# Patient Record
Sex: Female | Born: 1950
Health system: Southern US, Community
[De-identification: ages and names within clinical notes are randomized; demographics above are authoritative.]

## PROBLEM LIST (undated history)

## (undated) DIAGNOSIS — R011 Cardiac murmur, unspecified: Secondary | ICD-10-CM

## (undated) DIAGNOSIS — M810 Age-related osteoporosis without current pathological fracture: Secondary | ICD-10-CM

## (undated) DIAGNOSIS — Z862 Personal history of diseases of the blood and blood-forming organs and certain disorders involving the immune mechanism: Secondary | ICD-10-CM

## (undated) DIAGNOSIS — Z87898 Personal history of other specified conditions: Secondary | ICD-10-CM

## (undated) DIAGNOSIS — M1990 Unspecified osteoarthritis, unspecified site: Secondary | ICD-10-CM

## (undated) DIAGNOSIS — M329 Systemic lupus erythematosus, unspecified: Secondary | ICD-10-CM

## (undated) DIAGNOSIS — Z8719 Personal history of other diseases of the digestive system: Secondary | ICD-10-CM

## (undated) HISTORY — DX: Age-related osteoporosis without current pathological fracture: M81.0

## (undated) HISTORY — DX: Unspecified osteoarthritis, unspecified site: M19.90

## (undated) HISTORY — PX: BLADDER REPAIR: SHX76

## (undated) HISTORY — DX: Cardiac murmur, unspecified: R01.1

## (undated) HISTORY — DX: Personal history of other diseases of the digestive system: Z87.19

## (undated) HISTORY — DX: Personal history of diseases of the blood and blood-forming organs and certain disorders involving the immune mechanism: Z86.2

## (undated) HISTORY — DX: Systemic lupus erythematosus, unspecified: M32.9

## (undated) HISTORY — PX: ABDOMINAL HYSTERECTOMY: SHX81

## (undated) HISTORY — DX: Personal history of other specified conditions: Z87.898

---

## 2001-08-13 ENCOUNTER — Ambulatory Visit (HOSPITAL_COMMUNITY): Admission: RE | Admit: 2001-08-13 | Discharge: 2001-08-13 | Payer: Self-pay | Admitting: Internal Medicine

## 2001-08-13 ENCOUNTER — Encounter: Payer: Self-pay | Admitting: Internal Medicine

## 2002-08-08 ENCOUNTER — Other Ambulatory Visit: Admission: RE | Admit: 2002-08-08 | Discharge: 2002-08-08 | Payer: Self-pay | Admitting: Obstetrics and Gynecology

## 2002-10-16 ENCOUNTER — Emergency Department (HOSPITAL_COMMUNITY): Admission: EM | Admit: 2002-10-16 | Discharge: 2002-10-16 | Payer: Self-pay | Admitting: *Deleted

## 2003-02-04 ENCOUNTER — Other Ambulatory Visit: Admission: RE | Admit: 2003-02-04 | Discharge: 2003-02-04 | Payer: Self-pay | Admitting: Obstetrics and Gynecology

## 2003-02-18 ENCOUNTER — Other Ambulatory Visit: Admission: RE | Admit: 2003-02-18 | Discharge: 2003-02-18 | Payer: Self-pay | Admitting: Obstetrics and Gynecology

## 2003-06-10 ENCOUNTER — Ambulatory Visit (HOSPITAL_COMMUNITY): Admission: RE | Admit: 2003-06-10 | Discharge: 2003-06-10 | Payer: Self-pay | Admitting: Family Medicine

## 2003-09-07 ENCOUNTER — Encounter: Payer: Self-pay | Admitting: Family Medicine

## 2003-09-07 ENCOUNTER — Ambulatory Visit (HOSPITAL_COMMUNITY): Admission: RE | Admit: 2003-09-07 | Discharge: 2003-09-07 | Payer: Self-pay | Admitting: Internal Medicine

## 2004-06-16 ENCOUNTER — Ambulatory Visit (HOSPITAL_COMMUNITY): Admission: RE | Admit: 2004-06-16 | Discharge: 2004-06-16 | Payer: Self-pay | Admitting: Family Medicine

## 2004-06-20 ENCOUNTER — Ambulatory Visit: Payer: Self-pay | Admitting: Family Medicine

## 2004-09-28 ENCOUNTER — Ambulatory Visit: Payer: Self-pay | Admitting: Family Medicine

## 2004-10-06 ENCOUNTER — Encounter: Payer: Self-pay | Admitting: Obstetrics and Gynecology

## 2004-10-06 ENCOUNTER — Inpatient Hospital Stay (HOSPITAL_COMMUNITY): Admission: RE | Admit: 2004-10-06 | Discharge: 2004-10-07 | Payer: Self-pay | Admitting: Obstetrics and Gynecology

## 2005-04-26 ENCOUNTER — Ambulatory Visit: Payer: Self-pay | Admitting: Family Medicine

## 2005-06-23 ENCOUNTER — Ambulatory Visit (HOSPITAL_COMMUNITY): Admission: RE | Admit: 2005-06-23 | Discharge: 2005-06-23 | Payer: Self-pay | Admitting: Family Medicine

## 2005-12-28 ENCOUNTER — Ambulatory Visit: Payer: Self-pay | Admitting: Family Medicine

## 2006-06-26 ENCOUNTER — Ambulatory Visit (HOSPITAL_COMMUNITY): Admission: RE | Admit: 2006-06-26 | Discharge: 2006-06-26 | Payer: Self-pay | Admitting: Obstetrics and Gynecology

## 2007-07-12 ENCOUNTER — Ambulatory Visit (HOSPITAL_COMMUNITY): Admission: RE | Admit: 2007-07-12 | Discharge: 2007-07-12 | Payer: Self-pay | Admitting: Family Medicine

## 2007-10-01 DIAGNOSIS — E785 Hyperlipidemia, unspecified: Secondary | ICD-10-CM | POA: Insufficient documentation

## 2007-10-01 DIAGNOSIS — Z8719 Personal history of other diseases of the digestive system: Secondary | ICD-10-CM | POA: Insufficient documentation

## 2008-08-19 ENCOUNTER — Ambulatory Visit (HOSPITAL_COMMUNITY): Admission: RE | Admit: 2008-08-19 | Discharge: 2008-08-19 | Payer: Self-pay | Admitting: Family Medicine

## 2009-02-24 ENCOUNTER — Encounter: Payer: Self-pay | Admitting: Family Medicine

## 2009-08-24 ENCOUNTER — Ambulatory Visit (HOSPITAL_COMMUNITY): Admission: RE | Admit: 2009-08-24 | Discharge: 2009-08-24 | Payer: Self-pay | Admitting: Family Medicine

## 2009-08-24 ENCOUNTER — Encounter: Payer: Self-pay | Admitting: Family Medicine

## 2010-01-18 ENCOUNTER — Telehealth: Payer: Self-pay | Admitting: Family Medicine

## 2010-01-19 ENCOUNTER — Encounter: Payer: Self-pay | Admitting: Family Medicine

## 2010-02-10 ENCOUNTER — Ambulatory Visit: Payer: Self-pay | Admitting: Family Medicine

## 2010-02-10 DIAGNOSIS — M545 Low back pain, unspecified: Secondary | ICD-10-CM | POA: Insufficient documentation

## 2010-02-10 DIAGNOSIS — F329 Major depressive disorder, single episode, unspecified: Secondary | ICD-10-CM | POA: Insufficient documentation

## 2010-02-10 DIAGNOSIS — F3289 Other specified depressive episodes: Secondary | ICD-10-CM | POA: Insufficient documentation

## 2010-02-11 ENCOUNTER — Encounter: Payer: Self-pay | Admitting: Family Medicine

## 2010-02-12 ENCOUNTER — Encounter: Payer: Self-pay | Admitting: Family Medicine

## 2010-02-13 DIAGNOSIS — M1990 Unspecified osteoarthritis, unspecified site: Secondary | ICD-10-CM

## 2010-02-13 HISTORY — DX: Unspecified osteoarthritis, unspecified site: M19.90

## 2010-02-16 ENCOUNTER — Ambulatory Visit (HOSPITAL_COMMUNITY): Admission: RE | Admit: 2010-02-16 | Discharge: 2010-02-16 | Payer: Self-pay | Admitting: Family Medicine

## 2010-02-16 LAB — CONVERTED CEMR LAB
BUN: 17 mg/dL (ref 6–23)
Basophils Absolute: 0 10*3/uL (ref 0.0–0.1)
Basophils Relative: 1 % (ref 0–1)
CO2: 26 meq/L (ref 19–32)
Calcium: 9.3 mg/dL (ref 8.4–10.5)
Chloride: 105 meq/L (ref 96–112)
Cholesterol: 181 mg/dL (ref 0–200)
Creatinine, Ser: 1.14 mg/dL (ref 0.40–1.20)
Eosinophils Absolute: 0.1 10*3/uL (ref 0.0–0.7)
Eosinophils Relative: 1 % (ref 0–5)
Glucose, Bld: 91 mg/dL (ref 70–99)
HCT: 38.8 % (ref 36.0–46.0)
HDL: 30 mg/dL — ABNORMAL LOW (ref >39)
Hemoglobin: 12.3 g/dL (ref 12.0–15.0)
LDL Cholesterol: 125 mg/dL — ABNORMAL HIGH (ref 0–99)
Lymphocytes Relative: 34 % (ref 12–46)
Lymphs Abs: 1.5 10*3/uL (ref 0.7–4.0)
MCHC: 31.7 g/dL (ref 30.0–36.0)
MCV: 86 fL (ref 78.0–100.0)
Monocytes Absolute: 0.5 10*3/uL (ref 0.1–1.0)
Monocytes Relative: 10 % (ref 3–12)
Neutro Abs: 2.3 10*3/uL (ref 1.7–7.7)
Neutrophils Relative %: 53 % (ref 43–77)
Platelets: 174 10*3/uL (ref 150–400)
Potassium: 4.6 meq/L (ref 3.5–5.3)
RBC: 4.51 M/uL (ref 3.87–5.11)
RDW: 14.1 % (ref 11.5–15.5)
Sodium: 141 meq/L (ref 135–145)
TSH: 0.548 microintl units/mL (ref 0.350–4.500)
Total CHOL/HDL Ratio: 6
Triglycerides: 131 mg/dL (ref <150)
VLDL: 26 mg/dL (ref 0–40)
Vit D, 25-Hydroxy: 23 ng/mL — ABNORMAL LOW (ref 30–89)
WBC: 4.4 10*3/uL (ref 4.0–10.5)

## 2010-02-23 ENCOUNTER — Encounter: Payer: Self-pay | Admitting: Family Medicine

## 2010-03-08 ENCOUNTER — Encounter: Payer: Self-pay | Admitting: Family Medicine

## 2010-03-09 ENCOUNTER — Telehealth: Payer: Self-pay | Admitting: Family Medicine

## 2010-03-15 ENCOUNTER — Ambulatory Visit: Payer: Self-pay | Admitting: Family Medicine

## 2010-04-26 NOTE — Letter (Signed)
Summary: Historic Patient File  Historic Patient File   Imported By: Lind Guest 02/24/2009 10:05:42  _____________________________________________________________________  External Attachment:    Type:   Image     Comment:   External Document

## 2010-04-26 NOTE — Procedures (Signed)
Summary: Gastroenterology / colonoscopy  Gastroenterology / colonoscopy   Imported By: Lind Guest 02/11/2010 10:42:56  _____________________________________________________________________  External Attachment:    Type:   Image     Comment:   External Document

## 2010-04-26 NOTE — Assessment & Plan Note (Signed)
Summary: new pat   Vital Signs:  Patient profile:   60 year old female Height:      62 inches Weight:      127.50 pounds BMI:     23.40 O2 Sat:      96 % on Room air Pulse rate:   86 / minute Pulse rhythm:   regular Resp:     16 per minute BP sitting:   130 / 78  (right arm)  Vitals Entered By: Mauricia Area CMA (February 10, 2010 2:20 PM)  O2 Flow:  Room air CC: Right leg pain radiates down to foot, dizziness, nausea and vomiting, loss of appetite for past 3 weeks.   CC:  Right leg pain radiates down to foot, dizziness, nausea and vomiting, and loss of appetite for past 3 weeks.Marland Kitchen  History of Present Illness: Pt here re-establishing carfe with a main concern of uncontrolled severe low back pain radiating down leg associated with weakness and numbness, no specific aggravating factor was noted.she was evaluated and treated recentkly in the urgent care , and also had x rays done, she has had no improvement in her symptoms Reports  that prior to this she had been doing fairly well. Denies recent fever or chills. Denies sinus pressure, nasal congestion , ear pain or sore throat. Denies chest congestion, or cough productive of sputum. Denies chest pain, palpitations, PND, orthopnea or leg swelling. Denies abdominal pain, nausea, vomitting, diarrhea or constipation. Denies change in bowel movements or bloody stool. Denies dysuria , frequency, incontinence or hesitancy. Denies headaches, vertigo, seizures.  Denies  rash, lesions, or itch.     Current Medications (verified): 1)  Tramadol Hcl 50 Mg Tabs (Tramadol Hcl) .Marland Kitchen.. 1 Tab Daily  Allergies (verified): 1)  ! 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 Eyes:  Denies discharge and red eye. GI:  Complains of abdominal pain and nausea; pt has had 2 episodes of nausea and vomitting with lightheadedness in the past 3 weeks. she has had  no apetite since her husband's accident in August9, he isrecovering. MS:  Complains of low back pain, mid back pain, and muscle weakness. Neuro:  Complains of numbness and weakness. Psych:  Complains of anxiety and depression; denies mental problems, suicidal thoughts/plans, thoughts of violence, and unusual visions or sounds; social withdrawal, feels overwhelmed, poor apetite and poor sleep. Endo:  Denies cold intolerance, excessive hunger, excessive thirst, and heat intolerance. Heme:  Denies abnormal bruising and bleeding. Allergy:  Complains of seasonal allergies.  Physical Exam  General:  Well-developed,well-nourished,in no acute distress; alert,appropriate and cooperative throughout examination HEENT: No facial asymmetry,  EOMI, No sinus tenderness, TM's Clear, oropharynx  pink and moist.   Chest: Clear to auscultation bilaterally.  CVS: S1, S2, No murmurs, No S3.   Abd: Soft, Nontender.  MS: decreased  ROM spineand  hips, decreased in shoulders and knees.  Ext: No  edema.   CNS: CN 2-12 intact,reduced  power tone and sensation in right lower ext to the toes  Skin: Intact, no visible lesions or rashes.  Psych: Good eye contact, normal affect.  Memory intact, not anxious or depressed appearing.  Msk:  wasting of right thigh an reduced sensation   Impression & Recommendations:  Problem # 1:  DEPRESSION (ICD-311) Assessment Comment Only  Her updated medication list for this problem includes:     Paroxetine Hcl 10 Mg Tabs (Paroxetine hcl) .Marland Kitchen... Take 1 tablet by mouth once a day  Discussed treatment options, including trial of antidpressant medication. Will refer to behavioral health. Follow-up call in in 24-48 hours and recheck in 2 weeks, sooner as needed. Patient agrees to call if any worsening of symptoms or thoughts of doing harm arise. Verified that the patient has no suicidal ideation at this time.   Problem # 2:  BACK PAIN, LUMBAR, WITH RADICULOPATHY (ICD-724.4) Assessment: Deteriorated  Her updated medication list for this problem includes:    Tramadol Hcl 50 Mg Tabs (Tramadol hcl) .Marland Kitchen... 1 tab daily    Ibuprofen 600 Mg Tabs (Ibuprofen) .Marland Kitchen... Take 1 tablet by mouth two times a day  Orders: Depo- Medrol 80mg  (J1040) Ketorolac-Toradol 15mg  (Z6109) Admin of Therapeutic Inj  intramuscular or subcutaneous (60454) Radiology Referral (Radiology) Neurosurgeon Referral (Neurosurgeon)  Complete Medication List: 1)  Tramadol Hcl 50 Mg Tabs (Tramadol hcl) .Marland Kitchen.. 1 tab daily 2)  Ibuprofen 600 Mg Tabs (Ibuprofen) .... Take 1 tablet by mouth two times a day 3)  Paroxetine Hcl 10 Mg Tabs (Paroxetine hcl) .... Take 1 tablet by mouth once a day  Other Orders: T-Basic Metabolic Panel [phone]) T-CBC w/Diff [phone]) T-Lipid Profile [phone]) T-TSH [phone]) T-Vitamin D (25-Hydroxy) [phone])  Patient Instructions: 1)  Please schedule a follow-up appointment in 1 month to 5 weeks. 2)  Fasting labs asap. 3)  You will be referred for MRI of your spine, and will receive  2 injections  in the office. New med for depression Prescriptions: PAROXETINE HCL 10 MG TABS (PAROXETINE HCL) Take 1 tablet by mouth once a day  #30 x 1   Entered and Authorized by:   Syliva Overman MD   Signed by:   Syliva Overman MD on 02/10/2010   Method used:   Electronically to        [facility]* (retail)       [address] [place], [place]   [address]       Ph: [phone]       Fax: [phone]   RxID:   [id number] IBUPROFEN 600 MG TABS (IBUPROFEN) Take 1 tablet by mouth two times a day  #30 x 0   Entered and Authorized by:   Syliva Overman MD   Signed by:   Syliva Overman MD on 02/10/2010   Method used:   Electronically to        [facility]* (retail)       [address]       [place], [place]  [address]       Ph: [phone]       Fax: [phone]   RxID:   [id number]    Medication Administration  Injection # 1:    Medication: Depo- Medrol 80mg     Diagnosis: BACK PAIN, LUMBAR, WITH RADICULOPATHY (ICD-724.4)    Route: IM  Site: RUOQ gluteus    Exp Date: 07/12    Lot #: [id number]    Mfr: Pharmacia    Patient tolerated injection without complications    Given by: Adella Hare LPN (February 10, 2010 3:43 PM)  Injection # 2:    Medication: Ketorolac-Toradol 15mg     Diagnosis: BACK PAIN, LUMBAR, WITH RADICULOPATHY (ICD-724.4)    Route: IM    Site: LUOQ gluteus    Exp Date: 01/26/2011    Lot #: [id number]    Mfr: [email or web address]    Comments: toradol 60mg  given    Patient tolerated injection without complications    Given by: Adella Hare LPN (February 10, 2010 3:43 PM)  Orders Added: 1)  New Patient Level IV [99204] 2)  T-Basic Metabolic Panel [[id number]] 3)  T-CBC w/Diff [[id number]] 4)  T-Lipid Profile [80061-22930] 5)  T-TSH [02542-70623] 6)  T-Vitamin D (25-Hydroxy) [76283-15176] 7)  Depo- Medrol 80mg  [J1040] 8)  Ketorolac-Toradol 15mg  [J1885] 9)  Admin of Therapeutic Inj  intramuscular or subcutaneous [96372] 10)  Radiology Referral [Radiology] 11)  Neurosurgeon Referral [Neurosurgeon]     Medication Administration  Injection # 1:    Medication: Depo- Medrol 80mg     Diagnosis: BACK PAIN, LUMBAR, WITH RADICULOPATHY (ICD-724.4)    Route: IM    Site: RUOQ gluteus    Exp Date: 07/12    Lot #: [id number]    Mfr: Pharmacia    Patient tolerated injection without  complications    Given by: Adella Hare LPN (February 10, 2010 3:43 PM)  Injection # 2:    Medication: Ketorolac-Toradol 15mg     Diagnosis: BACK PAIN, LUMBAR, WITH RADICULOPATHY (ICD-724.4)    Route: IM    Site: LUOQ gluteus    Exp Date: 01/26/2011    Lot #: [id number]    Mfr: [email or web address]    Comments: toradol 60mg  given    Patient tolerated injection without complications    Given by: Adella Hare LPN (February 10, 2010 3:43 PM)  Orders Added: 1)  New Patient Level IV [99204] 2)  T-Basic Metabolic Panel [80048-22910] 3)  T-CBC w/Diff [06269-48546] 4)  T-Lipid Profile [80061-22930] 5)  T-TSH [27035-00938] 6)  T-Vitamin D (25-Hydroxy) [18299-37169] 7)  Depo- Medrol 80mg  [J1040] 8)  Ketorolac-Toradol 15mg  [J1885] 9)  Admin of Therapeutic Inj  intramuscular or subcutaneous [96372] 10)  Radiology Referral [Radiology] 11)  Neurosurgeon Referral [Neurosurgeon]

## 2010-04-26 NOTE — Letter (Signed)
Summary: [facility]  [facility] with [facility]   Imported By: Curtis Sites 02/24/2010 08:18:17  _____________________________________________________________________  External Attachment:    Type:   Image     Comment:   External Document

## 2010-04-26 NOTE — Progress Notes (Signed)
Summary: OLD PATIENT  Phone Note Call from Patient   Summary of Call: HAS NOT BEEN SEEN SINCE 2007 AND THINKS SHE IS STILL A PTIENT HERE I TOLD HER SHE WOULD BE CONSIDERED A NEW PATIENT SHE WANTS TO TALK TO YOU CALL AT [phone] Initial call taken by: Lind Guest,  January 18, 2010 10:51 AM  Follow-up for Phone Call        std protocol is after3 yrs new, offer appt when available as new she does not have many health probs, no more tnhan 2 new pts per week, when ttthere are openings and no more than one on any day pls Follow-up by: Syliva Overman MD,  January 18, 2010 12:41 PM  Additional Follow-up for Phone Call Additional follow up Details #1::        appt. 11.17.11 Additional Follow-up by: Lind Guest,  January 18, 2010 1:08 PM

## 2010-04-26 NOTE — Letter (Signed)
[facility]   Imported By: Lind Guest 02/11/2010 08:51:14  _____________________________________________________________________  External Attachment:    Type:   Image     Comment:   External Document

## 2010-04-26 NOTE — Letter (Signed)
Summary: Historic Patient File  Historic Patient File   Imported By: Lind Guest 02/11/2010 14:53:04  _____________________________________________________________________  External Attachment:    Type:   Image     Comment:   External Document

## 2010-04-28 NOTE — Letter (Signed)
[facility]   Imported By: Lind Guest 03/23/2010 12:59:35  _____________________________________________________________________  External Attachment:    Type:   Image     Comment:   External Document

## 2010-04-28 NOTE — Progress Notes (Signed)
Summary: [name].600 mg  Phone Note Call from Patient   Summary of Call: needs her [name]. 600 mg [facility] of [facility]  Initial call taken by: Lind Guest,  March 09, 2010 10:16 AM    Prescriptions: IBUPROFEN 600 MG TABS (IBUPROFEN) Take 1 tablet by mouth two times a day  #30 x 0   Entered by:   Adella Hare LPN   Authorized by:   Syliva Overman MD   Signed by:   Adella Hare LPN on 04/54/0981   Method used:   Electronically to        [facility] [address] [facility] (retail)       [address]       [place], [place]  [address]       Ph: [phone]       Fax: [phone]   RxID:   [id number]

## 2010-06-10 ENCOUNTER — Encounter: Payer: Self-pay | Admitting: Family Medicine

## 2010-06-10 ENCOUNTER — Ambulatory Visit (INDEPENDENT_AMBULATORY_CARE_PROVIDER_SITE_OTHER): Payer: BC Managed Care – PPO | Admitting: Family Medicine

## 2010-06-10 DIAGNOSIS — E785 Hyperlipidemia, unspecified: Secondary | ICD-10-CM

## 2010-06-10 DIAGNOSIS — F3289 Other specified depressive episodes: Secondary | ICD-10-CM

## 2010-06-10 DIAGNOSIS — IMO0002 Reserved for concepts with insufficient information to code with codable children: Secondary | ICD-10-CM

## 2010-06-10 DIAGNOSIS — F329 Major depressive disorder, single episode, unspecified: Secondary | ICD-10-CM

## 2010-06-23 NOTE — Assessment & Plan Note (Signed)
Summary: f up   Vital Signs:  Patient profile:   60 year old female Height:      62 inches Weight:      131 pounds BMI:     24.05 O2 Sat:      98 % on Room air Pulse rate:   68 / minute Resp:     16 per minute BP sitting:   130 / 70  (left arm)  Vitals Entered By: Adella Hare LPN (June 10, 2010 10:07 AM)  O2 Flow:  Room air CC: follow-up visit Is Patient Diabetic? No Comments patient has not started paroxetine yet and only ibuprofen as needed   CC:  follow-up visit.  History of Present Illness: Reports  that t bshe is much better. She did not need epidurals or surgical intervention for her acute back pain Still has residual dull pain daily.. Denies recent fever or chills. Denies sinus pressure, nasal congestion , ear pain or sore throat. Denies chest congestion, or cough productive of sputum. Denies chest pain, palpitations, PND, orthopnea or leg swelling. Denies abdominal pain, nausea, vomitting, diarrhea or constipation. Denies change in bowel movements or bloody stool. Denies dysuria , frequency, incontinence or hesitancy.  Denies headaches, vertigo, seizures.  Denies  rash, lesions, or itch. reports less tearfullness and depression,never took paxil, c/o excessive anger and hates her home.Willing to seek counselling through her job.     Current Medications (verified): 1)  Ibuprofen 600 Mg Tabs (Ibuprofen) .... Take 1 Tablet By Mouth Two Times A Day 2)  Paroxetine Hcl 10 Mg Tabs (Paroxetine Hcl) .... Take 1 Tablet By Mouth Once A Day  Allergies (verified): 1)  ! Sulfa  Review of Systems      See HPI General:  Complains of fatigue. Eyes:  Denies blurring and discharge. MS:  Complains of low back pain and mid back pain. Psych:  Complains of depression and irritability; denies mental problems, suicidal thoughts/plans, thoughts of violence, and unusual visions or sounds. Endo:  Denies cold intolerance, excessive hunger, excessive thirst, and excessive  urination. Heme:  Denies abnormal bruising and bleeding. Allergy:  Denies hives or rash and itching eyes.  Physical Exam  General:  Well-developed,well-nourished,in no acute distress; alert,appropriate and cooperative throughout examination HEENT: No facial asymmetry,  EOMI, No sinus tenderness, TM's Clear, oropharynx  pink and moist.   Chest: Clear to auscultation bilaterally.  CVS: S1, S2, No murmurs, No S3.   Abd: Soft, Nontender.  MS: Adequate ROM spine, hips, shoulders and knees.  Ext: No edema.   CNS: CN 2-12 intact, power tone and sensation normal throughout.   Skin: Intact, no visible lesions or rashes.  Psych: Good eye contact, normal affect.  Memory intact, not anxious or depressed appearing.    Impression & Recommendations:  Problem # 1:  DEPRESSION (ICD-311) Assessment Improved  The following medications were removed from the medication list:    Paroxetine Hcl 10 Mg Tabs (Paroxetine hcl) .Marland Kitchen... Take 1 tablet by mouth once a day pt strongly recommended to seek theapy  Problem # 2:  DYSLIPIDEMIA (ICD-272.4) Assessment: Comment Only    HDL:30 (02/12/2010)  LDL:125 (02/12/2010)  Chol:181 (02/12/2010)  Trig:131 (02/12/2010) Low fat dietdiscussed and encouraged  Problem # 3:  BACK PAIN, LUMBAR, WITH RADICULOPATHY (ICD-724.4) Assessment: Improved  The following medications were removed from the medication list:    Tramadol Hcl 50 Mg Tabs (Tramadol hcl) .Marland Kitchen... 1 tab daily    Ibuprofen 600 Mg Tabs (Ibuprofen) .Marland Kitchen... Take 1 tablet by mouth two  times a day Her updated medication list for this problem includes:    Ibuprofen 600 Mg Tabs (Ibuprofen) .Marland Kitchen... Take 1 tablet by mouth once a day as needed for severe pain  Complete Medication List: 1)  Ibuprofen 600 Mg Tabs (Ibuprofen) .... Take 1 tablet by mouth once a day as needed for severe pain  Patient Instructions: 1)  CPE and pap in 6 months. 2)  Keep the exercise up. 3)  you need to start Calcium 1200mg /vit D 1000iU  gelcap one daily (OTC) 4)  You  need one multivatimn one daily 5)  Asprin 81mg  one daily for heart protection 6)  [place] due in May pls sched inApril. Prescriptions: IBUPROFEN 600 MG TABS (IBUPROFEN) Take 1 tablet by mouth once a day as needed for severe pain  #60 x 0   Entered and Authorized by:   Syliva Overman MD   Signed by:   Syliva Overman MD on 06/10/2010   Method used:   Electronically to        [facility] (retail)       [address]       [place], [place]  [address]       Ph: [phone]       Fax: [phone]   RxID:   [id number]    Orders Added: 1)  Est. Patient Level IV [41660]

## 2010-08-12 NOTE — Op Note (Signed)
[name], [name]                         ACCOUNT NO.:  [id number]   MEDICAL RECORD NO.:  [id number]                   PATIENT TYPE:  AMB   LOCATION:  DAY                                  FACILITY:  [facility]   PHYSICIAN:  R. Roetta Sessions, M.D.              DATE OF BIRTH:  [birth year]   DATE OF PROCEDURE:  09/07/2003  DATE OF DISCHARGE:                                 OPERATIVE REPORT   PROCEDURE:  Screening colonoscopy.   INDICATIONS FOR PROCEDURE:  The patient is a 60 year old lady devoid of any  GI tract symptoms.  There is no family history of colorectal neoplasia.  She  has never had her colon imaged previously.  Colonoscopy is now being done as  a screening maneuver.  This approach has been discussed with the patient.  The potential risks, benefits, and alternatives have been reviewed and  questions answered.  Please see my handwritten H&P for more information.   PROCEDURE:  O2 saturation, blood pressure, pulses, and respirations were  monitored throughout the entirety of the procedure.  Conscious sedation was  with Versed 3 mg IV, Demerol 75 mg IV in divided doses.  The instrument used  was the Olympus video chip system.   FINDINGS:  Digital rectal examination revealed no abnormalities.   ENDOSCOPIC FINDINGS:  The prep was excellent.   Rectum:  Examination of the rectal mucosa revealed no abnormalities.  The  patient is small-framed and has a small rectal vault.  I was unable to  retroflex using the pediatric scope; however, the rectal mucosa was seen  well en face and appeared normal.   Colon:  The colonic mucosa was surveyed from the rectosigmoid junction  through the left, transverse, right colon to the area of the appendiceal  orifice, ileocecal valve, and cecum.  These structures were well-seen and  photographed for the record.  From this level, the scope was slowly  withdrawn.  All previously mentioned mucosal surfaces were again seen.  The  patient had pancolonic  diverticular all the way to the cecum and a couple of  areas of focally pigmented mucosa throughout the sigmoid colon of doubtful  clinical significance.  The remainder of the colonic mucosa appeared normal.  The patient tolerated the procedure well and was reactive in endoscopy.   IMPRESSION:  1. Normal rectum.  2. Pancolonic diverticula and focal area of pigmented left colon mucosa of     doubtful clinical significance.   RECOMMENDATIONS:  1. Diverticulosis literature provided to [name].  2. Repeat colonoscopy in 10 years.      ___________________________________________                                            [name], M.D.   RMR/MEDQ  D:  09/07/2003  T:  09/07/2003  Job:  [id number]

## 2010-08-12 NOTE — H&P (Signed)
NAMEKRISSA, [name]               ACCOUNT NO.:  [id number]   MEDICAL RECORD NO.:  [id number]          PATIENT TYPE:  AMB   LOCATION:  DAY                           FACILITY:  [facility]   PHYSICIAN:  Tilda Burrow, M.D. DATE OF BIRTH:  25-Dec-1950   DATE OF ADMISSION:  DATE OF DISCHARGE:  LH                                HISTORY & PHYSICAL   ADMITTING DIAGNOSES:  Cystocele.  First-degree uterine descensus.  Residual  cervical dysplasia status post LEEP.   HISTORY OF PRESENT ILLNESS:  This 60 year old post menopausal female is  admitted for vaginal hysterectomy, anterior repair, for complaints of  bladder dropping.  The patient is concerned that the bladder has been  dropping since monitored over the past year in our office.  She has  complaints of post coital urinary tract infections, as well as deep thrust  dyspareunia.  She denies stress and urge incontinence.  She has nocturia x3.  She has no sense of incomplete evacuation, but evaluation and exam shows a  significant cystocele with uterus dropping as well.  She has had a history  of cervical dysplasia.  Had a LEEP conization performed November 2004 which  showed moderate to high grade dysplasia involving endocervical gland and  extending to at least one cervical margin.  She has had repeat colposcopy  which did not identify residual dysplasia, but the concern is nonetheless  raised.  Therefore, our plans are for vaginal hysterectomy combined with  anterior repair.  She denies bowel function difficulties.   REVIEW OF SYSTEMS:  Negative for nausea, vomiting, fever, chills, and  positive for lower abdominal pelvic ache and the complaints of dyspareunia  mentioned earlier.   During preop, we have had a generous discussion over pros and cons of  ovarian preservation.  She desires the ovaries to be left in place unless  specific visible abnormalities are identified.  The 1 and 100 risk of  ovarian tumors after post menopausal age  group has been reached.  It has  been discussed with her.  She is very comfortable with ovarian preservation  hormone management issues have been discussed at length to patient's  satisfaction.   PAST MEDICAL HISTORY:  Essentially benign except for lupus erythematosus,  not currently active, is very mild.   PAST SURGICAL HISTORY:  Tubal ligation, 1984.   ALLERGIES:  SULFA DRUGS.   MEDICATIONS:  None taken at present.   PHYSICAL EXAMINATION:  GENERAL:  Shows a healthy active, alert, oriented x3,  African-American female.  HEENT:  Pupils equal, round, reactive.  NECK:  Supple.  Trachea is midline.  CARDIOVASCULAR:  Exam unremarkable.  ABDOMEN:  Slim, without masses.  GENITALIA:  External genitalia normal.  Multiparous female.  Atrophic  vaginal tissues with stenotic cervix status post LEEP.  First-degree uterine  descensus and cystocele present.  Posterior support is quite good.   PLAN:  Vaginal hysterectomy, anterior repair on October 06, 2004.       JVF/MEDQ  D:  10/04/2004  T:  10/04/2004  Job:  [id number]   cc:   [facility]   Pattricia Boss  [facility]   Milus Mallick. Lodema Hong, M.D.  [address]  [place], [place] [address]  Fax: [phone]

## 2010-08-12 NOTE — Op Note (Signed)
[name], [name]               ACCOUNT NO.:  [id number]   MEDICAL RECORD NO.:  [id number]          PATIENT TYPE:  AMB   LOCATION:  DAY                           FACILITY:  [facility]   PHYSICIAN:  Tilda Burrow, M.D. DATE OF BIRTH:  02-16-1951   DATE OF PROCEDURE:  10/06/2004  DATE OF DISCHARGE:                                 OPERATIVE REPORT   PREOPERATIVE DIAGNOSIS:  Cystocele, first degree uterine dehiscence.   POSTOPERATIVE DIAGNOSIS:  Cystocele, first degree uterine dehiscence.   PROCEDURE:  Vaginal hysterectomy, anterior and posterior repair.   SURGEON:  Tilda Burrow, M.D.   ASSISTANTAmie Critchley, CST, [place], [place].   ANESTHESIA:  General.   COMPLICATIONS:  None.   FINDINGS:  Very thin anterior vaginal mucosa, atrophic, tiny uterus.  Laxity  of very thin perineal body warranting improvement of perineum to support the  thin anterior tissues.   DETAILS OF PROCEDURE:  The patient was taken to the operating room prepped  and draped in the usual fashion for vaginal procedure with Flowtron leg  supports in place and the legs supported in high lithotomy supports.  The  patient had easy access to the cervix with large bulging cystocele to the  introitus.  After the cervix was circumscribed using knife dissection, it  was easy to identify the anterior peritoneal reflection between the bladder  and the uterus.  We were able to enter the peritoneum anteriorly.  The  uterus was quite tiny, flexible, and able to be flipped forward through the  incision.  We then proceeded with hysterectomy in Doderlein technique.  This  consisted of grasping the uterine fundus with Lahey powered tenaculum,  placing a curved zeppelin clamp across the utero-ovarian ligament, round  ligament, and fallopian tube complex transecting it and ligating the pedicle  with 0-chromic and tagging it.  This was performed bilaterally on both sides  and then we marched down the broad ligament clamping, cutting,  and suture-  ligating using zeppelin clamp, Metzenbaum scissors, and 0-suture ligature.  Upon reaching the lower cardinal ligament, we were able to cross clamp the  lower cardinal ligament and uterosacral ligament with zeppelin clamps from  each side, excising the cervix and uterus completely.  We then proceeded  with placing a 2-0 Prolene stitch pulling the uterosacral ligaments closer  together, approximately 1 cm above the cuff edge.  This was placed in  position but not tied down until inspection of the anterior pedicles  confirming the hemostasis releasing them, and then closing the peritoneum  using transverse, running, 2-0 chromic suture.  The uterosacral ligaments  were then pulled together with tying down the 2-0 Prolene stitch and then  pulling the uterosacral ligament pedicles together in the midline.  This  resulted in satisfactory vaginal apex support.  There was some reduction of  the vaginal apex diameter with this procedure but two fingers were able to  reach full vaginal length.  The patient has naturally short vaginal length  noted preoperatively.   We then proceeded with inspection of the anterior vaginal tissues.  Anterior  repair proceeded by  splitting the vaginal mucosa beneath the cystocele  peeling the bladder off the underlying vaginal tissues and identifying  satisfactorily supported tissue on each side of the cystocele that could be  pulled in the midline and tied with interrupted, horizontal mattress sutures  of 0-Dexon.  We then proceeded to trim the redundant vaginal mucosa  anteriorly and reapproximated with a series of interrupted 2-0 chromic  sutures.  The patient tolerated the procedure well.  At this time, we were  able to inspect the perineum and it was apparent that the laxity of the  posterior introitus would give less than optimal support to the newly  repaired bladder repair, so, in the area of the prior weakened defect from  our old episiotomy, we  opened the posterior perineal body from a distance of  approximately 2 cm which allowed a dissection laterally so that three  interrupted 0-Dexon sutures could be placed to pull the bulbocavernosus  muscles closer to the midline and reduce introitus size.  Again, two fingers  were easily permissible to enter through the newly reinforced posterior  perineal body.  Two-0 chromic closure of the skin edges resulted in  continuing.  The patient had vaginal packing and Foley catheter was a 12-  [place] Foley catheter inserted.  A 12-French was used because she could not  allow introduction of a standard [place] due to the small urethral introitus  entrance size.       JVF/MEDQ  D:  10/06/2004  T:  10/06/2004  Job:  [id number]

## 2010-08-12 NOTE — Discharge Summary (Signed)
NAMEMIAYA, LAFONTANT               ACCOUNT NO.:  [id number]   MEDICAL RECORD NO.:  [id number]          PATIENT TYPE:  INP   LOCATION:  A417                          FACILITY:  [facility]   PHYSICIAN:  Tilda Burrow, M.D. DATE OF BIRTH:  1950-06-18   DATE OF ADMISSION:  10/06/2004  DATE OF DISCHARGE:  07/14/2006LH                                 DISCHARGE SUMMARY   ADMITTING DIAGNOSIS:  Cystocele, first degree uterine descensus, residual  cervical dysplasia, status post loop electrocautery excision procedure  conization of the cervix.   DISCHARGE DIAGNOSIS:  Cystocele, first degree uterine descensus, residual  cervical dysplasia, status post loop electrocautery excision procedure  conization of the cervix.   PROCEDURE:  Vaginal hysterectomy, anterior repair.   SURGEON:  Tilda Burrow, M.D.   DISCHARGE MEDICATIONS:  Tylox 1 every 4 hours p.r.n. pain.   HOSPITAL SUMMARY:  This 60 year old female with first degree uterine  descensus, residual cervical dysplasia based on margins of the cervix,  enlarged cystocele was taken to the operating room for a small pear-shaped  hysterectomy and anterior repair.  Pathology removed a tiny uterus, 22 grams  with smooth surface.  The cervix showed moderate residual dysplasia of the  cervix.  The vaginal mucosa of the anterior wall showed, mild thick  inflammation and thickening.  The procedure was uneventful and the patient  was discharged home on postoperative day #1, tolerating a regular diet with  postop hemoglobin of 10.5 and hematocrit 30.3 compared to 12.4 and 36.5,  respectively during preop assessment.  Routine follow up in 2 weeks, then 4  weeks.      Tilda Burrow, M.D.  Electronically Signed     JVF/MEDQ  D:  11/22/2004  T:  11/23/2004  Job:  [id number]

## 2010-09-01 ENCOUNTER — Other Ambulatory Visit: Payer: Self-pay | Admitting: Family Medicine

## 2010-09-01 DIAGNOSIS — Z139 Encounter for screening, unspecified: Secondary | ICD-10-CM

## 2010-09-02 ENCOUNTER — Ambulatory Visit (HOSPITAL_COMMUNITY)
Admission: RE | Admit: 2010-09-02 | Discharge: 2010-09-02 | Disposition: A | Discharge status: Home / Self Care | Payer: BC Managed Care – PPO | Source: Ambulatory Visit | Attending: Family Medicine | Admitting: Family Medicine

## 2010-09-02 DIAGNOSIS — Z139 Encounter for screening, unspecified: Secondary | ICD-10-CM

## 2010-09-02 DIAGNOSIS — Z1231 Encounter for screening mammogram for malignant neoplasm of breast: Secondary | ICD-10-CM | POA: Insufficient documentation

## 2010-12-09 ENCOUNTER — Encounter: Payer: Self-pay | Admitting: Family Medicine

## 2010-12-12 ENCOUNTER — Other Ambulatory Visit (HOSPITAL_COMMUNITY)
Admission: RE | Admit: 2010-12-12 | Discharge: 2010-12-12 | Disposition: A | Discharge status: Home / Self Care | Payer: BC Managed Care – PPO | Source: Ambulatory Visit | Attending: Family Medicine | Admitting: Family Medicine

## 2010-12-12 ENCOUNTER — Encounter: Payer: Self-pay | Admitting: Family Medicine

## 2010-12-12 ENCOUNTER — Ambulatory Visit (INDEPENDENT_AMBULATORY_CARE_PROVIDER_SITE_OTHER): Payer: BC Managed Care – PPO | Admitting: Family Medicine

## 2010-12-12 VITALS — BP 140/90 | HR 82 | Resp 16 | Ht 62.0 in | Wt 132.0 lb

## 2010-12-12 DIAGNOSIS — Z1211 Encounter for screening for malignant neoplasm of colon: Secondary | ICD-10-CM

## 2010-12-12 DIAGNOSIS — IMO0002 Reserved for concepts with insufficient information to code with codable children: Secondary | ICD-10-CM

## 2010-12-12 DIAGNOSIS — Z124 Encounter for screening for malignant neoplasm of cervix: Secondary | ICD-10-CM

## 2010-12-12 DIAGNOSIS — Z0000 Encounter for general adult medical examination without abnormal findings: Secondary | ICD-10-CM

## 2010-12-12 DIAGNOSIS — Z01419 Encounter for gynecological examination (general) (routine) without abnormal findings: Secondary | ICD-10-CM | POA: Insufficient documentation

## 2010-12-12 DIAGNOSIS — E785 Hyperlipidemia, unspecified: Secondary | ICD-10-CM

## 2010-12-12 LAB — POC HEMOCCULT BLD/STL (OFFICE/1-CARD/DIAGNOSTIC): Fecal Occult Blood, POC: NEGATIVE

## 2010-12-12 NOTE — Patient Instructions (Signed)
CPE in 1 year.  You are in great health!  Continue regular exercise, and healthy eating habits.  Please try and improve on your sleep, we will provide a sheet on good sleep hygiene (feel free to share the info!)  Flu vaccine today.  Pls take One  Multivitamin daily  One 81mg  aspirin daily.  One calcium with D 1200mg /1000IU gel cap oncedaily  Fasting labs through your job, and please drop off the results.  Pls call if you need to see me!

## 2010-12-12 NOTE — Progress Notes (Signed)
  Subjective:    Patient ID: [name], female    DOB: 05-Oct-1950, 60 y.o.   MRN: [id number]  HPI The PT is here for annual exam.  Preventive health is updated, specifically  Cancer screening and Immunization.   There are no new concerns.  There are no specific complaints, except for occasional back pain which responds to ibuprofen.       Review of Systems See HPI Denies recent fever or chills. Denies sinus pressure, nasal congestion, ear pain or sore throat. Denies chest congestion, productive cough or wheezing. Denies chest pains, palpitations and leg swelling Denies abdominal pain, nausea, vomiting,diarrhea or constipation.   Denies dysuria, frequency, hesitancy or incontinence. Denies joint pain, swelling and limitation in mobility. Denies headaches, seizures, numbness, or tingling. Denies depression, anxiety or insomnia. Denies skin break down or rash.        Objective:   Physical Exam   Pleasant well nourished female, alert and oriented x 3, in no cardio-pulmonary distress. Afebrile. HEENT No facial trauma or asymetry. Sinuses non tender.  EOMI, PERTL, fundoscopic exam is normal, no hemorhage or exudate.  External ears normal, tympanic membranes clear. Oropharynx moist, no exudate, good dentition. Neck: supple, no adenopathy,JVD or thyromegaly.No bruits.  Chest: Clear to ascultation bilaterally.No crackles or wheezes. Non tender to palpation  Breast: No asymetry,no masses. No nipple discharge or inversion. No axillary or supraclavicular adenopathy  Cardiovascular system; Heart sounds normal,  S1 and  S2 ,no S3.  No murmur, or thrill. Apical beat not displaced Peripheral pulses normal.  Abdomen: Soft, non tender, no organomegaly or masses. No bruits. Bowel sounds normal. No guarding, tenderness or rebound.  Rectal:  No mass. Guaiac negative stool.  GU: External genitalia normal. No lesions. Vaginal canal normal.No discharge. Uterusabsent,  no adnexal masses, no  adnexal tenderness.  Musculoskeletal exam: Full ROM of spine, hips , shoulders and knees. No deformity ,swelling or crepitus noted. No muscle wasting or atrophy.   Neurologic: Cranial nerves 2 to 12 intact. Power, tone ,sensation and reflexes normal throughout. No disturbance in gait. No tremor.  Skin: Intact, no ulceration, erythema , scaling or rash noted. Pigmentation normal throughout  Psych; Normal mood and affect. Judgement and concentration normal     Assessment & Plan:

## 2010-12-12 NOTE — Assessment & Plan Note (Signed)
Low fat diet discussed and encouraged, no meds prescribed, f/u on labs

## 2010-12-12 NOTE — Assessment & Plan Note (Signed)
Improved, no recent flares 

## 2011-09-11 ENCOUNTER — Other Ambulatory Visit: Payer: Self-pay | Admitting: Family Medicine

## 2011-09-11 DIAGNOSIS — Z139 Encounter for screening, unspecified: Secondary | ICD-10-CM

## 2011-09-18 ENCOUNTER — Ambulatory Visit (HOSPITAL_COMMUNITY)
Admission: RE | Admit: 2011-09-18 | Discharge: 2011-09-18 | Disposition: A | Discharge status: Home / Self Care | Payer: BC Managed Care – PPO | Source: Ambulatory Visit | Attending: Family Medicine | Admitting: Family Medicine

## 2011-09-18 DIAGNOSIS — Z139 Encounter for screening, unspecified: Secondary | ICD-10-CM

## 2011-09-18 DIAGNOSIS — Z1231 Encounter for screening mammogram for malignant neoplasm of breast: Secondary | ICD-10-CM | POA: Insufficient documentation

## 2011-09-21 ENCOUNTER — Other Ambulatory Visit: Payer: Self-pay

## 2011-09-21 DIAGNOSIS — E785 Hyperlipidemia, unspecified: Secondary | ICD-10-CM

## 2011-09-21 DIAGNOSIS — R5383 Other fatigue: Secondary | ICD-10-CM

## 2011-09-21 DIAGNOSIS — R5381 Other malaise: Secondary | ICD-10-CM

## 2011-10-09 LAB — LIPID PANEL
Cholesterol: 158 mg/dL (ref 0–200)
HDL: 29 mg/dL — ABNORMAL LOW (ref >39)
LDL Cholesterol: 81 mg/dL (ref 0–99)
Total CHOL/HDL Ratio: 5.4 Ratio
Triglycerides: 239 mg/dL — ABNORMAL HIGH (ref <150)
VLDL: 48 mg/dL — ABNORMAL HIGH (ref 0–40)

## 2011-10-09 LAB — BASIC METABOLIC PANEL
BUN: 19 mg/dL (ref 6–23)
CO2: 25 mEq/L (ref 19–32)
Calcium: 9.6 mg/dL (ref 8.4–10.5)
Chloride: 107 mEq/L (ref 96–112)
Creat: 1.1 mg/dL (ref 0.50–1.10)
Glucose, Bld: 90 mg/dL (ref 70–99)
Potassium: 4.6 mEq/L (ref 3.5–5.3)
Sodium: 141 mEq/L (ref 135–145)

## 2011-10-09 LAB — CBC WITH DIFFERENTIAL/PLATELET
Basophils Absolute: 0 10*3/uL (ref 0.0–0.1)
Basophils Relative: 1 % (ref 0–1)
Eosinophils Absolute: 0.1 10*3/uL (ref 0.0–0.7)
Eosinophils Relative: 2 % (ref 0–5)
HCT: 37.9 % (ref 36.0–46.0)
Hemoglobin: 12.1 g/dL (ref 12.0–15.0)
Lymphocytes Relative: 45 % (ref 12–46)
Lymphs Abs: 1.8 10*3/uL (ref 0.7–4.0)
MCH: 26.8 pg (ref 26.0–34.0)
MCHC: 31.9 g/dL (ref 30.0–36.0)
MCV: 84 fL (ref 78.0–100.0)
Monocytes Absolute: 0.4 10*3/uL (ref 0.1–1.0)
Monocytes Relative: 11 % (ref 3–12)
Neutro Abs: 1.7 10*3/uL (ref 1.7–7.7)
Neutrophils Relative %: 41 % — ABNORMAL LOW (ref 43–77)
Platelets: 176 10*3/uL (ref 150–400)
RBC: 4.51 MIL/uL (ref 3.87–5.11)
RDW: 15 % (ref 11.5–15.5)
WBC: 3.9 10*3/uL — ABNORMAL LOW (ref 4.0–10.5)

## 2011-10-09 LAB — TSH: TSH: 1.535 u[IU]/mL (ref 0.350–4.500)

## 2011-10-10 ENCOUNTER — Ambulatory Visit (INDEPENDENT_AMBULATORY_CARE_PROVIDER_SITE_OTHER): Payer: BC Managed Care – PPO | Admitting: Family Medicine

## 2011-10-10 ENCOUNTER — Encounter: Payer: Self-pay | Admitting: Family Medicine

## 2011-10-10 VITALS — BP 120/82 | HR 73 | Resp 16 | Ht 62.0 in | Wt 128.4 lb

## 2011-10-10 DIAGNOSIS — E785 Hyperlipidemia, unspecified: Secondary | ICD-10-CM

## 2011-10-10 LAB — VITAMIN D 25 HYDROXY (VIT D DEFICIENCY, FRACTURES): Vit D, 25-Hydroxy: 35 ng/mL (ref 30–89)

## 2011-10-10 NOTE — Progress Notes (Signed)
  Subjective:    Patient ID: Windy Canny, female    DOB: [birth year], 61 y.o.   MRN: [id number]  HPI The PT is here for follow up and  and review of any available recent lab and radiology data.  Preventive health is updated, specifically  Cancer screening and Immunization.   Needs eye exam She is essentially very healthy with regular exercise routine and is an ideal body weight. She had scheduled for pelvic and breast exam but this is not currently due There are no new concerns.  There are no specific complaints       Review of Systems See HPI Denies recent fever or chills. Denies sinus pressure, nasal congestion, ear pain or sore throat. Denies chest congestion, productive cough or wheezing. Denies chest pains, palpitations and leg swelling Denies abdominal pain, nausea, vomiting,diarrhea or constipation.   Denies dysuria, frequency, hesitancy or incontinence. Denies joint pain, swelling and limitation in mobility. Denies headaches, seizures, numbness, or tingling. Denies depression, anxiety or significant  Insomnia, this is still not the best Denies skin break down or rash.        Objective:   Physical Exam  Patient alert and oriented and in no cardiopulmonary distress.  HEENT: No facial asymmetry, EOMI, no sinus tenderness,  oropharynx pink and moist.  Neck supple no adenopathy.  Chest: Clear to auscultation bilaterally.  CVS: S1, S2 no murmurs, no S3.  ABD: Soft non tender. Bowel sounds normal.  Ext: No edema  MS: Adequate ROM spine, shoulders, hips and knees.  Skin: Intact, no ulcerations or rash noted.  Psych: Good eye contact, normal affect. Memory intact not anxious or depressed appearing.  CNS: CN 2-12 intact, power, tone and sensation normal throughout.       Assessment & Plan:

## 2011-10-10 NOTE — Assessment & Plan Note (Signed)
Triglycerides elevated, labs reviewed and low fat diet discussed and encouraged

## 2011-10-10 NOTE — Patient Instructions (Addendum)
Annual exam in October, please call i you need me before  You need to start daily calcium with D supplement for bone health.  Please continue multivitamin and baby aspirin once daily  You need to cut back on red meat, cheese, egg yolk and fatty foods, triglycerides are high  Continue daily exercise this is excellent for health

## 2012-01-16 ENCOUNTER — Encounter: Payer: BC Managed Care – PPO | Admitting: Family Medicine

## 2012-02-14 ENCOUNTER — Ambulatory Visit (INDEPENDENT_AMBULATORY_CARE_PROVIDER_SITE_OTHER): Payer: BC Managed Care – PPO | Admitting: Family Medicine

## 2012-02-14 ENCOUNTER — Other Ambulatory Visit (HOSPITAL_COMMUNITY)
Admission: RE | Admit: 2012-02-14 | Discharge: 2012-02-14 | Disposition: A | Discharge status: Home / Self Care | Payer: BC Managed Care – PPO | Source: Ambulatory Visit | Attending: Family Medicine | Admitting: Family Medicine

## 2012-02-14 ENCOUNTER — Encounter: Payer: Self-pay | Admitting: Family Medicine

## 2012-02-14 VITALS — BP 122/80 | HR 82 | Resp 15 | Ht 62.0 in | Wt 132.4 lb

## 2012-02-14 DIAGNOSIS — Z124 Encounter for screening for malignant neoplasm of cervix: Secondary | ICD-10-CM

## 2012-02-14 DIAGNOSIS — M329 Systemic lupus erythematosus, unspecified: Secondary | ICD-10-CM

## 2012-02-14 DIAGNOSIS — Z01419 Encounter for gynecological examination (general) (routine) without abnormal findings: Secondary | ICD-10-CM | POA: Insufficient documentation

## 2012-02-14 DIAGNOSIS — Z1151 Encounter for screening for human papillomavirus (HPV): Secondary | ICD-10-CM | POA: Insufficient documentation

## 2012-02-14 DIAGNOSIS — Z0000 Encounter for general adult medical examination without abnormal findings: Secondary | ICD-10-CM

## 2012-02-14 DIAGNOSIS — Z1211 Encounter for screening for malignant neoplasm of colon: Secondary | ICD-10-CM

## 2012-02-14 LAB — POC HEMOCCULT BLD/STL (OFFICE/1-CARD/DIAGNOSTIC): Fecal Occult Blood, POC: NEGATIVE

## 2012-02-14 NOTE — Progress Notes (Signed)
  Subjective:    Patient ID: [name], female    DOB: [birth year], 61 y.o.   MRN: [id number]  HPI The PT is here for annual exam  and review of any available recent lab and radiology data.  Preventive health is updated, specifically  Cancer screening and Immunization.   Approximately 1 month ago, she n oted a rash on her face around the mouth and eyes, she had been using an oil on her face. Her concern is that she has been dx and treated for SLE in the past , and with the facial rash , she is concerned about a possible flare. Tw o of her 5 sisters  are diagnosed with lupus also   Exercises regularly, she also follows a healthy diet   Review of Systems See HPI Denies recent fever or chills. Denies sinus pressure, nasal congestion, ear pain or sore throat. Denies chest congestion, productive cough or wheezing. Denies chest pains, palpitations and leg swelling Denies abdominal pain, nausea, vomiting,diarrhea or constipation.   Denies dysuria, frequency, hesitancy or incontinence. Denies joint pain, swelling and limitation in mobility. Denies headaches, seizures, numbness, or tingling. Denies depression, anxiety or insomnia.       Objective:   Physical Exam Pleasant well nourished female, alert and oriented x 3, in no cardio-pulmonary distress. Afebrile. HEENT No facial trauma or asymetry. Sinuses non tender.  EOMI, PERTL, fundoscopic exam is , no hemorhage or exudate.  External ears normal, tympanic membranes clear. Oropharynx moist, no exudate, good dentition. Neck: supple, no adenopathy,JVD or thyromegaly.No bruits.  Chest: Clear to ascultation bilaterally.No crackles or wheezes. Non tender to palpation  Breast: No asymetry,no masses. No nipple discharge or inversion. No axillary or supraclavicular adenopathy  Cardiovascular system; Heart sounds normal,  S1 and  S2 ,no S3.  No murmur, or thrill. Apical beat not displaced Peripheral pulses  normal.  Abdomen: Soft, non tender, no organomegaly or masses. No bruits. Bowel sounds normal. No guarding, tenderness or rebound.  Rectal:  No mass. Guaiac negative stool.  GU: External genitalia normal. No lesions.No ulcers. Vaginal canal normal.No discharge. Uterus absent , no adnexal masses, no  adnexal tenderness.  Musculoskeletal exam: Full ROM of spine, hips , shoulders and knees. No deformity ,swelling or crepitus noted. No muscle wasting or atrophy.   Neurologic: Cranial nerves 2 to 12 intact. Power, tone ,sensation and reflexes normal throughout. No disturbance in gait. No tremor.  Skin: Intact, no ulceration, erythema , scaling or rash noted. Pigmentation normal throughout  Psych; Normal mood and affect. Judgement and concentration normal        Assessment & Plan:

## 2012-02-14 NOTE — Patient Instructions (Addendum)
F/u in 6 month, please call if you need me before.  Please get your flu vaccine at the pharmacy today.  Labs today: ANA, anti double stranded DNA (dsDNA) anti smith antibodies and you will be referred to rheumatologist for re evaluation of lupus  It is important that you exercise regularly at least 30 minutes 5 times a week. If you develop chest pain, have severe difficulty breathing, or feel very tired, stop exercising immediately and seek medical attention   All the best in your retirement!

## 2012-02-15 ENCOUNTER — Other Ambulatory Visit: Payer: Self-pay | Admitting: Family Medicine

## 2012-02-15 DIAGNOSIS — M329 Systemic lupus erythematosus, unspecified: Secondary | ICD-10-CM

## 2012-02-15 DIAGNOSIS — Z0000 Encounter for general adult medical examination without abnormal findings: Secondary | ICD-10-CM | POA: Insufficient documentation

## 2012-02-15 LAB — ANTI-DNA ANTIBODY, DOUBLE-STRANDED: ds DNA Ab: 28 IU/mL (ref <30)

## 2012-02-15 LAB — ANTI-SMITH ANTIBODY: ENA SM Ab Ser-aCnc: 1 AU/mL (ref <30)

## 2012-02-15 LAB — ANTI-NUCLEAR AB-TITER (ANA TITER): ANA Titer 1: NEGATIVE

## 2012-02-15 LAB — ANA: Anti Nuclear Antibody(ANA): POSITIVE — AB

## 2012-02-15 NOTE — Assessment & Plan Note (Signed)
Physical exam is normal Lifestyle commitment to daily exercise and healthy food choice with weight control is stressed. The importance of adequate sleep , and involvement in community espescialy with retirement is encouraged. Pt is well positioned to embrace all of the above very well, as she has done in the past. Will get flu vaccine at her pharmacy. Health maintenance otherwise up to date

## 2012-02-15 NOTE — Assessment & Plan Note (Addendum)
History of SLE treated approx 25 years ago, now with recent facial rash by history, no joint pains , fatigue or pother symptoms. Ill re test and refer to rheumatologist for re evaluation once results are available

## 2012-04-22 ENCOUNTER — Telehealth: Payer: Self-pay | Admitting: Family Medicine

## 2012-04-25 NOTE — Telephone Encounter (Signed)
Needs appt

## 2012-05-01 NOTE — Telephone Encounter (Signed)
Left message

## 2012-08-08 ENCOUNTER — Ambulatory Visit (INDEPENDENT_AMBULATORY_CARE_PROVIDER_SITE_OTHER): Payer: BC Managed Care – PPO | Admitting: Family Medicine

## 2012-08-08 ENCOUNTER — Encounter: Payer: Self-pay | Admitting: Family Medicine

## 2012-08-08 VITALS — BP 118/80 | HR 75 | Resp 16 | Wt 130.1 lb

## 2012-08-08 DIAGNOSIS — L089 Local infection of the skin and subcutaneous tissue, unspecified: Secondary | ICD-10-CM

## 2012-08-08 MED ORDER — CEPHALEXIN 500 MG PO CAPS: 500.0000 mg | ORAL_CAPSULE | Freq: Two times a day (BID) | ORAL | Status: AC

## 2012-08-08 NOTE — Progress Notes (Signed)
  Subjective:    Patient ID: [name], female    DOB: 05/12/1950, 62 y.o.   MRN: [id number]  HPI Patient presents with a bite to her right upper cheek. She was gardening 2 days ago and then noticed some redness on her face she then developed a small pustule which she had some drainage of yesterday. She uses hydrocortisone because it was itching today she put Vaseline on it. She denies any change in her vision or fever. She denies any mouth sores She was concerned she was bitten by a spider   Review of Systems  GEN- denies fatigue, fever, weight loss,weakness, recent illness HEENT- denies eye drainage, change in vision, nasal discharge, CVS- denies chest pain, palpitations RESP- denies SOB, cough, wheeze Neuro- denies headache, dizziness, syncope, seizure activity      Objective:   Physical Exam   GEN-NAD,alert and oriented x3 HEENT- PERRL, EOMI, TM clear bilat, Oropharynx clear  Lymph- no anterior or posterior neck/auricular nodes Skin- Right upper cheek dime size of erythema with small white pustules in center, mild swelling, no induration, NT, no streaking      Assessment & Plan:

## 2012-08-08 NOTE — Patient Instructions (Signed)
Start antibiotics for your face Warm compresses Call or go to ER if you face swells any further or you develop fever, chills  F/U as previous Dr. Lodema Hong

## 2012-08-08 NOTE — Assessment & Plan Note (Signed)
Small pustules has already drained some, warm compresses, start keflex  Given red flags No systemic symptoms, no compromise of vision

## 2012-11-12 ENCOUNTER — Other Ambulatory Visit: Payer: Self-pay | Admitting: Family Medicine

## 2012-11-12 DIAGNOSIS — Z139 Encounter for screening, unspecified: Secondary | ICD-10-CM

## 2012-11-14 ENCOUNTER — Ambulatory Visit (HOSPITAL_COMMUNITY)
Admission: RE | Admit: 2012-11-14 | Discharge: 2012-11-14 | Disposition: A | Discharge status: Home / Self Care | Payer: BC Managed Care – PPO | Source: Ambulatory Visit | Attending: Family Medicine | Admitting: Family Medicine

## 2012-11-14 DIAGNOSIS — Z1231 Encounter for screening mammogram for malignant neoplasm of breast: Secondary | ICD-10-CM | POA: Insufficient documentation

## 2012-11-14 DIAGNOSIS — Z139 Encounter for screening, unspecified: Secondary | ICD-10-CM

## 2012-12-05 ENCOUNTER — Telehealth: Payer: Self-pay

## 2012-12-05 DIAGNOSIS — R5381 Other malaise: Secondary | ICD-10-CM

## 2012-12-05 DIAGNOSIS — Z139 Encounter for screening, unspecified: Secondary | ICD-10-CM

## 2012-12-05 DIAGNOSIS — R5383 Other fatigue: Secondary | ICD-10-CM

## 2012-12-05 DIAGNOSIS — E785 Hyperlipidemia, unspecified: Secondary | ICD-10-CM

## 2012-12-05 NOTE — Telephone Encounter (Signed)
Need to order labs before patient comes in for appointment

## 2012-12-12 ENCOUNTER — Other Ambulatory Visit: Payer: Self-pay | Admitting: Family Medicine

## 2012-12-13 LAB — CBC WITH DIFFERENTIAL/PLATELET
Basophils Absolute: 0 10*3/uL (ref 0.0–0.1)
Basophils Relative: 1 % (ref 0–1)
Eosinophils Absolute: 0.1 10*3/uL (ref 0.0–0.7)
Eosinophils Relative: 2 % (ref 0–5)
HCT: 36.4 % (ref 36.0–46.0)
Hemoglobin: 11.8 g/dL — ABNORMAL LOW (ref 12.0–15.0)
Lymphocytes Relative: 43 % (ref 12–46)
Lymphs Abs: 2 10*3/uL (ref 0.7–4.0)
MCH: 27.1 pg (ref 26.0–34.0)
MCHC: 32.4 g/dL (ref 30.0–36.0)
MCV: 83.7 fL (ref 78.0–100.0)
Monocytes Absolute: 0.4 10*3/uL (ref 0.1–1.0)
Monocytes Relative: 10 % (ref 3–12)
Neutro Abs: 2 10*3/uL (ref 1.7–7.7)
Neutrophils Relative %: 44 % (ref 43–77)
Platelets: 157 10*3/uL (ref 150–400)
RBC: 4.35 MIL/uL (ref 3.87–5.11)
RDW: 15.7 % — ABNORMAL HIGH (ref 11.5–15.5)
WBC: 4.6 10*3/uL (ref 4.0–10.5)

## 2012-12-13 LAB — LIPID PANEL
Cholesterol: 156 mg/dL (ref 0–200)
HDL: 31 mg/dL — ABNORMAL LOW (ref >39)
LDL Cholesterol: 65 mg/dL (ref 0–99)
Total CHOL/HDL Ratio: 5 Ratio
Triglycerides: 298 mg/dL — ABNORMAL HIGH (ref <150)
VLDL: 60 mg/dL — ABNORMAL HIGH (ref 0–40)

## 2012-12-13 LAB — VITAMIN D 25 HYDROXY (VIT D DEFICIENCY, FRACTURES): Vit D, 25-Hydroxy: 49 ng/mL (ref 30–89)

## 2012-12-13 LAB — BASIC METABOLIC PANEL
BUN: 15 mg/dL (ref 6–23)
CO2: 28 mEq/L (ref 19–32)
Calcium: 9.4 mg/dL (ref 8.4–10.5)
Chloride: 106 mEq/L (ref 96–112)
Creat: 1.12 mg/dL — ABNORMAL HIGH (ref 0.50–1.10)
Glucose, Bld: 95 mg/dL (ref 70–99)
Potassium: 4.8 mEq/L (ref 3.5–5.3)
Sodium: 140 mEq/L (ref 135–145)

## 2012-12-13 LAB — TSH: TSH: 1.164 u[IU]/mL (ref 0.350–4.500)

## 2012-12-16 ENCOUNTER — Ambulatory Visit (INDEPENDENT_AMBULATORY_CARE_PROVIDER_SITE_OTHER): Payer: BC Managed Care – PPO | Admitting: Family Medicine

## 2012-12-16 ENCOUNTER — Encounter: Payer: Self-pay | Admitting: Family Medicine

## 2012-12-16 VITALS — BP 118/78 | HR 95 | Resp 16 | Ht 62.0 in | Wt 130.4 lb

## 2012-12-16 DIAGNOSIS — Z23 Encounter for immunization: Secondary | ICD-10-CM

## 2012-12-16 DIAGNOSIS — D649 Anemia, unspecified: Secondary | ICD-10-CM

## 2012-12-16 DIAGNOSIS — Z2911 Encounter for prophylactic immunotherapy for respiratory syncytial virus (RSV): Secondary | ICD-10-CM

## 2012-12-16 DIAGNOSIS — E785 Hyperlipidemia, unspecified: Secondary | ICD-10-CM

## 2012-12-16 DIAGNOSIS — Z1211 Encounter for screening for malignant neoplasm of colon: Secondary | ICD-10-CM

## 2012-12-16 NOTE — Progress Notes (Signed)
  Subjective:    Patient ID: [name], female    DOB: 1951/01/18, 62 y.o.   MRN: [id number]  HPI The PT is here for follow up and re-evaluation of chronic medical conditions, medication management and review of any available recent lab and radiology data.  Preventive health is updated, specifically  Cancer screening and Immunization.   Questions or concerns regarding consultations or procedures which the PT has had in the interim are  Addressed.Mammogram earlier this year is normal C/o recent change in stool character, like "little balls" intermittently for past 6 month, colonoscopy due next year , she is to call back for earlier eval due to high deductible    Review of Systems See HPI Denies recent fever or chills. Denies sinus pressure, nasal congestion, ear pain or sore throat. Denies chest congestion, productive cough or wheezing. Denies chest pains, palpitations and leg swelling Denies abdominal pain, nausea, vomiting,diarrhea or constipation.   Denies dysuria, frequency, hesitancy or incontinence. Denies joint pain, swelling and limitation in mobility. Denies headaches, seizures, numbness, or tingling. Denies depression, anxiety or insomnia. Denies skin break down or rash.        Objective:   Physical Exam Patient alert and oriented and in no cardiopulmonary distress.  HEENT: No facial asymmetry, EOMI, no sinus tenderness,  oropharynx pink and moist.  Neck supple no adenopathy.  Chest: Clear to auscultation bilaterally.  CVS: S1, S2 no murmurs, no S3.  ABD: Soft non tender. Bowel sounds normal.No organomegaly or mass Rectal: no mass, heme negative stool  Ext: No edema  MS: Adequate ROM spine, shoulders, hips and knees.  Skin: Intact, no ulcerations or rash noted.  Psych: Good eye contact, normal affect. Memory intact not anxious or depressed appearing.  CNS: CN 2-12 intact, power, tone and sensation normal throughout.        Assessment & Plan:

## 2012-12-16 NOTE — Patient Instructions (Addendum)
F/u in 6 month, call if you need me before  Flu  And zostavx today  You need to start calcium with D supplement 1 daily for bone health  1200mg /1000IU gel capsule    You need a  colonoscopy this year based on h/o change in stool with "balls" for the past month, also because you are slightly anemic, pls let us know this week what you decide   Bone density scan is recommended  Reduce fatty food intake and pls commit to physical activity for 30 to 45 minutes total every day, lipids are high  Fasting lipid in 6 month  Rectal today

## 2012-12-17 LAB — POC HEMOCCULT BLD/STL (OFFICE/1-CARD/DIAGNOSTIC): Fecal Occult Blood, POC: NEGATIVE

## 2012-12-17 LAB — IRON AND TIBC
%SAT: 32 % (ref 20–55)
Iron: 84 ug/dL (ref 42–145)
TIBC: 262 ug/dL (ref 250–470)
UIBC: 178 ug/dL (ref 125–400)

## 2012-12-20 ENCOUNTER — Encounter: Payer: Self-pay | Admitting: Family Medicine

## 2012-12-22 DIAGNOSIS — D649 Anemia, unspecified: Secondary | ICD-10-CM | POA: Insufficient documentation

## 2012-12-22 NOTE — Assessment & Plan Note (Signed)
Deteriorated, TG very elevated Pt educated re the need to lower fatty food intake to improve this

## 2012-12-22 NOTE — Assessment & Plan Note (Signed)
Mild anemia with change in BM, GI eval recommended, pt to call back after determining insurance coverage states she has a high deductible

## 2013-06-16 ENCOUNTER — Ambulatory Visit: Payer: BC Managed Care – PPO | Admitting: Family Medicine

## 2013-07-16 ENCOUNTER — Ambulatory Visit (INDEPENDENT_AMBULATORY_CARE_PROVIDER_SITE_OTHER): Payer: BC Managed Care – PPO | Admitting: Family Medicine

## 2013-07-16 ENCOUNTER — Encounter: Payer: Self-pay | Admitting: Family Medicine

## 2013-07-16 VITALS — BP 112/80 | HR 79 | Resp 16 | Ht 62.0 in | Wt 131.4 lb

## 2013-07-16 DIAGNOSIS — D649 Anemia, unspecified: Secondary | ICD-10-CM

## 2013-07-16 DIAGNOSIS — R5381 Other malaise: Secondary | ICD-10-CM

## 2013-07-16 DIAGNOSIS — M79672 Pain in left foot: Principal | ICD-10-CM

## 2013-07-16 DIAGNOSIS — IMO0002 Reserved for concepts with insufficient information to code with codable children: Secondary | ICD-10-CM

## 2013-07-16 DIAGNOSIS — M79671 Pain in right foot: Secondary | ICD-10-CM

## 2013-07-16 DIAGNOSIS — M899 Disorder of bone, unspecified: Secondary | ICD-10-CM

## 2013-07-16 DIAGNOSIS — R5383 Other fatigue: Secondary | ICD-10-CM

## 2013-07-16 DIAGNOSIS — Z13820 Encounter for screening for osteoporosis: Secondary | ICD-10-CM

## 2013-07-16 DIAGNOSIS — E785 Hyperlipidemia, unspecified: Secondary | ICD-10-CM

## 2013-07-16 DIAGNOSIS — M949 Disorder of cartilage, unspecified: Secondary | ICD-10-CM

## 2013-07-16 DIAGNOSIS — M79609 Pain in unspecified limb: Secondary | ICD-10-CM

## 2013-07-16 MED ORDER — PREDNISONE (PAK) 5 MG PO TABS: ORAL_TABLET | ORAL | Status: DC

## 2013-07-16 MED ORDER — IBUPROFEN 600 MG PO TABS: ORAL_TABLET | ORAL | Status: DC

## 2013-07-16 NOTE — Patient Instructions (Addendum)
cPE 09/18 or after, call if you need me before  PLEASE CUT bACK on egg yolk, butter and cheese as discussed  KEEP DANCING!  You are referred for bone density test  Fasting CBC, lipid, chem 7, TSH and Vit D 09/10/or after , but brefore f/u please  Prednisone and ibuprofen are faxed to your pharmacy, if abnormal sensatio in feet that you have had for the past month persist call, you will need further investigation

## 2013-07-21 ENCOUNTER — Ambulatory Visit (HOSPITAL_COMMUNITY)
Admission: RE | Admit: 2013-07-21 | Discharge: 2013-07-21 | Disposition: A | Discharge status: Home / Self Care | Payer: BC Managed Care – PPO | Source: Ambulatory Visit | Attending: Family Medicine | Admitting: Family Medicine

## 2013-07-21 ENCOUNTER — Encounter: Payer: Self-pay | Admitting: Family Medicine

## 2013-07-21 DIAGNOSIS — M8580 Other specified disorders of bone density and structure, unspecified site: Secondary | ICD-10-CM | POA: Insufficient documentation

## 2013-07-21 DIAGNOSIS — Z13820 Encounter for screening for osteoporosis: Secondary | ICD-10-CM

## 2013-08-18 DIAGNOSIS — M79672 Pain in left foot: Principal | ICD-10-CM

## 2013-08-18 DIAGNOSIS — M79671 Pain in right foot: Secondary | ICD-10-CM | POA: Insufficient documentation

## 2013-08-18 NOTE — Assessment & Plan Note (Signed)
Established disease in spine , new bilateral foot pain on soles, neuropathic, i believe symptoms are due to back disease will treat as such and she is to call back for persistent sympotoms

## 2013-08-18 NOTE — Assessment & Plan Note (Signed)
Most likely cause is from spondylosis and disc disease, short course of anti inflammatories prescribed, pt to call back if symptoms persist

## 2013-08-18 NOTE — Assessment & Plan Note (Signed)
Hyperlipidemia:Low fat diet discussed and encouraged.  Updated lab needed at/ before next visit.  

## 2013-08-18 NOTE — Progress Notes (Signed)
   Subjective:    Patient ID: [name], female    DOB: [birth year], 63 y.o.   MRN: [id number]  HPI The PT is here for follow up and re-evaluation of chronic medical conditions, and review of any available recent lab and radiology data.  Preventive health is updated, specifically  Cancer screening and Immunization.   3 to 4 week h/o increased bilateral foot pain affecting her soles. Experienced as a tingling and numbness, she has recently started line dancing and attributes her symptoms to this Pain limits mobility at times, and seldom keeps her  u pat night       Review of Systems    See HPI Denies recent fever or chills. Denies sinus pressure, nasal congestion, ear pain or sore throat. Denies chest congestion, productive cough or wheezing. Denies chest pains, palpitations and leg swelling Denies abdominal pain, nausea, vomiting,diarrhea or constipation.   Denies dysuria, frequency, hesitancy or incontinence. Has known back disease, but denies current back pain swelling and limitation in mobil of spine. Foot pain limits dancing at times. Denies headaches, seizures, Denies depression, anxiety or insomnia. Denies skin break down or rash.      Objective:   Physical Exam  BP 112/80  Pulse 79  Resp 16  Ht 5\' 2"  (1.575 m)  Wt 131 lb 6.4 oz (59.603 kg)  BMI 24.03 kg/m2  SpO2 99% Patient alert and oriented and in no cardiopulmonary distress.  HEENT: No facial asymmetry, EOMI, no sinus tenderness,  oropharynx pink and moist.  Neck supple no adenopathy.  Chest: Clear to auscultation bilaterally.  CVS: S1, S2 no murmurs, no S3.  ABD: Soft non tender. Bowel sounds normal.  Ext: No edema  MS: Adequate ROM spine, shoulders, hips and knees.  Skin: Intact, no ulcerations or rash noted.  Psych: Good eye contact, normal affect. Memory intact not anxious or depressed appearing.  CNS: CN 2-12 intact, power, normal throughout. Slight numbness noted in certain areas of the  sole but overall sensation judged to be within normal      Assessment & Plan:  Bilateral foot pain Most likely cause is from spondylosis and disc disease, short course of anti inflammatories prescribed, pt to call back if symptoms persist  DYSLIPIDEMIA Hyperlipidemia:Low fat diet discussed and encouraged.  Updated lab needed at/ before next visit.   BACK PAIN, LUMBAR, WITH RADICULOPATHY Established disease in spine , new bilateral foot pain on soles, neuropathic, i believe symptoms are due to back disease will treat as such and she is to call back for persistent sympotoms   bap

## 2013-11-12 ENCOUNTER — Telehealth: Payer: Self-pay | Admitting: Family Medicine

## 2013-11-12 ENCOUNTER — Ambulatory Visit (INDEPENDENT_AMBULATORY_CARE_PROVIDER_SITE_OTHER): Payer: BC Managed Care – PPO

## 2013-11-12 VITALS — BP 118/80 | Wt 131.1 lb

## 2013-11-12 DIAGNOSIS — N3000 Acute cystitis without hematuria: Secondary | ICD-10-CM

## 2013-11-12 LAB — POCT URINALYSIS DIPSTICK
Bilirubin, UA: NEGATIVE
Blood, UA: NEGATIVE
Glucose, UA: NEGATIVE
Ketones, UA: NEGATIVE
Nitrite, UA: NEGATIVE
Protein, UA: NEGATIVE
Spec Grav, UA: 1.02
Urobilinogen, UA: 0.2
pH, UA: 6.5

## 2013-11-12 MED ORDER — CIPROFLOXACIN HCL 500 MG PO TABS: 500.0000 mg | ORAL_TABLET | Freq: Two times a day (BID) | ORAL | Status: DC

## 2013-11-12 NOTE — Progress Notes (Signed)
3 days of cipro called in per Dr. Abbott Pao aware and ua sent for culture

## 2013-11-12 NOTE — Progress Notes (Signed)
   Subjective:    Patient ID: [name], female    DOB: 01-06-1951, 63 y.o.   MRN: [id number]  HPI    Review of Systems     Objective:   Physical Exam        Assessment & Plan:

## 2013-11-14 LAB — URINE CULTURE
Colony Count: NO GROWTH
Organism ID, Bacteria: NO GROWTH

## 2013-11-17 ENCOUNTER — Other Ambulatory Visit: Payer: Self-pay | Admitting: Family Medicine

## 2013-11-17 DIAGNOSIS — Z1231 Encounter for screening mammogram for malignant neoplasm of breast: Secondary | ICD-10-CM

## 2013-11-17 NOTE — Telephone Encounter (Signed)
noted 

## 2013-11-20 ENCOUNTER — Other Ambulatory Visit: Payer: Self-pay | Admitting: Family Medicine

## 2013-11-20 DIAGNOSIS — Z1211 Encounter for screening for malignant neoplasm of colon: Secondary | ICD-10-CM

## 2013-11-24 ENCOUNTER — Ambulatory Visit (HOSPITAL_COMMUNITY)
Admission: RE | Admit: 2013-11-24 | Discharge: 2013-11-24 | Disposition: A | Discharge status: Home / Self Care | Payer: BC Managed Care – PPO | Source: Ambulatory Visit | Attending: Family Medicine | Admitting: Family Medicine

## 2013-11-24 DIAGNOSIS — Z1231 Encounter for screening mammogram for malignant neoplasm of breast: Secondary | ICD-10-CM | POA: Diagnosis present

## 2013-12-02 ENCOUNTER — Telehealth: Payer: Self-pay

## 2013-12-02 NOTE — Telephone Encounter (Signed)
Pt called to be triaged. Please call her back at [phone]. She said that she called her insurance company and had gotten some information

## 2013-12-02 NOTE — Telephone Encounter (Signed)
LMOM for pt to return call this afternoon after 3:30 pm to be triaged for colonoscopy.

## 2013-12-09 NOTE — Telephone Encounter (Signed)
LMOM for pt to return call after 3:00 pm today.

## 2013-12-11 ENCOUNTER — Telehealth: Payer: Self-pay

## 2013-12-11 NOTE — Telephone Encounter (Signed)
RETURNING CALL TO SCHEDULE COLONOSCOPY [phone]

## 2013-12-12 ENCOUNTER — Other Ambulatory Visit: Payer: Self-pay

## 2013-12-12 ENCOUNTER — Telehealth: Payer: Self-pay

## 2013-12-12 DIAGNOSIS — Z1211 Encounter for screening for malignant neoplasm of colon: Secondary | ICD-10-CM

## 2013-12-12 NOTE — Telephone Encounter (Signed)
Appropriate.

## 2013-12-12 NOTE — Telephone Encounter (Signed)
Gastroenterology Pre-Procedure Review  Request Date: 12/12/2013 Requesting Physician: Moshe Cipro  PATIENT REVIEW QUESTIONS: The patient responded to the following health history questions as indicated:    1. Diabetes Melitis: no 2. Joint replacements in the past 12 months: no 3. Major health problems in the past 3 months: no 4. Has an artificial valve or MVP: no 5. Has a defibrillator: no 6. Has been advised in past to take antibiotics in advance of a procedure like teeth cleaning: no    MEDICATIONS & ALLERGIES:    Patient reports the following regarding taking any blood thinners:   Plavix? no Aspirin? YES Coumadin? no  Patient confirms/reports the following medications:  Current Outpatient Prescriptions  Medication Sig Dispense Refill  . aspirin 81 MG tablet Take 81 mg by mouth daily.      . calcium carbonate (TUMS - DOSED IN MG ELEMENTAL CALCIUM) 500 MG chewable tablet Two gummy calcium supplements daily      . ibuprofen (ADVIL,MOTRIN) 600 MG tablet One twice daily for 5 days, then as needed  30 tablet  0  . Multiple Vitamin (MULTIVITAMIN) capsule Take 1 capsule by mouth daily.      . ciprofloxacin (CIPRO) 500 MG tablet Take 500 mg by mouth 2 (two) times daily.      . ciprofloxacin (CIPRO) 500 MG tablet Take 1 tablet (500 mg total) by mouth 2 (two) times daily.  6 tablet  0  . predniSONE (STERAPRED UNI-PAK) 5 MG TABS tablet Use as directed  21 tablet  0   No current facility-administered medications for this visit.    Patient confirms/reports the following allergies:  Allergies  Allergen Reactions  . Sulfonamide Derivatives     No orders of the defined types were placed in this encounter.    AUTHORIZATION INFORMATION Primary Insurance:   ID #:   Group #:  Pre-Cert / Auth required:  Pre-Cert / Auth #:   Secondary Insurance:   ID #:   Group #:  Pre-Cert / Auth required:  Pre-Cert / Auth #:   SCHEDULE INFORMATION: Procedure has been scheduled as follows:  Date:  01/05/2014                   Time:12:15 PM  Location: [facility] Short Stay  This Gastroenterology Pre-Precedure Review Form is being routed to the following provider(s): R. Garfield Cornea, MD

## 2013-12-12 NOTE — Telephone Encounter (Signed)
See triage dated 12/12/2013.

## 2013-12-15 MED ORDER — PEG-KCL-NACL-NASULF-NA ASC-C 100 G PO SOLR: 1.0000 | ORAL | Status: DC

## 2013-12-15 NOTE — Telephone Encounter (Signed)
Rx sent to the pharmacy and instructions mailed to pt.  

## 2013-12-15 NOTE — Telephone Encounter (Signed)
LM for pt to call

## 2013-12-19 ENCOUNTER — Telehealth: Payer: Self-pay | Admitting: Internal Medicine

## 2013-12-19 NOTE — Telephone Encounter (Signed)
Pt called to let us know that she takes a low dose aspirin every morning. She is scheduled for tcs with RMR on 10/12 and the letter said if you are on blood thinners to let office know. Please call her back at [phone]

## 2013-12-19 NOTE — Telephone Encounter (Signed)
Agree 

## 2013-12-19 NOTE — Telephone Encounter (Signed)
I called and LMOM for pt that we do not take off ASA for screening colonoscopy. Please call with questions.

## 2013-12-23 ENCOUNTER — Encounter (HOSPITAL_COMMUNITY): Payer: Self-pay | Admitting: Pharmacy Technician

## 2014-01-05 ENCOUNTER — Encounter (HOSPITAL_COMMUNITY)
Admission: RE | Disposition: A | Discharge status: Home / Self Care | Payer: Self-pay | Source: Ambulatory Visit | Attending: Internal Medicine

## 2014-01-05 ENCOUNTER — Ambulatory Visit (HOSPITAL_COMMUNITY)
Admission: RE | Admit: 2014-01-05 | Discharge: 2014-01-05 | Disposition: A | Discharge status: Home / Self Care | Payer: BC Managed Care – PPO | Source: Ambulatory Visit | Attending: Internal Medicine | Admitting: Internal Medicine

## 2014-01-05 ENCOUNTER — Encounter (HOSPITAL_COMMUNITY): Payer: Self-pay | Admitting: *Deleted

## 2014-01-05 DIAGNOSIS — Z7982 Long term (current) use of aspirin: Secondary | ICD-10-CM | POA: Diagnosis not present

## 2014-01-05 DIAGNOSIS — Z1211 Encounter for screening for malignant neoplasm of colon: Secondary | ICD-10-CM | POA: Insufficient documentation

## 2014-01-05 DIAGNOSIS — D122 Benign neoplasm of ascending colon: Secondary | ICD-10-CM | POA: Diagnosis not present

## 2014-01-05 DIAGNOSIS — M329 Systemic lupus erythematosus, unspecified: Secondary | ICD-10-CM | POA: Insufficient documentation

## 2014-01-05 DIAGNOSIS — Z882 Allergy status to sulfonamides status: Secondary | ICD-10-CM | POA: Diagnosis not present

## 2014-01-05 DIAGNOSIS — K5730 Diverticulosis of large intestine without perforation or abscess without bleeding: Secondary | ICD-10-CM | POA: Diagnosis not present

## 2014-01-05 DIAGNOSIS — Z86010 Personal history of colonic polyps: Secondary | ICD-10-CM

## 2014-01-05 DIAGNOSIS — M1990 Unspecified osteoarthritis, unspecified site: Secondary | ICD-10-CM | POA: Diagnosis not present

## 2014-01-05 HISTORY — PX: COLONOSCOPY: SHX5424

## 2014-01-05 SURGERY — COLONOSCOPY
Anesthesia: Moderate Sedation

## 2014-01-05 MED ORDER — STERILE WATER FOR IRRIGATION IR SOLN
Status: DC | PRN
  Administered 2014-01-05: 12:00:00

## 2014-01-05 MED ORDER — MIDAZOLAM HCL 5 MG/5ML IJ SOLN
INTRAMUSCULAR | Status: DC | PRN
  Administered 2014-01-05: 2 mg via INTRAVENOUS
  Administered 2014-01-05: 1 mg via INTRAVENOUS

## 2014-01-05 MED ORDER — ONDANSETRON HCL 4 MG/2ML IJ SOLN
INTRAMUSCULAR | Status: DC | PRN
  Administered 2014-01-05: 4 mg via INTRAVENOUS

## 2014-01-05 MED ORDER — MEPERIDINE HCL 100 MG/ML IJ SOLN
INTRAMUSCULAR | Status: DC | PRN
  Administered 2014-01-05: 50 mg via INTRAVENOUS

## 2014-01-05 MED ORDER — SODIUM CHLORIDE 0.9 % IV SOLN
INTRAVENOUS | Status: DC
  Administered 2014-01-05: 12:00:00 via INTRAVENOUS

## 2014-01-05 MED ORDER — ONDANSETRON HCL 4 MG/2ML IJ SOLN
INTRAMUSCULAR | Status: AC
  Filled 2014-01-05: qty 2

## 2014-01-05 MED ORDER — MIDAZOLAM HCL 5 MG/5ML IJ SOLN
INTRAMUSCULAR | Status: AC
  Filled 2014-01-05: qty 10

## 2014-01-05 MED ORDER — MEPERIDINE HCL 100 MG/ML IJ SOLN
INTRAMUSCULAR | Status: AC
  Filled 2014-01-05: qty 2

## 2014-01-05 NOTE — Op Note (Signed)
[facility] [address] [place], [address]   COLONOSCOPY PROCEDURE REPORT  PATIENT: [name], [name]  MR#: [id number] BIRTHDATE: April 28, 1950 , 63  yrs. old GENDER: female ENDOSCOPIST: R.  Garfield Cornea, MD FACP [facility] REFERRED VZ:DGLOVFIE Moshe Cipro, M.D. PROCEDURE DATE:  01-29-2014 PROCEDURE:   Colonoscopy with biopsy INDICATIONS:average risk for colorectal cancer. MEDICATIONS: Versed 3 mg IV and Demerol 50 mg IV in divided doses. Zofran 4 mg IV. ASA CLASS:       Class II  CONSENT: The risks, benefits, alternatives and imponderables including but not limited to bleeding, perforation as well as the possibility of a missed lesion have been reviewed.  The potential for biopsy, lesion removal, etc. have also been discussed. Questions have been answered.  All parties agreeable.  Please see the history and physical in the medical record for more information.  DESCRIPTION OF PROCEDURE:   After the risks benefits and alternatives of the procedure were thoroughly explained, informed consent was obtained.  The digital rectal exam revealed no abnormalities of the rectum.   The EC-[id number] ([id number])  endoscope was introduced through the anus and advanced to the cecum, which was identified by both the appendix and ileocecal valve. No adverse events experienced.   The quality of the prep was adequate.  The instrument was then slowly withdrawn as the colon was fully examined.      COLON FINDINGS: Rectal mucosa appeared normal.  Small rectal vault. Unable to retroflex.  Rectal mucosa seen well.  Patient has scattered pancolonic diverticula; (1) 3 mm polyp in the ascending segment; otherwise, the remainder of colonic mucosa appeared normal.  Retroflexion was not performed due to a narrow rectal vault. .  Withdrawal time=8 minutes 0 seconds.  The scope was withdrawn and the procedure completed. COMPLICATIONS: There were no immediate complications.  ENDOSCOPIC  IMPRESSION: 1.   Pancolonic diverticulosis.  Colonic polyps-removed as described above.  RECOMMENDATIONS: Followup on pathology.  eSigned:  R. Garfield Cornea, MD Rosalita Chessman [facility] 2014-01-29 12:57 PM   cc:  CPT CODES: ICD CODES:  The ICD and CPT codes recommended by this software are interpretations from the data that the clinical staff has captured with the software.  The verification of the translation of this report to the ICD and CPT codes and modifiers is the sole responsibility of the health care institution and practicing physician where this report was generated.  [employer] [place]. will not be held responsible for the validity of the ICD and CPT codes included on this report.  AMA assumes no liability for data contained or not contained herein. CPT is a Designer, television/film set of the [employer].  PATIENT NAME:  [name], [name] MR#: [id number]

## 2014-01-05 NOTE — Discharge Instructions (Addendum)
°Colonoscopy °Discharge Instructions ° °Read the instructions outlined below and refer to this sheet in the next few weeks. These discharge instructions provide you with general information on caring for yourself after you leave the hospital. Your doctor may also give you specific instructions. While your treatment has been planned according to the most current medical practices available, unavoidable complications occasionally occur. If you have any problems or questions after discharge, call Dr. Rourk at [phone]. °ACTIVITY °· You may resume your regular activity, but move at a slower pace for the next 24 hours.  °· Take frequent rest periods for the next 24 hours.  °· Walking will help get rid of the air and reduce the bloated feeling in your belly (abdomen).  °· No driving for 24 hours (because of the medicine (anesthesia) used during the test).   °· Do not sign any important legal documents or operate any machinery for 24 hours (because of the anesthesia used during the test).  °NUTRITION °· Drink plenty of fluids.  °· You may resume your normal diet as instructed by your doctor.  °· Begin with a light meal and progress to your normal diet. Heavy or fried foods are harder to digest and may make you feel sick to your stomach (nauseated).  °· Avoid alcoholic beverages for 24 hours or as instructed.  °MEDICATIONS °· You may resume your normal medications unless your doctor tells you otherwise.  °WHAT YOU CAN EXPECT TODAY °· Some feelings of bloating in the abdomen.  °· Passage of more gas than usual.  °· Spotting of blood in your stool or on the toilet paper.  °IF YOU HAD POLYPS REMOVED DURING THE COLONOSCOPY: °· No aspirin products for 7 days or as instructed.  °· No alcohol for 7 days or as instructed.  °· Eat a soft diet for the next 24 hours.  °FINDING OUT THE RESULTS OF YOUR TEST °Not all test results are available during your visit. If your test results are not back during the visit, make an appointment  with your caregiver to find out the results. Do not assume everything is normal if you have not heard from your caregiver or the medical facility. It is important for you to follow up on all of your test results.  °SEEK IMMEDIATE MEDICAL ATTENTION IF: °· You have more than a spotting of blood in your stool.  °· Your belly is swollen (abdominal distention).  °· You are nauseated or vomiting.  °· You have a temperature over 101.  °· You have abdominal pain or discomfort that is severe or gets worse throughout the day.  ° °Diverticulosis and polyp information provided ° °Further recommendations to follow pending review of pathology report ° °Colon Polyps °Polyps are lumps of extra tissue growing inside the body. Polyps can grow in the large intestine (colon). Most colon polyps are noncancerous (benign). However, some colon polyps can become cancerous over time. Polyps that are larger than a pea may be harmful. To be safe, caregivers remove and test all polyps. °CAUSES  °Polyps form when mutations in the genes cause your cells to grow and divide even though no more tissue is needed. °RISK FACTORS °There are a number of risk factors that can increase your chances of getting colon polyps. They include: °· Being older than 50 years. °· Family history of colon polyps or colon cancer. °· Long-term colon diseases, such as colitis or Crohn disease. °· Being overweight. °· Smoking. °· Being inactive. °· Drinking too much alcohol. °  SYMPTOMS  °Most small polyps do not cause symptoms. If symptoms are present, they may include: °· Blood in the stool. The stool may look dark red or black. °· Constipation or diarrhea that lasts longer than 1 week. °DIAGNOSIS °People often do not know they have polyps until their caregiver finds them during a regular checkup. Your caregiver can use 4 tests to check for polyps: °· Digital rectal exam. The caregiver wears gloves and feels inside the rectum. This test would find polyps only in the  rectum. °· Barium enema. The caregiver puts a liquid called barium into your rectum before taking X-rays of your colon. Barium makes your colon look white. Polyps are dark, so they are easy to see in the X-ray pictures. °· Sigmoidoscopy. A thin, flexible tube (sigmoidoscope) is placed into your rectum. The sigmoidoscope has a light and tiny camera in it. The caregiver uses the sigmoidoscope to look at the last third of your colon. °· Colonoscopy. This test is like sigmoidoscopy, but the caregiver looks at the entire colon. This is the most common method for finding and removing polyps. °TREATMENT  °Any polyps will be removed during a sigmoidoscopy or colonoscopy. The polyps are then tested for cancer. °PREVENTION  °To help lower your risk of getting more colon polyps: °· Eat plenty of fruits and vegetables. Avoid eating fatty foods. °· Do not smoke. °· Avoid drinking alcohol. °· Exercise every day. °· Lose weight if recommended by your caregiver. °· Eat plenty of calcium and folate. Foods that are rich in calcium include milk, cheese, and broccoli. Foods that are rich in folate include chickpeas, kidney beans, and spinach. °HOME CARE INSTRUCTIONS °Keep all follow-up appointments as directed by your caregiver. You may need periodic exams to check for polyps. °SEEK MEDICAL CARE IF: °You notice bleeding during a bowel movement. °Document Released: 12/08/2003 Document Revised: 06/05/2011 Document Reviewed: 05/23/2011 °ExitCare® Patient Information ©2015 [employer]. This information is not intended to replace advice given to you by your health care provider. Make sure you discuss any questions you have with your health care provider. °Diverticulosis °Diverticulosis is the condition that develops when small pouches (diverticula) form in the wall of your colon. Your colon, or large intestine, is where water is absorbed and stool is formed. The pouches form when the inside layer of your colon pushes through weak spots in  the outer layers of your colon. °CAUSES  °No one knows exactly what causes diverticulosis. °RISK FACTORS °· Being older than 50. Your risk for this condition increases with age. Diverticulosis is rare in people younger than 40 years. By age 80, almost everyone has it. °· Eating a low-fiber diet. °· Being frequently constipated. °· Being overweight. °· Not getting enough exercise. °· Smoking. °· Taking over-the-counter pain medicines, like aspirin and ibuprofen. °SYMPTOMS  °Most people with diverticulosis do not have symptoms. °DIAGNOSIS  °Because diverticulosis often has no symptoms, health care providers often discover the condition during an exam for other colon problems. In many cases, a health care provider will diagnose diverticulosis while using a flexible scope to examine the colon (colonoscopy). °TREATMENT  °If you have never developed an infection related to diverticulosis, you may not need treatment. If you have had an infection before, treatment may include: °· Eating more fruits, vegetables, and grains. °· Taking a fiber supplement. °· Taking a live bacteria supplement (probiotic). °· Taking medicine to relax your colon. °HOME CARE INSTRUCTIONS  °· Drink at least 6-8 glasses of water each day to   prevent constipation. °· Try not to strain when you have a bowel movement. °· Keep all follow-up appointments. °If you have had an infection before:  °· Increase the fiber in your diet as directed by your health care provider or dietitian. °· Take a dietary fiber supplement if your health care provider approves. °· Only take medicines as directed by your health care provider. °SEEK MEDICAL CARE IF:  °· You have abdominal pain. °· You have bloating. °· You have cramps. °· You have not gone to the bathroom in 3 days. °SEEK IMMEDIATE MEDICAL CARE IF:  °· Your pain gets worse. °· Your bloating becomes very bad. °· You have a fever or chills, and your symptoms suddenly get worse. °· You begin vomiting. °· You have  bowel movements that are bloody or black. °MAKE SURE YOU: °· Understand these instructions. °· Will watch your condition. °· Will get help right away if you are not doing well or get worse. °Document Released: 12/09/2003 Document Revised: 03/18/2013 Document Reviewed: 02/05/2013 °ExitCare® Patient Information ©2015 [employer]. This information is not intended to replace advice given to you by your health care provider. Make sure you discuss any questions you have with your health care provider. ° °

## 2014-01-05 NOTE — H&P (Signed)
@LOGO @   Primary Care Physician:  Tula Nakayama, MD Primary Gastroenterologist:  Dr. Gala Romney  Pre-Procedure History & Physical: HPI:  [name] is a 63 y.o. female is here for a screening colonoscopy. No bowel symptoms. No family history colon cancer. Reported negative colonoscopy 10 years ago (records not readily available at this time).  Past Medical History  Diagnosis Date  . SLE (systemic lupus erythematosus) dx apprx 1990    reports immunotherapy, cytoxan and steroids at [facility], no f/u for over 18yrs  . Arthritis 02/13/10    abnormal MRI    Past Surgical History  Procedure Laterality Date  . Bladder repair    . Abdominal hysterectomy  unsure, over 10 yrs    partial, has had abnormal pap    Prior to Admission medications   Medication Sig Start Date End Date Taking? Authorizing Provider  aspirin 81 MG tablet Take 81 mg by mouth daily.   Yes Historical Provider, MD  calcium carbonate (TUMS - DOSED IN MG ELEMENTAL CALCIUM) 500 MG chewable tablet Two gummy calcium supplements daily 07/16/13  Yes [name], MD  Multiple Vitamin (MULTIVITAMIN) capsule Take 1 capsule by mouth daily.   Yes Historical Provider, MD  ibuprofen (ADVIL,MOTRIN) 600 MG tablet One twice daily for 5 days, then as needed 07/16/13   [name], MD    Allergies as of 12/12/2013 - Review Complete 12/12/2013  Allergen Reaction Noted  . Sulfonamide derivatives  03/13/2007    Family History  Problem Relation Age of Onset  . Heart disease Father   . Colon cancer Neg Hx     History   Social History  . Marital Status: Married    Spouse Name: N/A    Number of Children: N/A  . Years of Education: N/A   Occupational History  . Not on file.   Social History Main Topics  . Smoking status: Never Smoker   . Smokeless tobacco: Not on file  . Alcohol Use: No  . Drug Use: No  . Sexual Activity: Not on file   Other Topics Concern  . Not on file   Social History Narrative  . No  narrative on file    Review of Systems: See HPI, otherwise negative ROS  Physical Exam: BP 116/84  Pulse 83  Temp(Src) 97.9 F (36.6 C) (Oral)  Resp 18  Ht 5\' 2"  (1.575 m)  Wt 130 lb (58.968 kg)  BMI 23.77 kg/m2  SpO2 98% General:   Alert,  Well-developed, well-nourished, pleasant and cooperative in NAD Head:  Normocephalic and atraumatic. Eyes:  Sclera clear, no icterus.   Conjunctiva pink. Ears:  Normal auditory acuity. Nose:  No deformity, discharge,  or lesions. Mouth:  No deformity or lesions, dentition normal. Neck:  Supple; no masses or thyromegaly. Lungs:  Clear throughout to auscultation.   No wheezes, crackles, or rhonchi. No acute distress. Heart:  Regular rate and rhythm; no murmurs, clicks, rubs,  or gallops. Abdomen:  Soft, nontender and nondistended. No masses, hepatosplenomegaly or hernias noted. Normal bowel sounds, without guarding, and without rebound.   Msk:  Symmetrical without gross deformities. Normal posture. Pulses:  Normal pulses noted. Extremities:  Without clubbing or edema. Neurologic:  Alert and  oriented x4;  grossly normal neurologically. Skin:  Intact without significant lesions or rashes. Cervical Nodes:  No significant cervical adenopathy. Psych:  Alert and cooperative. Normal mood and affect.  Impression/Plan: [name] is now here to undergo a screening colonoscopy. Average risk screening examination.  Risks, benefits, limitations, imponderables and alternatives regarding colonoscopy have been reviewed with the patient. Questions have been answered. All parties agreeable.     Notice:  This dictation was prepared with Dragon dictation along with smaller phrase technology. Any transcriptional errors that result from this process are unintentional and may not be corrected upon review.

## 2014-01-06 ENCOUNTER — Other Ambulatory Visit (HOSPITAL_COMMUNITY)
Admission: RE | Admit: 2014-01-06 | Discharge: 2014-01-06 | Disposition: A | Discharge status: Home / Self Care | Payer: BC Managed Care – PPO | Source: Ambulatory Visit | Attending: Family Medicine | Admitting: Family Medicine

## 2014-01-06 ENCOUNTER — Encounter: Payer: Self-pay | Admitting: Internal Medicine

## 2014-01-06 ENCOUNTER — Ambulatory Visit (INDEPENDENT_AMBULATORY_CARE_PROVIDER_SITE_OTHER): Payer: BC Managed Care – PPO | Admitting: Family Medicine

## 2014-01-06 ENCOUNTER — Encounter: Payer: Self-pay | Admitting: Family Medicine

## 2014-01-06 VITALS — BP 106/70 | HR 70 | Resp 18 | Ht 62.0 in | Wt 131.1 lb

## 2014-01-06 DIAGNOSIS — E785 Hyperlipidemia, unspecified: Secondary | ICD-10-CM

## 2014-01-06 DIAGNOSIS — Z0000 Encounter for general adult medical examination without abnormal findings: Secondary | ICD-10-CM | POA: Insufficient documentation

## 2014-01-06 DIAGNOSIS — Z23 Encounter for immunization: Secondary | ICD-10-CM

## 2014-01-06 DIAGNOSIS — M8580 Other specified disorders of bone density and structure, unspecified site: Secondary | ICD-10-CM

## 2014-01-06 DIAGNOSIS — Z01419 Encounter for gynecological examination (general) (routine) without abnormal findings: Secondary | ICD-10-CM | POA: Insufficient documentation

## 2014-01-06 DIAGNOSIS — N871 Moderate cervical dysplasia: Secondary | ICD-10-CM

## 2014-01-06 NOTE — Patient Instructions (Addendum)
F/u in 6 month, call if you need me me before  Fasting lipid, chem, 7, CBC, tSH and vit D  This week please  Please commit to daily exercise with light weight bearing for bone health also 1000 mg calcium per day with vit D  Pap sent     TdAp and flu vaccine today     Folow advice from Dr Gala Romney re diverticulosis  Continue commitment to good health , it does pay off!

## 2014-01-07 LAB — CYTOLOGY - PAP

## 2014-01-10 DIAGNOSIS — Z23 Encounter for immunization: Secondary | ICD-10-CM | POA: Insufficient documentation

## 2014-01-10 NOTE — Assessment & Plan Note (Signed)
Vaccine administered at visit.  

## 2014-01-10 NOTE — Assessment & Plan Note (Signed)
Pap sent ?

## 2014-01-10 NOTE — Progress Notes (Signed)
   Subjective:    Patient ID: [name], female    DOB: 04-Aug-1950, 63 y.o.   MRN: [id number]  HPI  Patient is in for annual physical exam. Immunization needs are addressed Recent colonoscopy reviewed with pt and diverticulosis discussed She has no concerns or complaints Review of Systems See HPI     Objective:   Physical Exam  BP 106/70  Pulse 70  Resp 18  Ht 5\' 2"  (1.575 m)  Wt 131 lb 1.9 oz (59.476 kg)  BMI 23.98 kg/m2  SpO2 100% Pleasant well nourished female, alert and oriented x 3, in no cardio-pulmonary distress. Afebrile. HEENT No facial trauma or asymetry. Sinuses non tender.  Extra occullar muscles intact, pupils equally reactive to light. External ears normal, tympanic membranes clear. Oropharynx moist, no exudate, good dentition. Neck: supple, no adenopathy,JVD or thyromegaly.No bruits.  Chest: Clear to ascultation bilaterally.No crackles or wheezes. Non tender to palpation  Breast: No asymetry,no masses or lumps. No tenderness. No nipple discharge or inversion. No axillary or supraclavicular adenopathy  Cardiovascular system; Heart sounds normal,  S1 and  S2 ,no S3.  No murmur, or thrill. Apical beat not displaced Peripheral pulses normal.  Abdomen: Soft, non tender, no organomegaly or masses. No bruits. Bowel sounds normal. No guarding, tenderness or rebound.  Rectal:    Deferred , had colonoscopy 1 day ago, not indicated at this visit.  GU: External genitalia normal female genitalia , female distribution of hair. No lesions. Urethral meatus normal in size, no  Prolapse, no lesions visibly  Present. Bladder non tender. Vagina pink and moist , with no visible lesions , discharge present . Adequate pelvic support no  cystocele or rectocele noted  Uterus absent,, no adnexal masses, no adnexal tenderness   Musculoskeletal exam: Full ROM of spine, hips , shoulders and knees. No deformity ,swelling or crepitus noted. No muscle wasting  or atrophy.   Neurologic: Cranial nerves 2 to 12 intact. Power, tone ,sensation and reflexes normal throughout. No disturbance in gait. No tremor.  Skin: Intact, no ulceration, erythema , scaling or rash noted. Pigmentation normal throughout  Psych; Normal mood and affect. Judgement and concentration normal       Assessment & Plan:  Annual physical exam Annual exam as documented. Counseling done  re healthy lifestyle involving commitment to 150 minutes exercise per week, heart healthy diet, and attaining healthy weight.The importance of adequate sleep also discussed. Regular seat belt use and home safety, is also discussed. Changes in health habits are decided on by the patient with goals and time frames  set for achieving them. Immunization and cancer screening needs are specifically addressed at this visit.   Need for prophylactic vaccination and inoculation against influenza Vaccine administered at visit.   Need for Tdap vaccination Vaccine administered at visit.   Moderate dysplasia of cervix (CIN II) Pap sent

## 2014-01-10 NOTE — Assessment & Plan Note (Signed)

## 2014-01-19 ENCOUNTER — Other Ambulatory Visit: Payer: Self-pay | Admitting: Family Medicine

## 2014-01-19 DIAGNOSIS — E785 Hyperlipidemia, unspecified: Secondary | ICD-10-CM

## 2014-01-19 DIAGNOSIS — R7301 Impaired fasting glucose: Secondary | ICD-10-CM

## 2014-01-19 LAB — LIPID PANEL
Cholesterol: 144 mg/dL (ref 0–200)
HDL: 29 mg/dL — ABNORMAL LOW (ref >39)
LDL Cholesterol: 63 mg/dL (ref 0–99)
Total CHOL/HDL Ratio: 5 Ratio
Triglycerides: 261 mg/dL — ABNORMAL HIGH (ref <150)
VLDL: 52 mg/dL — ABNORMAL HIGH (ref 0–40)

## 2014-01-19 LAB — CBC
HCT: 35.6 % — ABNORMAL LOW (ref 36.0–46.0)
Hemoglobin: 11.7 g/dL — ABNORMAL LOW (ref 12.0–15.0)
MCH: 27.3 pg (ref 26.0–34.0)
MCHC: 32.9 g/dL (ref 30.0–36.0)
MCV: 83.2 fL (ref 78.0–100.0)
Platelets: 183 10*3/uL (ref 150–400)
RBC: 4.28 MIL/uL (ref 3.87–5.11)
RDW: 14.2 % (ref 11.5–15.5)
WBC: 4.2 10*3/uL (ref 4.0–10.5)

## 2014-01-19 LAB — BASIC METABOLIC PANEL
BUN: 18 mg/dL (ref 6–23)
CO2: 29 mEq/L (ref 19–32)
Calcium: 9.3 mg/dL (ref 8.4–10.5)
Chloride: 105 mEq/L (ref 96–112)
Creat: 1.23 mg/dL — ABNORMAL HIGH (ref 0.50–1.10)
Glucose, Bld: 108 mg/dL — ABNORMAL HIGH (ref 70–99)
Potassium: 4.5 mEq/L (ref 3.5–5.3)
Sodium: 139 mEq/L (ref 135–145)

## 2014-01-19 LAB — TSH: TSH: 1.972 u[IU]/mL (ref 0.350–4.500)

## 2014-01-20 LAB — VITAMIN D 25 HYDROXY (VIT D DEFICIENCY, FRACTURES): Vit D, 25-Hydroxy: 50 ng/mL (ref 30–89)

## 2014-01-21 LAB — IRON: Iron: 85 ug/dL (ref 42–145)

## 2014-01-21 LAB — FERRITIN: Ferritin: 273 ng/mL (ref 10–291)

## 2014-01-21 LAB — HEMOGLOBIN A1C
Hgb A1c MFr Bld: 6.4 % — ABNORMAL HIGH (ref <5.7)
Mean Plasma Glucose: 137 mg/dL — ABNORMAL HIGH (ref <117)

## 2014-01-22 NOTE — Addendum Note (Signed)
Addended by: Eual Fines on: 01/22/2014 09:16 AM   Modules accepted: Orders

## 2014-04-25 ENCOUNTER — Telehealth: Payer: Self-pay | Admitting: Family Medicine

## 2014-04-25 NOTE — Telephone Encounter (Signed)
Pt c/o d/ry cough, ear pain, red watery eyes, excess sneezing , Feeling warm" started symptoms 1 week ago She will use robitussin HC which she has at hom and is to be seen on 02/01 at 10 am as a work in

## 2014-04-27 ENCOUNTER — Encounter: Payer: Self-pay | Admitting: Family Medicine

## 2014-04-27 ENCOUNTER — Ambulatory Visit (INDEPENDENT_AMBULATORY_CARE_PROVIDER_SITE_OTHER): Payer: BC Managed Care – PPO | Admitting: Family Medicine

## 2014-04-27 VITALS — BP 110/70 | HR 74 | Resp 16 | Ht 62.0 in | Wt 131.0 lb

## 2014-04-27 DIAGNOSIS — J309 Allergic rhinitis, unspecified: Secondary | ICD-10-CM | POA: Insufficient documentation

## 2014-04-27 DIAGNOSIS — M542 Cervicalgia: Secondary | ICD-10-CM

## 2014-04-27 DIAGNOSIS — R05 Cough: Secondary | ICD-10-CM

## 2014-04-27 DIAGNOSIS — H6121 Impacted cerumen, right ear: Secondary | ICD-10-CM

## 2014-04-27 DIAGNOSIS — H6120 Impacted cerumen, unspecified ear: Secondary | ICD-10-CM | POA: Insufficient documentation

## 2014-04-27 DIAGNOSIS — R058 Other specified cough: Secondary | ICD-10-CM | POA: Insufficient documentation

## 2014-04-27 DIAGNOSIS — H1010 Acute atopic conjunctivitis, unspecified eye: Secondary | ICD-10-CM | POA: Insufficient documentation

## 2014-04-27 DIAGNOSIS — H1013 Acute atopic conjunctivitis, bilateral: Secondary | ICD-10-CM

## 2014-04-27 MED ORDER — PREDNISONE 5 MG PO TABS: 5.0000 mg | ORAL_TABLET | Freq: Two times a day (BID) | ORAL | Status: AC

## 2014-04-27 MED ORDER — PROMETHAZINE-DM 6.25-15 MG/5ML PO SYRP: ORAL_SOLUTION | ORAL | Status: DC

## 2014-04-27 MED ORDER — MONTELUKAST SODIUM 10 MG PO TABS: 10.0000 mg | ORAL_TABLET | Freq: Every day | ORAL | Status: DC

## 2014-04-27 MED ORDER — OLOPATADINE HCL 0.1 % OP SOLN: 1.0000 [drp] | Freq: Two times a day (BID) | OPHTHALMIC | Status: DC

## 2014-04-27 NOTE — Progress Notes (Signed)
   Subjective:    Patient ID: [name], female    DOB: 1950/09/06, 64 y.o.   MRN: [id number]  HPI 2 week h/o dry cough , no fevr or cills, no sputum , does have post nasal drainage which is clear, also c/o pink eyes, no discomfort or gritty feeling , no drainage , no vision change C/o increased and uncontrolled hand pain , right much  Greater than ;left associated with weakness , unable to grip.Pain radiaites from neck down right upper extremity to hand , like shocking electric pains, has been present in the past but worse, uncertain if due to uncomfortable sleeping position now that she is caring for her ailing sibling C/o increased rigth ear pressure and discomfort, no drainage from ear  , does use q tips at times, no hearing loss    Review of Systems See HPI Denies chest pains, palpitations and leg swelling Denies abdominal pain, nausea, vomiting,diarrhea or constipation.   Denies dysuria, frequency, hesitancy or incontinence.  Denies headaches, seizures,  Does have pain and  Tingling in right hand Denies depression, dies have increased  anxiety and mild  Insomnia due to her sister's poor health as well as to uncontrolled hand pain Denies skin break down or rash.        Objective:   Physical Exam BP 110/70 mmHg  Pulse 74  Resp 16  Ht 5\' 2"  (1.575 m)  Wt 131 lb (59.421 kg)  BMI 23.95 kg/m2  SpO2 99% Patient alert and oriented and in no cardiopulmonary distress.  HEENT: No facial asymmetry, EOMI,   oropharynx pink and moist.  Neck  Decreased ROM,no JVD, no mass. Nasal mucosa mildly erythematous and edematous, no sinus tenderness, right eardrum occluded by wax, left eardrum is clear. Mild bilateral conjunctival erythema , no drainage Chest: Clear to auscultation bilaterally.  CVS: S1, S2 no murmurs, no S3.Regular rate.  ABD: Soft non tender.   Ext: No edema  MS: decreased  ROM cervical spine  , adequate in  shoulders, hips and knees.No thenar wasting, negative  tinnels sign  Skin: Intact, no ulcerations or rash noted.  Psych: Good eye contact, normal affect. Memory intact not anxious or depressed appearing.  CNS: CN 2-12 intact,grade 4 power in right grip, otherwise normal throughout. Sensation intact in all 4 extremities       Assessment & Plan:  Allergic conjunctivitis and rhinitis Short course of oral steroid as well as allergy eye drops, pt to call back if no improvement/ resolution   Allergic cough Increased and uncontrolled, start daily singulair and cough suppressant for nocturnal use also prescribed Call if fever , chills or sputum production develop   Neck pain on right side Worsening in past week, has established severe lumbar disease, has right upper extremity weakness on exam and pain is debilitating. MRI of c spine recommended, pt to call back if she decides to have this done   Cerumen impaction Right TM occluded, attempts at irrigation unsuccessful, pt advised to use wax softener

## 2014-04-27 NOTE — Patient Instructions (Signed)
F/u as before, pls get labs approximately 5 days before visit   You are in my thoughts  as you care for your ailing sister  Medication is prescribed for allergic conjunctivitis , and cough due to uncontrolled allergies  Pls call back if cough persists or any of your symptoms worsen  The weakness and pain in the hands I believe is from arthritis/ disc disease in your neck, pinching  A nerve.  Since you have weakness, it is important to fully evaluate this, and pls do call back if these Symptoms persist, so that recommended tests can be done  Right ear is irrigated as there is wax present which is likely the cause of the discomfort, also NEVER use Q tips in the ears

## 2014-05-05 ENCOUNTER — Telehealth: Payer: Self-pay | Admitting: *Deleted

## 2014-05-05 NOTE — Telephone Encounter (Signed)
Had wax in the ear which was too hard to flush advise her to use roftener in the ear and arrange for a flush. Also I will prescribe auralgan for ear pain I had discussed MRI of c spine seee if willing to get this done , fopr pain se4nd in tramadol 50mg  one twice daily as needed #30  For auralga, prescribe 1 drop to affected ear 3 times daily for 5 days  Pls send me backmsg if needed

## 2014-05-05 NOTE — Telephone Encounter (Signed)
See previous message

## 2014-05-05 NOTE — Telephone Encounter (Signed)
Pt called requesting something for the pain in her hands, arms, and neck, pt said she was seen last Thursday and pt said her ear is also still hurting. Please advise

## 2014-05-05 NOTE — Telephone Encounter (Signed)
Pt called [facility] stating she is having pain in her neck and arms and handsd, pt also said her left ear is hurting. Pt is requesting something for the pain. Please advise

## 2014-05-05 NOTE — Addendum Note (Signed)
Addended by: Denman George B on: 05/05/2014 04:01 PM   Modules accepted: Orders

## 2014-05-05 NOTE — Telephone Encounter (Signed)
Please advise 

## 2014-05-05 NOTE — Telephone Encounter (Signed)
Patient aware and will mail her instructions for ear flush and softener use.  Meds sent to pharmacy for pain.  She would like to hold on MRI at this time.  She also states that she is still having eye redness.  Please advise.

## 2014-05-06 NOTE — Telephone Encounter (Signed)
Advise d pt to call her eye specialist about  Persistent pink eye, she denies drainage or pain

## 2014-05-11 ENCOUNTER — Other Ambulatory Visit: Payer: Self-pay | Admitting: Family Medicine

## 2014-05-11 ENCOUNTER — Encounter: Payer: Self-pay | Admitting: Family Medicine

## 2014-05-11 DIAGNOSIS — M542 Cervicalgia: Secondary | ICD-10-CM

## 2014-05-11 DIAGNOSIS — R29898 Other symptoms and signs involving the musculoskeletal system: Secondary | ICD-10-CM

## 2014-05-11 NOTE — Assessment & Plan Note (Signed)
Right TM occluded, attempts at irrigation unsuccessful, pt advised to use wax softener

## 2014-05-11 NOTE — Assessment & Plan Note (Signed)
Worsening in past week, has established severe lumbar disease, has right upper extremity weakness on exam and pain is debilitating. MRI of c spine recommended, pt to call back if she decides to have this done

## 2014-05-11 NOTE — Assessment & Plan Note (Signed)
Short course of oral steroid as well as allergy eye drops, pt to call back if no improvement/ resolution

## 2014-05-11 NOTE — Assessment & Plan Note (Signed)
Increased and uncontrolled, start daily singulair and cough suppressant for nocturnal use also prescribed Call if fever , chills or sputum production develop

## 2014-05-14 ENCOUNTER — Other Ambulatory Visit: Payer: Self-pay

## 2014-05-14 MED ORDER — DIAZEPAM 5 MG PO TABS: 5.0000 mg | ORAL_TABLET | ORAL | Status: DC

## 2014-05-18 ENCOUNTER — Ambulatory Visit (HOSPITAL_COMMUNITY)
Admission: RE | Admit: 2014-05-18 | Discharge: 2014-05-18 | Disposition: A | Discharge status: Home / Self Care | Payer: BC Managed Care – PPO | Source: Ambulatory Visit | Attending: Family Medicine | Admitting: Family Medicine

## 2014-05-18 DIAGNOSIS — M542 Cervicalgia: Secondary | ICD-10-CM | POA: Diagnosis not present

## 2014-05-18 DIAGNOSIS — R29898 Other symptoms and signs involving the musculoskeletal system: Secondary | ICD-10-CM | POA: Diagnosis not present

## 2014-05-18 DIAGNOSIS — M79601 Pain in right arm: Secondary | ICD-10-CM | POA: Diagnosis not present

## 2014-05-22 ENCOUNTER — Other Ambulatory Visit: Payer: Self-pay

## 2014-05-22 ENCOUNTER — Other Ambulatory Visit: Payer: Self-pay | Admitting: Family Medicine

## 2014-05-22 DIAGNOSIS — R52 Pain, unspecified: Secondary | ICD-10-CM

## 2014-05-22 DIAGNOSIS — M542 Cervicalgia: Secondary | ICD-10-CM

## 2014-05-22 DIAGNOSIS — M329 Systemic lupus erythematosus, unspecified: Secondary | ICD-10-CM

## 2014-05-22 DIAGNOSIS — R937 Abnormal findings on diagnostic imaging of other parts of musculoskeletal system: Secondary | ICD-10-CM

## 2014-05-29 ENCOUNTER — Telehealth: Payer: Self-pay

## 2014-05-29 DIAGNOSIS — M5440 Lumbago with sciatica, unspecified side: Secondary | ICD-10-CM

## 2014-06-02 ENCOUNTER — Other Ambulatory Visit: Payer: Self-pay | Admitting: Family Medicine

## 2014-06-02 DIAGNOSIS — M5441 Lumbago with sciatica, right side: Secondary | ICD-10-CM

## 2014-06-02 DIAGNOSIS — M5442 Lumbago with sciatica, left side: Principal | ICD-10-CM

## 2014-06-02 NOTE — Telephone Encounter (Signed)
She mat be referred to pain specialist of her choice, I suggest Dr Rosalyn Charters / [place], whoever is in network and soonest unless she has a preference. I will sign. Pls explain to her that my concern re need of neurosurgeon to eval her also is because she is experiencing weakness and altered sensation in her upper extremity which implies significant nerve pressure which may require surgery to prevent further nerve damage which is irreversible, The epidural hopefully will provide temporary pain relief but not change the other symptoms. Let me know if furhter ??/ Also if wants diff neurosurgeon and has specific one Ok to refer  As long a in network,

## 2014-06-02 NOTE — Telephone Encounter (Signed)
Patient will be seen at [place] on 3/9

## 2014-06-02 NOTE — Telephone Encounter (Signed)
Referral entered for [facility].  Patient states that she does not want a surgical consult and would prefer to have only epidural at this time.

## 2014-06-03 ENCOUNTER — Other Ambulatory Visit: Payer: Self-pay | Admitting: Family Medicine

## 2014-06-03 ENCOUNTER — Ambulatory Visit
Admission: RE | Admit: 2014-06-03 | Discharge: 2014-06-03 | Disposition: A | Discharge status: Home / Self Care | Payer: BC Managed Care – PPO | Source: Ambulatory Visit | Attending: Family Medicine | Admitting: Family Medicine

## 2014-06-03 DIAGNOSIS — M5440 Lumbago with sciatica, unspecified side: Secondary | ICD-10-CM

## 2014-06-03 DIAGNOSIS — M5442 Lumbago with sciatica, left side: Secondary | ICD-10-CM

## 2014-06-03 DIAGNOSIS — M5441 Lumbago with sciatica, right side: Secondary | ICD-10-CM

## 2014-06-03 MED ORDER — IOHEXOL 180 MG/ML  SOLN: 1.0000 mL | Freq: Once | INTRAMUSCULAR | Status: AC | PRN

## 2014-06-03 MED ORDER — METHYLPREDNISOLONE ACETATE 40 MG/ML INJ SUSP (RADIOLOG: 120.0000 mg | Freq: Once | INTRAMUSCULAR | Status: DC

## 2014-06-03 NOTE — Discharge Instructions (Signed)

## 2014-06-04 ENCOUNTER — Other Ambulatory Visit: Payer: Self-pay | Admitting: Family Medicine

## 2014-06-04 ENCOUNTER — Encounter: Payer: Self-pay | Admitting: Family Medicine

## 2014-06-04 DIAGNOSIS — R52 Pain, unspecified: Secondary | ICD-10-CM

## 2014-06-04 DIAGNOSIS — M329 Systemic lupus erythematosus, unspecified: Secondary | ICD-10-CM

## 2014-07-01 ENCOUNTER — Ambulatory Visit: Payer: BC Managed Care – PPO | Admitting: Family Medicine

## 2014-07-06 ENCOUNTER — Ambulatory Visit: Payer: BC Managed Care – PPO | Admitting: Family Medicine

## 2014-08-12 ENCOUNTER — Telehealth: Payer: Self-pay | Admitting: Family Medicine

## 2014-08-12 DIAGNOSIS — R7303 Prediabetes: Secondary | ICD-10-CM

## 2014-08-12 DIAGNOSIS — E785 Hyperlipidemia, unspecified: Secondary | ICD-10-CM

## 2014-08-12 NOTE — Addendum Note (Signed)
Addended by: Denman George B on: 08/12/2014 09:07 AM   Modules accepted: Orders

## 2014-08-12 NOTE — Telephone Encounter (Signed)
Patient aware and will have labs done by 5/24.  Lab order faxed to [facility].

## 2014-08-12 NOTE — Telephone Encounter (Signed)
Pt needs fasting lipid, cnp and HBa1C wby next week Tuesday if possible, though her appt is not till August, her HBa1C was 6.4 in 12/2013, pls order and let her know

## 2014-08-17 LAB — COMPREHENSIVE METABOLIC PANEL
ALT: 15 U/L (ref 0–35)
AST: 16 U/L (ref 0–37)
Albumin: 3.5 g/dL (ref 3.5–5.2)
Alkaline Phosphatase: 37 U/L — ABNORMAL LOW (ref 39–117)
BUN: 21 mg/dL (ref 6–23)
CO2: 29 mEq/L (ref 19–32)
Calcium: 9.2 mg/dL (ref 8.4–10.5)
Chloride: 106 mEq/L (ref 96–112)
Creat: 1.2 mg/dL — ABNORMAL HIGH (ref 0.50–1.10)
Glucose, Bld: 87 mg/dL (ref 70–99)
Potassium: 4.3 mEq/L (ref 3.5–5.3)
Sodium: 143 mEq/L (ref 135–145)
Total Bilirubin: 0.5 mg/dL (ref 0.2–1.2)
Total Protein: 6.7 g/dL (ref 6.0–8.3)

## 2014-08-17 LAB — LIPID PANEL
Cholesterol: 220 mg/dL — ABNORMAL HIGH (ref 0–200)
HDL: 38 mg/dL — ABNORMAL LOW (ref >=46)
LDL Cholesterol: 134 mg/dL — ABNORMAL HIGH (ref 0–99)
Total CHOL/HDL Ratio: 5.8 Ratio
Triglycerides: 242 mg/dL — ABNORMAL HIGH (ref <150)
VLDL: 48 mg/dL — ABNORMAL HIGH (ref 0–40)

## 2014-08-17 LAB — HEMOGLOBIN A1C
Hgb A1c MFr Bld: 6.7 % — ABNORMAL HIGH (ref <5.7)
Mean Plasma Glucose: 146 mg/dL — ABNORMAL HIGH (ref <117)

## 2014-11-12 ENCOUNTER — Encounter: Payer: Self-pay | Admitting: Family Medicine

## 2014-11-12 ENCOUNTER — Ambulatory Visit (INDEPENDENT_AMBULATORY_CARE_PROVIDER_SITE_OTHER): Payer: BC Managed Care – PPO | Admitting: Family Medicine

## 2014-11-12 VITALS — BP 118/80 | HR 88 | Resp 16 | Ht 62.0 in | Wt 138.0 lb

## 2014-11-12 DIAGNOSIS — E785 Hyperlipidemia, unspecified: Secondary | ICD-10-CM | POA: Diagnosis not present

## 2014-11-12 DIAGNOSIS — R7309 Other abnormal glucose: Secondary | ICD-10-CM

## 2014-11-12 DIAGNOSIS — D509 Iron deficiency anemia, unspecified: Secondary | ICD-10-CM | POA: Diagnosis not present

## 2014-11-12 DIAGNOSIS — Z1159 Encounter for screening for other viral diseases: Secondary | ICD-10-CM

## 2014-11-12 DIAGNOSIS — R7303 Prediabetes: Secondary | ICD-10-CM

## 2014-11-12 DIAGNOSIS — M329 Systemic lupus erythematosus, unspecified: Secondary | ICD-10-CM

## 2014-11-12 DIAGNOSIS — R7301 Impaired fasting glucose: Secondary | ICD-10-CM | POA: Diagnosis not present

## 2014-11-12 DIAGNOSIS — M542 Cervicalgia: Secondary | ICD-10-CM

## 2014-11-12 NOTE — Progress Notes (Signed)
Subjective:    Patient ID: [name], female    DOB: 1951-02-02, 64 y.o.   MRN: [id number]  HPI The PT is here for follow up and re-evaluation of chronic medical conditions, medication management and review of any available recent lab and radiology data.  Preventive health is updated, specifically  Cancer screening and Immunization.   Good response to generalized joint pains which started in early March, and by March 22, she started on prednisone 40 mg daily through rheuamatology, has established dx of SLE since approx  1984. No neck and upper extremity pain  Completed prednisone taper on June 21, Joint pains are resolvedThe PT denies any adverse reactions to current medications she hqas no prescription meds on a regular basis.  There are no new concerns.  There are no specific complaints       Review of Systems    See HPI Denies recent fever or chills. Denies sinus pressure, nasal congestion, ear pain or sore throat. Denies chest congestion, productive cough or wheezing. Denies chest pains, palpitations and leg swelling Denies abdominal pain, nausea, vomiting,diarrhea or constipation.   Denies dysuria, frequency, hesitancy or incontinence. Denies joint pain, swelling and limitation in mobility. Denies headaches, seizures, numbness, or tingling. Denies depression, anxiety or insomnia. Denies skin break down or rash.     Objective:   Physical Exam  BP 118/80 mmHg  Pulse 88  Resp 16  Ht 5\' 2"  (1.575 m)  Wt 138 lb (62.596 kg)  BMI 25.23 kg/m2  SpO2 97% Patient alert and oriented and in no cardiopulmonary distress.  HEENT: No facial asymmetry, EOMI,   oropharynx pink and moist.  Neck supple no JVD, no mass.  Chest: Clear to auscultation bilaterally.  CVS: S1, S2 no murmurs, no S3.Regular rate.  ABD: Soft non tender.   Ext: No edema  MS: Adequate ROM spine, shoulders, hips and knees.  Skin: Intact, no ulcerations or rash noted.  Psych: Good eye contact,  normal affect. Memory intact not anxious or depressed appearing.  CNS: CN 2-12 intact, power,  normal throughout.no focal deficits noted.       Assessment & Plan:  Dyslipidemia Hyperlipidemia:Low fat diet discussed and encouraged.   Lipid Panel  Lab Results  Component Value Date   CHOL 220* 08/17/2014   HDL 38* 08/17/2014   LDLCALC 134* 08/17/2014   TRIG 242* 08/17/2014   CHOLHDL 5.8 08/17/2014      Updated lab needed at/ before next visit.   Prediabetes Patient educated about the importance of limiting  Carbohydrate intake , the need to commit to daily physical activity for a minimum of 30 minutes , and to commit weight loss. The fact that changes in all these areas will reduce or eliminate all together the development of diabetes is stressed.   Diabetic Labs Latest Ref Rng 08/17/2014 01/19/2014 12/12/2012 10/09/2011 02/12/2010  HbA1c <5.7 % 6.7(H) 6.4(H) - - -  Chol 0 - 200 mg/dL 220(H) 144 156 158 181  HDL >=46 mg/dL 38(L) 29(L) 31(L) 29(L) 30(L)  Calc LDL 0 - 99 mg/dL 134(H) 63 65 81 125(H)  Triglycerides <150 mg/dL 242(H) 261(H) 298(H) 239(H) 131  Creatinine 0.50 - 1.10 mg/dL 1.20(H) 1.23(H) 1.12(H) 1.10 1.14   BP/Weight 11/12/2014 04/27/2014 01/06/2014 01/05/2014 11/12/2013 07/16/2013 2/83/6629  Systolic BP 476 546 503 94 546 568 127  Diastolic BP 80 70 70 56 80 80 78  Wt. (Lbs) 138 131 131.12 130 131.12 131.4 130.4  BMI 25.23 23.95 23.98 23.77 23.98 24.03 23.84  No flowsheet data found. Deteriorated, however, recently on very high doses of prednisone    SLE (systemic lupus erythematosus) Recently had flare of generalized arthralgia, excellent response to high doses of prednisone starting at 40 mg now d/c x 1 month Recommend annual f/u with rheumatology  Anemia Updated lab needed at/ before next visit.   Neck pain on right side Currently resolved, however she does have significant disease which may require surgical intervention, no interest in following  through on this recommendation which was made sveral,nonths ago, power and sensation are normal in RUE  DIVERTICULOSIS, COLON, HX OF No recent abdominal pain or rectal bleeding, stable

## 2014-11-12 NOTE — Patient Instructions (Signed)
Physical in January, call if you need me before  Thankful you feel much better   Please work on good  health habits so that your health will improve. 1. Commitment to daily physical activity for 30 to 60  minutes, if you are able to do this.  2. Commitment to wise food choices. Aim for half of your  food intake to be vegetable and fruit, one quarter starchy foods, and one quarter protein. Try to eat on a regular schedule  3 meals per day, snacking between meals should be limited to vegetables or fruits or small portions of nuts. 64 ounces of water per day is generally recommended, unless you have specific health conditions, like heart failure or kidney failure where you will need to limit fluid intake.  3. Commitment to sufficient and a  good quality of physical and mental rest daily, generally between 6 to 8 hours per day.  WITH PERSISTANCE AND PERSEVERANCE, THE IMPOSSIBLE , BECOMES THE NORM!   Thanks for choosing [facility], we consider it a privelige to serve you.   Please reduce fat, sugar and carbs, eat more veg and fruit , fresh/frozen  Fasting labs in January

## 2014-11-15 DIAGNOSIS — R7303 Prediabetes: Secondary | ICD-10-CM | POA: Insufficient documentation

## 2014-11-15 NOTE — Assessment & Plan Note (Signed)
Currently resolved, however she does have significant disease which may require surgical intervention, no interest in following through on this recommendation which was made sveral,nonths ago, power and sensation are normal in RUE

## 2014-11-15 NOTE — Assessment & Plan Note (Signed)
No recent abdominal pain or rectal bleeding, stable

## 2014-11-15 NOTE — Assessment & Plan Note (Signed)
Patient educated about the importance of limiting  Carbohydrate intake , the need to commit to daily physical activity for a minimum of 30 minutes , and to commit weight loss. The fact that changes in all these areas will reduce or eliminate all together the development of diabetes is stressed.   Diabetic Labs Latest Ref Rng 08/17/2014 01/19/2014 12/12/2012 10/09/2011 02/12/2010  HbA1c <5.7 % 6.7(H) 6.4(H) - - -  Chol 0 - 200 mg/dL 220(H) 144 156 158 181  HDL >=46 mg/dL 38(L) 29(L) 31(L) 29(L) 30(L)  Calc LDL 0 - 99 mg/dL 134(H) 63 65 81 125(H)  Triglycerides <150 mg/dL 242(H) 261(H) 298(H) 239(H) 131  Creatinine 0.50 - 1.10 mg/dL 1.20(H) 1.23(H) 1.12(H) 1.10 1.14   BP/Weight 11/12/2014 04/27/2014 01/06/2014 01/05/2014 11/12/2013 07/16/2013 09/20/348  Systolic BP 093 818 299 94 371 696 789  Diastolic BP 80 70 70 56 80 80 78  Wt. (Lbs) 138 131 131.12 130 131.12 131.4 130.4  BMI 25.23 23.95 23.98 23.77 23.98 24.03 23.84   No flowsheet data found. Deteriorated, however, recently on very high doses of prednisone

## 2014-11-15 NOTE — Assessment & Plan Note (Signed)
Updated lab needed at/ before next visit.   

## 2014-11-15 NOTE — Assessment & Plan Note (Signed)
Hyperlipidemia:Low fat diet discussed and encouraged.   Lipid Panel  Lab Results  Component Value Date   CHOL 220* 08/17/2014   HDL 38* 08/17/2014   LDLCALC 134* 08/17/2014   TRIG 242* 08/17/2014   CHOLHDL 5.8 08/17/2014      Updated lab needed at/ before next visit.

## 2014-11-15 NOTE — Assessment & Plan Note (Signed)
Recently had flare of generalized arthralgia, excellent response to high doses of prednisone starting at 40 mg now d/c x 1 month Recommend annual f/u with rheumatology

## 2014-11-27 ENCOUNTER — Other Ambulatory Visit: Payer: Self-pay | Admitting: Family Medicine

## 2014-11-27 DIAGNOSIS — Z1231 Encounter for screening mammogram for malignant neoplasm of breast: Secondary | ICD-10-CM

## 2014-12-03 ENCOUNTER — Ambulatory Visit (HOSPITAL_COMMUNITY)
Admission: RE | Admit: 2014-12-03 | Discharge: 2014-12-03 | Disposition: A | Discharge status: Home / Self Care | Payer: BC Managed Care – PPO | Source: Ambulatory Visit | Attending: Family Medicine | Admitting: Family Medicine

## 2014-12-03 DIAGNOSIS — Z1231 Encounter for screening mammogram for malignant neoplasm of breast: Secondary | ICD-10-CM | POA: Insufficient documentation

## 2015-03-10 ENCOUNTER — Telehealth: Payer: Self-pay | Admitting: Family Medicine

## 2015-03-10 NOTE — Telephone Encounter (Signed)
Patient is calling stating that she has had a dry hacking cough for some time now, cough drops nor Nyquil has helped, please advise?

## 2015-03-10 NOTE — Telephone Encounter (Signed)
Cough x 3 weeks. Mostly dry but early in the am she does cough up some thick yellowish brown phlegm. No fever, chills or bodyaches. Has been using nyquil and cough drops but it hasn't helped much. Please advise

## 2015-03-10 NOTE — Telephone Encounter (Signed)
Called and left message for patient to return call.  

## 2015-03-10 NOTE — Telephone Encounter (Signed)
sonds as tho cough is from sinus drainage overnight , since eraly am brown, recommend daily allergy med eg claritin and netty pot flushes twice daily , since no fever, no antibiotic, call if fever etc for appt, recommend flu vac if none

## 2015-03-11 NOTE — Telephone Encounter (Signed)
Patient aware.

## 2015-04-07 ENCOUNTER — Encounter: Payer: BC Managed Care – PPO | Admitting: Family Medicine

## 2015-06-29 ENCOUNTER — Telehealth: Payer: Self-pay | Admitting: Family Medicine

## 2015-06-29 DIAGNOSIS — D509 Iron deficiency anemia, unspecified: Secondary | ICD-10-CM

## 2015-06-29 DIAGNOSIS — E785 Hyperlipidemia, unspecified: Secondary | ICD-10-CM

## 2015-06-29 DIAGNOSIS — R7303 Prediabetes: Secondary | ICD-10-CM

## 2015-06-29 NOTE — Telephone Encounter (Signed)
Lab orders placed and mailed to patient  

## 2015-06-29 NOTE — Telephone Encounter (Signed)
Patient is asking for new lab orders to be mailed to her, she is denying the HIV and Hep C, please advise?

## 2015-09-02 LAB — CBC WITH DIFFERENTIAL/PLATELET
Basophils Absolute: 36 cells/uL (ref 0–200)
Basophils Relative: 1 %
Eosinophils Absolute: 108 cells/uL (ref 15–500)
Eosinophils Relative: 3 %
HCT: 35.6 % (ref 35.0–45.0)
Hemoglobin: 11.6 g/dL — ABNORMAL LOW (ref 11.7–15.5)
Lymphocytes Relative: 36 %
Lymphs Abs: 1296 cells/uL (ref 850–3900)
MCH: 27.5 pg (ref 27.0–33.0)
MCHC: 32.6 g/dL (ref 32.0–36.0)
MCV: 84.4 fL (ref 80.0–100.0)
MPV: 11.6 fL (ref 7.5–12.5)
Monocytes Absolute: 432 cells/uL (ref 200–950)
Monocytes Relative: 12 %
Neutro Abs: 1728 cells/uL (ref 1500–7800)
Neutrophils Relative %: 48 %
Platelets: 161 10*3/uL (ref 140–400)
RBC: 4.22 MIL/uL (ref 3.80–5.10)
RDW: 14.6 % (ref 11.0–15.0)
WBC: 3.6 10*3/uL — ABNORMAL LOW (ref 3.8–10.8)

## 2015-09-02 LAB — HEMOGLOBIN A1C
Hgb A1c MFr Bld: 6.1 % — ABNORMAL HIGH (ref <5.7)
Mean Plasma Glucose: 128 mg/dL

## 2015-09-03 LAB — COMPLETE METABOLIC PANEL WITH GFR
ALT: 16 U/L (ref 6–29)
AST: 25 U/L (ref 10–35)
Albumin: 3.5 g/dL — ABNORMAL LOW (ref 3.6–5.1)
Alkaline Phosphatase: 33 U/L (ref 33–130)
BUN: 21 mg/dL (ref 7–25)
CO2: 23 mmol/L (ref 20–31)
Calcium: 9.3 mg/dL (ref 8.6–10.4)
Chloride: 106 mmol/L (ref 98–110)
Creat: 1.35 mg/dL — ABNORMAL HIGH (ref 0.50–0.99)
GFR, Est African American: 48 mL/min — ABNORMAL LOW (ref >=60)
GFR, Est Non African American: 42 mL/min — ABNORMAL LOW (ref >=60)
Glucose, Bld: 82 mg/dL (ref 65–99)
Potassium: 4.1 mmol/L (ref 3.5–5.3)
Sodium: 139 mmol/L (ref 135–146)
Total Bilirubin: 0.4 mg/dL (ref 0.2–1.2)
Total Protein: 7.1 g/dL (ref 6.1–8.1)

## 2015-09-03 LAB — LIPID PANEL
Cholesterol: 165 mg/dL (ref 125–200)
HDL: 36 mg/dL — ABNORMAL LOW (ref >=46)
LDL Cholesterol: 101 mg/dL (ref <130)
Total CHOL/HDL Ratio: 4.6 Ratio (ref <=5.0)
Triglycerides: 138 mg/dL (ref <150)
VLDL: 28 mg/dL (ref <30)

## 2015-09-03 LAB — TSH: TSH: 2.49 mIU/L

## 2015-09-07 ENCOUNTER — Ambulatory Visit (INDEPENDENT_AMBULATORY_CARE_PROVIDER_SITE_OTHER): Payer: BC Managed Care – PPO | Admitting: Family Medicine

## 2015-09-07 ENCOUNTER — Encounter: Payer: Self-pay | Admitting: Family Medicine

## 2015-09-07 VITALS — BP 102/64 | HR 72 | Resp 18 | Ht 62.0 in | Wt 131.0 lb

## 2015-09-07 DIAGNOSIS — Z0000 Encounter for general adult medical examination without abnormal findings: Secondary | ICD-10-CM

## 2015-09-07 DIAGNOSIS — Z124 Encounter for screening for malignant neoplasm of cervix: Secondary | ICD-10-CM

## 2015-09-07 DIAGNOSIS — Z1211 Encounter for screening for malignant neoplasm of colon: Secondary | ICD-10-CM | POA: Diagnosis not present

## 2015-09-07 DIAGNOSIS — R7303 Prediabetes: Secondary | ICD-10-CM

## 2015-09-07 DIAGNOSIS — D6489 Other specified anemias: Secondary | ICD-10-CM

## 2015-09-07 LAB — POC HEMOCCULT BLD/STL (OFFICE/1-CARD/DIAGNOSTIC): Fecal Occult Blood, POC: NEGATIVE

## 2015-09-07 NOTE — Patient Instructions (Addendum)
F/u in 4.5 month, call if you need me sooner  Thankful you are better  Labs have improved  Non fast labs in 4.5 months.  Recent labs are sent to Dr [name], as kidney function not 100% I want to be sure that he is aware  It is important that you exercise regularly at least 30 minutes 5 times a week. If you develop chest pain, have severe difficulty breathing, or feel very tired, stop exercising immediately and seek medical attention   Thank you  for choosing [facility]. We consider it a privelige to serve you.  Delivering excellent health care in a caring and  compassionate way is our goal.  Partnering with you,  so that together we can achieve this goal is our strategy.

## 2015-09-08 NOTE — Assessment & Plan Note (Signed)

## 2015-09-08 NOTE — Progress Notes (Signed)
   Subjective:    Patient ID: [name], female    DOB: 30-Jan-1951, 65 y.o.   MRN: [id number]  HPI  Patient is in for annual physical exam. No other health concerns are expressed or addressed at the visit. Recent labs, if available are reviewed. Immunization is reviewed , and  updated if needed.   Review of Systems    See HPI  Objective:   Physical Exam BP 102/64 mmHg  Pulse 72  Resp 18  Ht 5\' 2"  (1.575 m)  Wt 131 lb (59.421 kg)  BMI 23.95 kg/m2  SpO2 100%  Pleasant well nourished female, alert and oriented x 3, in no cardio-pulmonary distress. Afebrile. HEENT No facial trauma or asymetry. Sinuses non tender.  Extra occullar muscles intact, pupils equally reactive to light. External ears normal, tympanic membranes clear. Oropharynx moist, no exudate, fairly good dentition. Neck: supple, no adenopathy,JVD or thyromegaly.No bruits.  Chest: Clear to ascultation bilaterally.No crackles or wheezes. Non tender to palpation  Breast: No asymetry,no masses or lumps. No tenderness. No nipple discharge or inversion. No axillary or supraclavicular adenopathy  Cardiovascular system; Heart sounds normal,  S1 and  S2 ,no S3.  No murmur, or thrill. Apical beat not displaced Peripheral pulses normal.  Abdomen: Soft, non tender, no organomegaly or masses. No bruits. Bowel sounds normal. No guarding, tenderness or rebound.  Rectal:  Normal sphincter tone. No mass.No rectal masses.  Guaiac negative stool.  GU: External genitalia normal female genitalia , female distribution of hair. No lesions. Urethral meatus normal in size, no  Prolapse, no lesions visibly  Present. Bladder non tender. Vagina pink and moist , with no visible lesions , discharge present . Adequate pelvic support no  cystocele or rectocele noted Cervix absent Uterus absent, no adnexal masses, no adnexal tenderness.   Musculoskeletal exam: Full ROM of spine, hips , shoulders and knees. No  deformity ,swelling or crepitus noted. No muscle wasting or atrophy.   Neurologic: Cranial nerves 2 to 12 intact. Power, tone ,sensation and reflexes normal throughout. No disturbance in gait. No tremor.  Skin: Intact, no ulceration, erythema , scaling or rash noted. Pigmentation normal throughout  Psych; Normal mood and affect. Judgement and concentration normal       Assessment & Plan:

## 2015-09-10 LAB — CYTOLOGY - PAP

## 2015-12-17 ENCOUNTER — Other Ambulatory Visit: Payer: Self-pay | Admitting: Family Medicine

## 2015-12-17 DIAGNOSIS — Z1231 Encounter for screening mammogram for malignant neoplasm of breast: Secondary | ICD-10-CM

## 2015-12-22 ENCOUNTER — Ambulatory Visit (HOSPITAL_COMMUNITY)
Admission: RE | Admit: 2015-12-22 | Discharge: 2015-12-22 | Disposition: A | Discharge status: Home / Self Care | Payer: BC Managed Care – PPO | Source: Ambulatory Visit | Attending: Family Medicine | Admitting: Family Medicine

## 2015-12-22 DIAGNOSIS — Z1231 Encounter for screening mammogram for malignant neoplasm of breast: Secondary | ICD-10-CM | POA: Diagnosis not present

## 2016-02-07 ENCOUNTER — Ambulatory Visit (INDEPENDENT_AMBULATORY_CARE_PROVIDER_SITE_OTHER): Payer: BC Managed Care – PPO | Admitting: Family Medicine

## 2016-02-07 ENCOUNTER — Encounter: Payer: Self-pay | Admitting: Family Medicine

## 2016-02-07 VITALS — BP 106/68 | HR 82 | Resp 18 | Ht 62.0 in | Wt 137.0 lb

## 2016-02-07 DIAGNOSIS — E785 Hyperlipidemia, unspecified: Secondary | ICD-10-CM | POA: Diagnosis not present

## 2016-02-07 DIAGNOSIS — M328 Other forms of systemic lupus erythematosus: Secondary | ICD-10-CM

## 2016-02-07 DIAGNOSIS — R7303 Prediabetes: Secondary | ICD-10-CM

## 2016-02-07 NOTE — Assessment & Plan Note (Signed)
Patient educated about the importance of limiting  Carbohydrate intake , the need to commit to daily physical activity for a minimum of 30 minutes , and to commit weight loss. The fact that changes in all these areas will reduce or eliminate all together the development of diabetes is stressed.   Diabetic Labs Latest Ref Rng & Units 09/02/2015 08/17/2014 01/19/2014 12/12/2012 10/09/2011  HbA1c <5.7 % 6.1(H) 6.7(H) 6.4(H) - -  Chol 125 - 200 mg/dL 165 220(H) 144 156 158  HDL >=46 mg/dL 36(L) 38(L) 29(L) 31(L) 29(L)  Calc LDL <130 mg/dL 101 134(H) 63 65 81  Triglycerides <150 mg/dL 138 242(H) 261(H) 298(H) 239(H)  Creatinine 0.50 - 0.99 mg/dL 1.35(H) 1.20(H) 1.23(H) 1.12(H) 1.10   BP/Weight 02/07/2016 09/07/2015 11/12/2014 04/27/2014 01/06/2014 01/05/2014 [id number]  Systolic BP A999333 A999333 123456 A999333 [id number]  Diastolic BP 68 64 80 70 70 56 80  Wt. (Lbs) 137 131 138 131 131.12 130 131.12  BMI 25.06 23.95 25.23 23.95 23.98 23.77 23.98   No flowsheet data found.

## 2016-02-07 NOTE — Assessment & Plan Note (Signed)
Controlled, rheumatology to continue management as before

## 2016-02-07 NOTE — Assessment & Plan Note (Signed)
Hyperlipidemia:Low fat diet discussed and encouraged.   Lipid Panel  Lab Results  Component Value Date   CHOL 165 09/02/2015   HDL 36 (L) 09/02/2015   LDLCALC 101 09/02/2015   TRIG 138 09/02/2015   CHOLHDL 4.6 09/02/2015

## 2016-02-07 NOTE — Progress Notes (Signed)
   [name]     MRN: [id number]      DOB: Mar 05, 1951   HPI [name] is here for follow up and re-evaluation of chronic medical conditions, medication management and review of any available recent lab and radiology data.  Preventive health is updated, specifically  Cancer screening and Immunization.   Happy with care from rheumatology for her SLE , denies any current joint pain and swelling, no;limitation in mobility  There are no new concerns.  There are no specific complaints   ROS Denies recent fever or chills. Denies sinus pressure, nasal congestion, ear pain or sore throat. Denies chest congestion, productive cough or wheezing. Denies chest pains, palpitations and leg swelling Denies abdominal pain, nausea, vomiting,diarrhea or constipation.   Denies dysuria, frequency, hesitancy or incontinence.  Denies headaches, seizures, numbness, or tingling. Denies depression, anxiety or insomnia. Denies skin break down or rash.   PE  BP 106/68   Pulse 82   Resp 18   Ht 5\' 2"  (1.575 m)   Wt 137 lb (62.1 kg)   SpO2 98%   BMI 25.06 kg/m   Patient alert and oriented and in no cardiopulmonary distress.  HEENT: No facial asymmetry, EOMI,   oropharynx pink and moist.  Neck supple no JVD, no mass.  Chest: Clear to auscultation bilaterally.  CVS: S1, S2 no murmurs, no S3.Regular rate.  ABD: Soft non tender.   Ext: No edema  MS: Adequate ROM spine, shoulders, hips and knees.  Skin: Intact, no ulcerations or rash noted.  Psych: Good eye contact, normal affect. Memory intact not anxious or depressed appearing.  CNS: CN 2-12 intact, power,  normal throughout.no focal deficits noted.   Assessment & Plan  Prediabetes Patient educated about the importance of limiting  Carbohydrate intake , the need to commit to daily physical activity for a minimum of 30 minutes , and to commit weight loss. The fact that changes in all these areas will reduce or eliminate all together the  development of diabetes is stressed.   Diabetic Labs Latest Ref Rng & Units 09/02/2015 08/17/2014 01/19/2014 12/12/2012 10/09/2011  HbA1c <5.7 % 6.1(H) 6.7(H) 6.4(H) - -  Chol 125 - 200 mg/dL 165 220(H) 144 156 158  HDL >=46 mg/dL 36(L) 38(L) 29(L) 31(L) 29(L)  Calc LDL <130 mg/dL 101 134(H) 63 65 81  Triglycerides <150 mg/dL 138 242(H) 261(H) 298(H) 239(H)  Creatinine 0.50 - 0.99 mg/dL 1.35(H) 1.20(H) 1.23(H) 1.12(H) 1.10   BP/Weight 02/07/2016 09/07/2015 11/12/2014 04/27/2014 01/06/2014 01/05/2014 [id number]  Systolic BP A999333 [id number] A999333 [id number]  Diastolic BP 68 64 80 70 70 56 80  Wt. (Lbs) 137 131 138 131 131.12 130 131.12  BMI 25.06 23.95 25.23 23.95 23.98 23.77 23.98   No flowsheet data found.    Dyslipidemia Hyperlipidemia:Low fat diet discussed and encouraged.   Lipid Panel  Lab Results  Component Value Date   CHOL 165 09/02/2015   HDL 36 (L) 09/02/2015   LDLCALC 101 09/02/2015   TRIG 138 09/02/2015   CHOLHDL 4.6 09/02/2015       SLE (systemic lupus erythematosus) Controlled, rheumatology to continue management as before

## 2016-02-07 NOTE — Patient Instructions (Signed)
Annual physical June 14 or after, call if you need me sooner please  hBa1C , chem 7 and EGFGR today  Weight loss goal of 6 pounds till I see see you next year  Please work on good  health habits so that your health will improve. 1. Commitment to daily physical activity for 30 to 60  minutes, if you are able to do this.  2. Commitment to wise food choices. Aim for half of your  food intake to be vegetable and fruit, one quarter starchy foods, and one quarter protein. Try to eat on a regular schedule  3 meals per day, snacking between meals should be limited to vegetables or fruits or small portions of nuts. 64 ounces of water per day is generally recommended, unless you have specific health conditions, like heart failure or kidney failure where you will need to limit fluid intake.  3. Commitment to sufficient and a  good quality of physical and mental rest daily, generally between 6 to 8 hours per day.  WITH PERSISTANCE AND PERSEVERANCE, THE IMPOSSIBLE , BECOMES THE NORM!

## 2016-02-24 LAB — BASIC METABOLIC PANEL WITH GFR
BUN: 17 mg/dL (ref 7–25)
CO2: 27 mmol/L (ref 20–31)
Calcium: 9.4 mg/dL (ref 8.6–10.4)
Chloride: 104 mmol/L (ref 98–110)
Creat: 1.14 mg/dL — ABNORMAL HIGH (ref 0.50–0.99)
GFR, Est African American: 59 mL/min — ABNORMAL LOW (ref >=60)
GFR, Est Non African American: 51 mL/min — ABNORMAL LOW (ref >=60)
Glucose, Bld: 83 mg/dL (ref 65–99)
Potassium: 4.3 mmol/L (ref 3.5–5.3)
Sodium: 139 mmol/L (ref 135–146)

## 2016-02-25 LAB — HEMOGLOBIN A1C
Hgb A1c MFr Bld: 5.9 % — ABNORMAL HIGH (ref <5.7)
Mean Plasma Glucose: 123 mg/dL

## 2016-03-31 ENCOUNTER — Telehealth: Payer: Self-pay | Admitting: Family Medicine

## 2016-03-31 NOTE — Telephone Encounter (Signed)
[name] is c/o her stools possibly being blocked, she states in the last 2 bowel movements shes had it looks like black plastic in it, please advise?

## 2016-04-04 NOTE — Telephone Encounter (Signed)
Called and left message for patient to return call.  

## 2016-04-05 NOTE — Telephone Encounter (Signed)
Spoke with patient and appointment scheduled.  She states that bowels are hard.  She is up to date with colonoscopy and water intake is adequate.  Patient will call back if any further changes before appointment.

## 2016-04-11 ENCOUNTER — Telehealth: Payer: Self-pay

## 2016-04-11 DIAGNOSIS — R195 Other fecal abnormalities: Secondary | ICD-10-CM

## 2016-04-11 NOTE — Telephone Encounter (Signed)
I recommend GI appt in Dr Rourke's office to address these concerns, pls enter I will sign, and also let Dr Sydell Axon know of the situation

## 2016-04-11 NOTE — Addendum Note (Signed)
Addended by: Denman George B on: 04/11/2016 03:18 PM   Modules accepted: Orders

## 2016-04-11 NOTE — Telephone Encounter (Signed)
Patient aware and referral entered   

## 2016-04-17 ENCOUNTER — Encounter: Payer: Self-pay | Admitting: Internal Medicine

## 2016-04-25 ENCOUNTER — Ambulatory Visit (HOSPITAL_COMMUNITY)
Admission: RE | Admit: 2016-04-25 | Discharge: 2016-04-25 | Disposition: A | Discharge status: Home / Self Care | Payer: BC Managed Care – PPO | Source: Ambulatory Visit | Attending: Family Medicine | Admitting: Family Medicine

## 2016-04-25 ENCOUNTER — Encounter: Payer: Self-pay | Admitting: Family Medicine

## 2016-04-25 ENCOUNTER — Ambulatory Visit (INDEPENDENT_AMBULATORY_CARE_PROVIDER_SITE_OTHER): Payer: BC Managed Care – PPO | Admitting: Family Medicine

## 2016-04-25 VITALS — BP 128/80 | HR 74 | Resp 16 | Ht 62.0 in | Wt 141.0 lb

## 2016-04-25 DIAGNOSIS — Z1211 Encounter for screening for malignant neoplasm of colon: Secondary | ICD-10-CM | POA: Diagnosis not present

## 2016-04-25 DIAGNOSIS — R194 Change in bowel habit: Secondary | ICD-10-CM

## 2016-04-25 DIAGNOSIS — E785 Hyperlipidemia, unspecified: Secondary | ICD-10-CM | POA: Diagnosis not present

## 2016-04-25 DIAGNOSIS — D126 Benign neoplasm of colon, unspecified: Secondary | ICD-10-CM

## 2016-04-25 DIAGNOSIS — R198 Other specified symptoms and signs involving the digestive system and abdomen: Secondary | ICD-10-CM

## 2016-04-25 DIAGNOSIS — K5909 Other constipation: Secondary | ICD-10-CM

## 2016-04-25 DIAGNOSIS — M8580 Other specified disorders of bone density and structure, unspecified site: Secondary | ICD-10-CM

## 2016-04-25 DIAGNOSIS — R7303 Prediabetes: Secondary | ICD-10-CM | POA: Diagnosis not present

## 2016-04-25 DIAGNOSIS — R918 Other nonspecific abnormal finding of lung field: Secondary | ICD-10-CM | POA: Diagnosis not present

## 2016-04-25 DIAGNOSIS — M328 Other forms of systemic lupus erythematosus: Secondary | ICD-10-CM

## 2016-04-25 DIAGNOSIS — K5900 Constipation, unspecified: Secondary | ICD-10-CM | POA: Insufficient documentation

## 2016-04-25 LAB — POC HEMOCCULT BLD/STL (OFFICE/1-CARD/DIAGNOSTIC): Fecal Occult Blood, POC: NEGATIVE

## 2016-04-25 NOTE — Patient Instructions (Addendum)
Ppt Physical exam June 15 or after, call if you need me sooner  No intestinal obstruction  Abdominal x ray today  Likely ate foreign object unintentionally, 3 to 4 weeks ago, and fortunately it has passed through with no problem  Fasting cbc, lipid,cmp, TSH, HBA1C, vit D in June  Change to clean green eating as we discussed  Keep appt with GI re concerns about stool caliber and the fact that you had a precancerous polyp removed  Thank you  for choosing [facility]. We consider it a privelige to serve you.  Delivering excellent health care in a caring and  compassionate way is our goal.  Partnering with you,  so that together we can achieve this goal is our strategy.

## 2016-04-30 DIAGNOSIS — R198 Other specified symptoms and signs involving the digestive system and abdomen: Secondary | ICD-10-CM | POA: Insufficient documentation

## 2016-04-30 DIAGNOSIS — D126 Benign neoplasm of colon, unspecified: Secondary | ICD-10-CM | POA: Insufficient documentation

## 2016-04-30 NOTE — Assessment & Plan Note (Signed)
New c/o change in BM with h/o polyp, GI to evaluate

## 2016-04-30 NOTE — Assessment & Plan Note (Signed)
Report of FB in stool and change in stool caliber in past 3 to 4 weeks. Abdominal X ray and rectal exam at visit, heme neg stool. Keep GI appt due for further eval as deemed necessary

## 2016-04-30 NOTE — Progress Notes (Signed)
   [name]     MRN: [id number]      DOB: 06/01/1950   HPI [name] is here with a c/o seeing plastic rubbery object in her stool approx 3 weeks ago, unsure where it came from or what is was, and concerned as to whether there is associated bowel obstruction. Denies abdominal pan, bloating or distension, no nausea, vomit or chills Also c/o recent change in  Stool pattern and caliber, in the past she had a colon polyp removed and wants to be re evaluated by GI  ROS Denies recent fever or chills. Denies sinus pressure, nasal congestion, ear pain or sore throat. Denies chest congestion, productive cough or wheezing. Denies chest pains, palpitations and leg swelling .   Denies dysuria, frequency, hesitancy or incontinence. Denies joint pain, swelling and limitation in mobility. Denies headaches, seizures, numbness, or tingling. Denies depression, anxiety or insomnia. Denies skin break down or rash.   PE  BP 128/80   Pulse 74   Resp 16   Ht 5\' 2"  (1.575 m)   Wt 141 lb (64 kg)   SpO2 97%   BMI 25.79 kg/m   Patient alert and oriented and in no cardiopulmonary distress.  HEENT: No facial asymmetry, EOMI,   oropharynx pink and moist.  Neck supple no JVD, no mass.  Chest: Clear to auscultation bilaterally.  CVS: S1, S2 no murmurs, no S3.Regular rate.  ABD: Soft non tender. Non distended. Normal BS. No organomegaly or mass. Rectal; no mass, heme negative normal stool, npo foreign object on rectal exam  Ext: No edema  MS: Adequate ROM spine, shoulders, hips and knees.  Skin: Intact, no ulcerations or rash noted.  Psych: Good eye contact, normal affect. Memory intact not anxious or depressed appearing.  CNS: CN 2-12 intact, power,  normal throughout.no focal deficits noted.   Assessment & Plan  Change in bowel movement Report of FB in stool and change in stool caliber in past 3 to 4 weeks. Abdominal X ray and rectal exam at visit, heme neg stool. Keep GI appt due  for further eval as deemed necessary  Tubular adenoma of colon New c/o change in BM with h/o polyp, GI to evaluate

## 2016-05-10 ENCOUNTER — Ambulatory Visit (INDEPENDENT_AMBULATORY_CARE_PROVIDER_SITE_OTHER): Payer: BC Managed Care – PPO | Admitting: Nurse Practitioner

## 2016-05-10 ENCOUNTER — Encounter: Payer: Self-pay | Admitting: Nurse Practitioner

## 2016-05-10 VITALS — BP 137/81 | HR 76 | Temp 98.0°F | Ht 62.0 in | Wt 141.0 lb

## 2016-05-10 DIAGNOSIS — K5904 Chronic idiopathic constipation: Secondary | ICD-10-CM

## 2016-05-10 NOTE — Progress Notes (Signed)
Referring Provider: [name], MD Primary Care Physician:  Tula Nakayama, MD Primary GI:  Dr. Gala Romney  Chief Complaint  Patient presents with  . Constipation    HPI:   [name] is a 66 y.o. female who presents for new onset constipation. The patient is never been seen in our office but underwent an average risk screening colonoscopy on 01/05/2014 which found pancolonic diverticulosis, a single 3 mm polyp in the ascending colon. Surgical pathology as per below. Recommended 7 year repeat colonoscopy. The patient is on recall for 2022.  Today she states she's doing well overall. Notes chronic constipation but has been worse for about the past year. She has tried "smooth move" stool softener, increased dietary fiber (which helped a little), increased water intake. Overall with minimal improvement. Is having a bowel movement typically once a day but consistent with Bristol 1. Has gone as often as 3 days without a bowel movement. Occasional straining. Denies abdominal pain, N/V, hematochezia, melena, unintentional weight loss, fever, chills. Denies chest pain, dyspnea, dizziness, lightheadedness, syncope, near syncope. Denies any other upper or lower GI symptoms.     Past Medical History:  Diagnosis Date  . Arthritis 02/13/10   abnormal MRI  . SLE (systemic lupus erythematosus) ([place]) dx apprx 1990   reports immunotherapy, cytoxan and steroids at [facility], no f/u for over 37yrs    Past Surgical History:  Procedure Laterality Date  . ABDOMINAL HYSTERECTOMY  unsure, over 10 yrs   partial, has had abnormal pap  . BLADDER REPAIR    . COLONOSCOPY N/A 01/05/2014   Procedure: COLONOSCOPY;  Surgeon: Daneil Dolin, MD;  Location: [facility];  Service: Endoscopy;  Laterality: N/A;  12:15 PM    Current Outpatient Prescriptions  Medication Sig Dispense Refill  . aspirin 81 MG tablet Take 81 mg by mouth daily.    . calcium gluconate 500 MG tablet Take 1 tablet by mouth daily.     . cyanocobalamin 1000 MCG tablet Take 1,000 mcg by mouth daily.    . hydroxychloroquine (PLAQUENIL) 200 MG tablet Take 400 mg by mouth daily.    . Multiple Vitamin (MULTIVITAMIN) capsule Take 1 capsule by mouth daily.    . vitamin E 400 UNIT capsule Take 400 Units by mouth daily.     No current facility-administered medications for this visit.     Allergies as of 05/10/2016 - Review Complete 05/10/2016  Allergen Reaction Noted  . Sulfonamide derivatives  03/13/2007    Family History  Problem Relation Age of Onset  . Heart disease Father   . Cancer Brother 37    breast  . Cancer Sister 45    breast  . Colon cancer Neg Hx     Social History   Social History  . Marital status: Married    Spouse name: N/A  . Number of children: N/A  . Years of education: N/A   Social History Main Topics  . Smoking status: Never Smoker  . Smokeless tobacco: Never Used  . Alcohol use No  . Drug use: No  . Sexual activity: Not Asked   Other Topics Concern  . None   Social History Narrative  . None    Review of Systems: General: Negative for anorexia, weight loss, fever, chills, fatigue, weakness. ENT: Negative for hoarseness, difficulty swallowing CV: Negative for chest pain, angina, palpitations, peripheral edema.  Respiratory: Negative for dyspnea at rest, cough, sputum, wheezing.  GI: See history of present illness. MS:  Negative for chronic pain.  Derm: Negative for rash or itching.  Endo: Negative for unusual weight change.  Heme: Negative for bruising or bleeding. Allergy: Negative for rash or hives.   Physical Exam: BP 137/81   Pulse 76   Temp 98 F (36.7 C) (Oral)   Ht 5\' 2"  (1.575 m)   Wt 141 lb (64 kg)   BMI 25.79 kg/m  General:   Alert and oriented. Pleasant and cooperative. Well-nourished and well-developed.  Head:  Normocephalic and atraumatic. Eyes:  Without icterus, sclera clear and conjunctiva pink.  Ears:  Normal auditory acuity. Cardiovascular:  S1,  S2 present without murmurs appreciated. Extremities without clubbing or edema. Respiratory:  Clear to auscultation bilaterally. No wheezes, rales, or rhonchi. No distress.  Gastrointestinal:  +BS, soft, non-tender and non-distended. No HSM noted. No guarding or rebound. No masses appreciated.  Rectal:  Deferred  Musculoskalatal:  Symmetrical without gross deformities. Neurologic:  Alert and oriented x4;  grossly normal neurologically. Psych:  Alert and cooperative. Normal mood and affect. Heme/Lymph/Immune: No excessive bruising noted.    05/10/2016 8:51 AM   Disclaimer: This note was dictated with voice recognition software. Similar sounding words can inadvertently be transcribed and may not be corrected upon review.

## 2016-05-10 NOTE — Assessment & Plan Note (Addendum)
The patient describes chronic constipation for most of her life. This is gotten worse over the past year. She is not on any pain medicines. She denies anything more than rare abdominal pain. Does not fit the picture of IBS-C or OIC. Likely chronic idiopathic constipation. She is tried increased fiber, increased water, stool softeners with minimal relief. Bristol 1 stools, occasional straining. Bowel movement once a day to every 3 days. No red flag/warning signs or symptoms. Colonoscopy up-to-date next due in 2022. I will start her on Linzess 72 g a day. We will provide samples to last 2 weeks and a co-pay card. She is to call us in 1-2 weeks with a progress report. I have cautioned her about the possibility of an initial loose stools which typically resolve in 3-5 days. Return for follow-up in 2 months.

## 2016-05-10 NOTE — Patient Instructions (Signed)
1. Start taking Linzess 72 g once a day. Take this on an empty stomach. 2. I am giving you samples to last for about a week. 3. Call us in about a week and let us know how the medicine is working. 4. I am also providing a co-pay card so that if the medicine works and we need to send in a prescription to reduce your co-pay. 5. Call with any worsening symptoms. 6. Return for follow-up in 2 weeks.

## 2016-05-10 NOTE — Progress Notes (Signed)
cc'ed to pcp °

## 2016-05-18 ENCOUNTER — Telehealth: Payer: Self-pay | Admitting: Internal Medicine

## 2016-05-18 MED ORDER — LINACLOTIDE 145 MCG PO CAPS: 145.0000 ug | ORAL_CAPSULE | Freq: Every day | ORAL | 0 refills | Status: DC

## 2016-05-18 NOTE — Addendum Note (Signed)
Addended by: Gordy Levan, ERIC A on: 05/18/2016 04:39 PM   Modules accepted: Orders

## 2016-05-18 NOTE — Telephone Encounter (Signed)
Routing to EG 

## 2016-05-18 NOTE — Telephone Encounter (Signed)
PATIENT CALLED AND STATED THAT LINZESS SAMPLES 72 MCG CAPSULES DID NOT WORK   [phone]    IS THERE ANYTHING ELSE SHE CAN TRY

## 2016-05-18 NOTE — Telephone Encounter (Signed)
Lets give her samples of 145 mcg dose and see if that works any better. Have her call in about a week with a progress report. She can pick up samples from our office.

## 2016-05-22 NOTE — Telephone Encounter (Signed)
Tried to call pt- NA-LMOM. I have also sent her a message via mychart. Samples are at the front desk.

## 2016-05-23 NOTE — Telephone Encounter (Signed)
Pt responded in mychart. She is going to pick up samples today.

## 2016-06-01 ENCOUNTER — Ambulatory Visit: Payer: BC Managed Care – PPO | Admitting: Nurse Practitioner

## 2016-07-03 ENCOUNTER — Other Ambulatory Visit: Payer: Self-pay | Admitting: Family Medicine

## 2016-07-03 ENCOUNTER — Telehealth: Payer: Self-pay

## 2016-07-03 DIAGNOSIS — M329 Systemic lupus erythematosus, unspecified: Secondary | ICD-10-CM

## 2016-07-03 NOTE — Telephone Encounter (Signed)
Referral done

## 2016-07-03 NOTE — Telephone Encounter (Signed)
Needs a new referral to dr deveshaur because dr [name] has retired

## 2016-07-10 ENCOUNTER — Telehealth: Payer: Self-pay | Admitting: Family Medicine

## 2016-07-10 DIAGNOSIS — M328 Other forms of systemic lupus erythematosus: Secondary | ICD-10-CM

## 2016-07-10 NOTE — Telephone Encounter (Signed)
Referral entered  

## 2016-07-10 NOTE — Telephone Encounter (Signed)
Patients rheumatologist Dr.Truslow is retiring. Her records from his office are in the patients possession  However the new rheumatologist she is looking into is Dr.Deveshwar and she requires a referral from PCP. Home [phone] Cell [phone] Let me know if I can do anything further

## 2016-09-15 DIAGNOSIS — Z79899 Other long term (current) drug therapy: Secondary | ICD-10-CM | POA: Insufficient documentation

## 2016-09-15 DIAGNOSIS — Z87448 Personal history of other diseases of urinary system: Secondary | ICD-10-CM | POA: Insufficient documentation

## 2016-09-15 DIAGNOSIS — L309 Dermatitis, unspecified: Secondary | ICD-10-CM | POA: Insufficient documentation

## 2016-09-15 DIAGNOSIS — M3214 Glomerular disease in systemic lupus erythematosus: Secondary | ICD-10-CM | POA: Insufficient documentation

## 2016-09-15 DIAGNOSIS — Z862 Personal history of diseases of the blood and blood-forming organs and certain disorders involving the immune mechanism: Secondary | ICD-10-CM | POA: Insufficient documentation

## 2016-09-15 DIAGNOSIS — Z8639 Personal history of other endocrine, nutritional and metabolic disease: Secondary | ICD-10-CM | POA: Insufficient documentation

## 2016-09-15 DIAGNOSIS — L659 Nonscarring hair loss, unspecified: Secondary | ICD-10-CM | POA: Insufficient documentation

## 2016-09-15 DIAGNOSIS — M5136 Other intervertebral disc degeneration, lumbar region: Secondary | ICD-10-CM | POA: Insufficient documentation

## 2016-09-15 NOTE — Progress Notes (Signed)
Office Visit Note  Patient: [name]             Date of Birth: 07-Jun-1950           MRN: [id number]             PCP: [name], MD Referring: [name], MD Visit Date: 09/19/2016 Occupation: @GUAROCC @    Subjective:  Lower back pain and lupus.   History of Present Illness: [name] is a 66 y.o. female with history of systemic lupus erythematosus. According to patient her symptoms started in 1999 with severe pedal edema. At the time she was diagnosed with lupus by her primary care physician. She was referred to a rheumatologist and reads well who did further workup and referred her to [facility] nephrology. She was on prednisone for about a year and Cytoxan IV total for doses were given. She states after that she went into remission and was taken off the prednisone. Her rheumatologist from [place] left. She had no follow-up with any rheumatologist and did well for several years. She states 2 years ago she started having increased joint pain and swelling and stiffness hair loss and developed a rash on her face. At the time she was seen by Dr. [name] who did some x-rays and lab work and started her on prednisone taper and Plaquenil. She states she's been off prednisone for a long time and had been doing well on Plaquenil. She was also seen by dermatologist Dr. [name] who gave some topical treatment for her rash on her face. 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 She's been seeing a chiropractor for that. She denies any joint pain or joint swelling currently.  Activities of Daily Living:  Patient reports morning stiffness for 0 minute.   Patient Denies nocturnal pain.  Difficulty dressing/grooming: Denies Difficulty climbing stairs: Denies Difficulty getting out of chair: Denies Difficulty using hands for taps, buttons, cutlery, and/or writing: Denies   Review of Systems  Constitutional: Negative for fatigue, night sweats, weight gain,  weight loss and weakness.  HENT: Negative for mouth sores, trouble swallowing, trouble swallowing, mouth dryness and nose dryness.   Eyes: Negative for pain, redness, visual disturbance and dryness.  Respiratory: Negative for cough, shortness of breath and difficulty breathing.   Cardiovascular: Negative for chest pain, palpitations, hypertension, irregular heartbeat and swelling in legs/feet.  Gastrointestinal: Negative for blood in stool, constipation and diarrhea.  Endocrine: Negative for increased urination.  Genitourinary: Negative for vaginal dryness.  Musculoskeletal: Negative for arthralgias, joint pain, joint swelling, myalgias, muscle weakness, morning stiffness, muscle tenderness and myalgias.  Skin: Negative for color change, rash, hair loss, skin tightness, ulcers and sensitivity to sunlight.  Allergic/Immunologic: Negative for susceptible to infections.  Neurological: Negative for dizziness, memory loss and night sweats.  Hematological: Negative for swollen glands.  Psychiatric/Behavioral: Negative for depressed mood and sleep disturbance. The patient is not nervous/anxious.     PMFS History:  Patient Active Problem List   Diagnosis Date Noted  . Lupus nephritis ([place]) 1999-2000  09/15/2016  . High risk medication use 09/15/2016  . DDD (degenerative disc disease), lumbar 09/15/2016  . History of hyperlipidemia 09/15/2016  . History of anemia 09/15/2016  . History of proteinuria  09/15/2016  . Dermatitis of face 09/15/2016  . Hair loss 09/15/2016  . Change in bowel movement 04/30/2016  . Tubular adenoma of colon 04/30/2016  . Constipation 04/25/2016  . Prediabetes 11/15/2014  . Osteopenia 07/21/2013  .  Anemia 12/22/2012  . SLE (systemic lupus erythematosus) ([place]) 02/15/2012  . Dyslipidemia 10/01/2007  . History of diverticulosis 10/01/2007    Past Medical History:  Diagnosis Date  . Arthritis 02/13/10   abnormal MRI  . SLE (systemic lupus erythematosus) ([place]) dx  apprx 1990   reports immunotherapy, cytoxan and steroids at [facility], no f/u for over 10yrs    Family History  Problem Relation Age of Onset  . Heart disease Father   . Cancer Brother 2       breast  . Cancer Sister 75       breast  . Colon cancer Neg Hx    Past Surgical History:  Procedure Laterality Date  . ABDOMINAL HYSTERECTOMY  unsure, over 10 yrs   partial, has had abnormal pap  . BLADDER REPAIR    . COLONOSCOPY N/A 01/05/2014   Procedure: COLONOSCOPY;  Surgeon: Daneil Dolin, MD;  Location: [facility];  Service: Endoscopy;  Laterality: N/A;  12:15 PM   Social History   Social History Narrative  . No narrative on file     Objective: Vital Signs: BP 126/74   Pulse 73   Resp 14   Ht 5\' 2"  (1.575 m)   Wt 140 lb (63.5 kg)   BMI 25.61 kg/m    Physical Exam  Constitutional: She is oriented to person, place, and time. She appears well-developed and well-nourished.  HENT:  Head: Normocephalic and atraumatic.  Eyes: Conjunctivae and EOM are normal.  Neck: Normal range of motion.  Cardiovascular: Normal rate, regular rhythm, normal heart sounds and intact distal pulses.   Pulmonary/Chest: Effort normal and breath sounds normal.  Abdominal: Soft. Bowel sounds are normal.  Lymphadenopathy:    She has no cervical adenopathy.  Neurological: She is alert and oriented to person, place, and time.  Skin: Skin is warm and dry. Capillary refill takes less than 2 seconds.  Psychiatric: She has a normal mood and affect. Her behavior is normal.  Nursing note and vitals reviewed.    Musculoskeletal Exam: C-spine and thoracic lumbar spine good range of motion. Shoulder joints elbow joints wrist joint MCPs PIPs DIPs with good range of motion. No synovitis was noted. Hip joints knee joints ankles MTPs PIPs DIPs with good range of motion with no synovitis.  CDAI Exam: CDAI Homunculus Exam:   Joint Counts:  CDAI Tender Joint count: 0 CDAI Swollen Joint count: 0  Global  Assessments:  Patient Global Assessment: 0 Provider Global Assessment: 0    Investigation: Findings:   09/02/15 TSH normal 2.49   06/16/2014 RF negative, CCP Negative, ANA + 1:160 homogenous pattern, and Sed Rate elevated 42  03/17/2015 dsDNA + 64, Smith + 5.5, RNP+ 6.5, SSA(Ro) Negative, SSB (La) Negative, Sed rate Normal 19  Normal plaquenil Eye exam 09/10/15   CBC Latest Ref Rng & Units 09/18/2016 09/02/2015 01/19/2014  WBC 3.8 - 10.8 K/uL 3.6(L) 3.6(L) 4.2  Hemoglobin 11.7 - 15.5 g/dL 12.5 11.6(L) 11.7(L)  Hematocrit 35.0 - 45.0 % 39.0 35.6 35.6(L)  Platelets 140 - 400 K/uL 164 161 183    CMP Latest Ref Rng & Units 09/18/2016 02/24/2016 09/02/2015  Glucose 65 - 99 mg/dL 88 83 82  BUN 7 - 25 mg/dL 20 17 21   Creatinine 0.50 - 0.99 mg/dL 1.33(H) 1.14(H) 1.35(H)  Sodium 135 - 146 mmol/L 141 139 139  Potassium 3.5 - 5.3 mmol/L 4.2 4.3 4.1  Chloride 98 - 110 mmol/L 107 104 106  CO2 20 - 31 mmol/L 24  27 23  Calcium 8.6 - 10.4 mg/dL 9.6 9.4 9.3  Total Protein 6.1 - 8.1 g/dL 7.0 - 7.1  Total Bilirubin 0.2 - 1.2 mg/dL 0.4 - 0.4  Alkaline Phos 33 - 130 U/L 43 - 33  AST 10 - 35 U/L 19 - 25  ALT 6 - 29 U/L 13 - 16   09/21/2016 hemoglobin A1c 6.2, TSH normal vitamin D 31 CBC WBC 3.6 CMP creatinine 1.33, LDL 112  Imaging: Xr Foot 2 Views Left  Result Date: 09/19/2016 Minimal first MTP narrowing was noted. No significant PIP/DIP narrowing was noted. No erosive changes were noted. Impression: These findings were consistent with mild osteoarthritis of the foot  Xr Foot 2 Views Right  Result Date: 09/19/2016 Minimal first MTP narrowing was noted. No significant PIP/DIP narrowing was noted. No erosive changes were noted. Impression: These findings were consistent with mild osteoarthritis of the foot  Xr Hand 2 View Left  Result Date: 09/19/2016 Minimal PIP joint narrowing was noted. No MCP joint narrowing or erosive changes were noted. No intercarpal joint space narrowing was noted.  Impression: These findings were consistent with mild osteoarthritis .  Xr Hand 2 View Right  Result Date: 09/19/2016 Minimal PIP joint narrowing was noted. No MCP joint narrowing or erosive changes were noted. No intercarpal joint space narrowing was noted. Impression: These findings were consistent with mild osteoarthritis .   Speciality Comments: No specialty comments available.    Procedures:  No procedures performed Allergies: Sulfonamide derivatives   Assessment / Plan:     Visit Diagnoses: Systemic lupus erythematosus  dsDNA +64, Smith + 5.5, RNP+ 6.5, SSA(Ro) Negative, SSB (La) Negative, patient presented initially with nephritis, hair loss, arthralgias, dermatitis  -patient has complicated history of lupus and lupus nephritis although she's been in remission for a while until her symptoms recurred 2 years ago. She is doing quite well today. She has occasional stiffness in her hands and feet. I'll obtain following labs and x-rays today. Her most recent labs showed creatinine of 1.33. Plan: Urinalysis, Routine w reflex microscopic, Sedimentation rate, CK, ANA, C3 and C4, [id number] ENA PANEL, Beta-2 glycoprotein antibodies, Cardiolipin antibodies, IgG, IgM, IgA, Lupus anticoagulant panel  Lupus nephritis ([place]) 1999-2000 treated with Cytoxan IV for total for dosage and prednisone for one year. She's not been followed by nephrologist.  History of renal insufficiency GFR 40-50  Creat 1.15-1.35 range   History of proteinuria : I'll check her UA today  Dermatitis of face and scalp : Resolved after using topical agents  High risk medication use - Plaquenil 200mg  po bid, eye exam 09/11/16 Dr. Jorja Loa - Plan: Serum protein electrophoresis with reflex  DDD (degenerative disc disease), lumbar: She also has sciatica and has been followed by chiropractor.  Osteopenia of multiple sites: She is on calcium. I've advised her to take vitamin D 1000 units every day. I would like her to maintain her  vitamin D between 40 and 50.  Her other medical problems are listed as follows:  History of hyperlipidemia  History of anemia  History of prediabetes  History of adenomatous polyp of colon  History of diverticulosis  Pain in both hands - Plan: XR Hand 2 View Right, XR Hand 2 View Left  Pain in both feet - Plan: XR Foot 2 Views Right, XR Foot 2 Views Left    Orders: Orders Placed This Encounter  Procedures  . XR Hand 2 View Right  . XR Hand 2 View Left  . XR Foot 2  Views Right  . XR Foot 2 Views Left  . Urinalysis, Routine w reflex microscopic  . Sedimentation rate  . CK  . ANA  . C3 and C4  . [id number] ENA PANEL  . Beta-2 glycoprotein antibodies  . Cardiolipin antibodies, IgG, IgM, IgA  . Lupus anticoagulant panel  . Serum protein electrophoresis with reflex  . CBC with Differential/Platelet  . COMPLETE METABOLIC PANEL WITH GFR  . VITAMIN D 25 Hydroxy (Vit-D Deficiency, Fractures)  . Urinalysis, Routine w reflex microscopic   No orders of the defined types were placed in this encounter.   Face-to-face time spent with patient was 50 minutes. 50% of time was spent in counseling and coordination of care.  Follow-Up Instructions: Return in about 3 months (around 12/20/2016) for Systemic lupus.   Bo Merino, MD  Note - This record has been created using Editor, commissioning.  Chart creation errors have been sought, but may not always  have been located. Such creation errors do not reflect on  the standard of medical care.

## 2016-09-18 LAB — CBC
HCT: 39 % (ref 35.0–45.0)
Hemoglobin: 12.5 g/dL (ref 11.7–15.5)
MCH: 27.3 pg (ref 27.0–33.0)
MCHC: 32.1 g/dL (ref 32.0–36.0)
MCV: 85.2 fL (ref 80.0–100.0)
MPV: 11.8 fL (ref 7.5–12.5)
Platelets: 164 10*3/uL (ref 140–400)
RBC: 4.58 MIL/uL (ref 3.80–5.10)
RDW: 14.1 % (ref 11.0–15.0)
WBC: 3.6 10*3/uL — ABNORMAL LOW (ref 3.8–10.8)

## 2016-09-18 LAB — COMPREHENSIVE METABOLIC PANEL
ALT: 13 U/L (ref 6–29)
AST: 19 U/L (ref 10–35)
Albumin: 3.8 g/dL (ref 3.6–5.1)
Alkaline Phosphatase: 43 U/L (ref 33–130)
BUN: 20 mg/dL (ref 7–25)
CO2: 24 mmol/L (ref 20–31)
Calcium: 9.6 mg/dL (ref 8.6–10.4)
Chloride: 107 mmol/L (ref 98–110)
Creat: 1.33 mg/dL — ABNORMAL HIGH (ref 0.50–0.99)
Glucose, Bld: 88 mg/dL (ref 65–99)
Potassium: 4.2 mmol/L (ref 3.5–5.3)
Sodium: 141 mmol/L (ref 135–146)
Total Bilirubin: 0.4 mg/dL (ref 0.2–1.2)
Total Protein: 7 g/dL (ref 6.1–8.1)

## 2016-09-18 LAB — LIPID PANEL
Cholesterol: 180 mg/dL (ref <200)
HDL: 30 mg/dL — ABNORMAL LOW (ref >50)
LDL Cholesterol: 112 mg/dL — ABNORMAL HIGH (ref <100)
Total CHOL/HDL Ratio: 6 Ratio — ABNORMAL HIGH (ref <5.0)
Triglycerides: 188 mg/dL — ABNORMAL HIGH (ref <150)
VLDL: 38 mg/dL — ABNORMAL HIGH (ref <30)

## 2016-09-18 LAB — TSH: TSH: 2.35 mIU/L

## 2016-09-19 ENCOUNTER — Ambulatory Visit (INDEPENDENT_AMBULATORY_CARE_PROVIDER_SITE_OTHER): Payer: Self-pay

## 2016-09-19 ENCOUNTER — Encounter: Payer: Self-pay | Admitting: Rheumatology

## 2016-09-19 ENCOUNTER — Ambulatory Visit (INDEPENDENT_AMBULATORY_CARE_PROVIDER_SITE_OTHER): Payer: BC Managed Care – PPO | Admitting: Rheumatology

## 2016-09-19 VITALS — BP 126/74 | HR 73 | Resp 14 | Ht 62.0 in | Wt 140.0 lb

## 2016-09-19 DIAGNOSIS — Z86010 Personal history of colonic polyps: Secondary | ICD-10-CM

## 2016-09-19 DIAGNOSIS — M8589 Other specified disorders of bone density and structure, multiple sites: Secondary | ICD-10-CM

## 2016-09-19 DIAGNOSIS — Z87898 Personal history of other specified conditions: Secondary | ICD-10-CM

## 2016-09-19 DIAGNOSIS — M79671 Pain in right foot: Secondary | ICD-10-CM

## 2016-09-19 DIAGNOSIS — L309 Dermatitis, unspecified: Secondary | ICD-10-CM | POA: Diagnosis not present

## 2016-09-19 DIAGNOSIS — M3214 Glomerular disease in systemic lupus erythematosus: Secondary | ICD-10-CM

## 2016-09-19 DIAGNOSIS — M79641 Pain in right hand: Secondary | ICD-10-CM | POA: Diagnosis not present

## 2016-09-19 DIAGNOSIS — M5136 Other intervertebral disc degeneration, lumbar region: Secondary | ICD-10-CM | POA: Diagnosis not present

## 2016-09-19 DIAGNOSIS — Z8639 Personal history of other endocrine, nutritional and metabolic disease: Secondary | ICD-10-CM | POA: Diagnosis not present

## 2016-09-19 DIAGNOSIS — M3219 Other organ or system involvement in systemic lupus erythematosus: Secondary | ICD-10-CM

## 2016-09-19 DIAGNOSIS — Z862 Personal history of diseases of the blood and blood-forming organs and certain disorders involving the immune mechanism: Secondary | ICD-10-CM | POA: Diagnosis not present

## 2016-09-19 DIAGNOSIS — M79642 Pain in left hand: Secondary | ICD-10-CM

## 2016-09-19 DIAGNOSIS — Z87448 Personal history of other diseases of urinary system: Secondary | ICD-10-CM | POA: Diagnosis not present

## 2016-09-19 DIAGNOSIS — L659 Nonscarring hair loss, unspecified: Secondary | ICD-10-CM

## 2016-09-19 DIAGNOSIS — Z8719 Personal history of other diseases of the digestive system: Secondary | ICD-10-CM

## 2016-09-19 DIAGNOSIS — Z79899 Other long term (current) drug therapy: Secondary | ICD-10-CM

## 2016-09-19 DIAGNOSIS — M79672 Pain in left foot: Secondary | ICD-10-CM

## 2016-09-19 LAB — HEMOGLOBIN A1C
Hgb A1c MFr Bld: 6.2 % — ABNORMAL HIGH (ref <5.7)
Mean Plasma Glucose: 131 mg/dL

## 2016-09-19 LAB — VITAMIN D 25 HYDROXY (VIT D DEFICIENCY, FRACTURES): Vit D, 25-Hydroxy: 31 ng/mL (ref 30–100)

## 2016-09-19 NOTE — Patient Instructions (Addendum)
Standing Labs We placed an order today for your standing lab work.    Please come back and get your standing labs in September 2018 and every 3 months.  We have open lab Monday through Friday from 8:30-11:30 AM and 1:30-4 PM at the office of Dr. Tresa Moore, PA.   The office is located at [address], [place], [place], [place] [address] No appointment is necessary.   Labs are drawn by [facility].  You may receive a bill from [facility] for your lab work. If you have any questions regarding directions or hours of operation,  please call [phone].    Hydroxychloroquine tablets What is this medicine? HYDROXYCHLOROQUINE (hye drox ee KLOR oh kwin) is used to treat rheumatoid arthritis and systemic lupus erythematosus. It is also used to treat malaria. This medicine may be used for other purposes; ask your health care provider or pharmacist if you have questions. COMMON BRAND NAME(S): Plaquenil, Quineprox What should I tell my health care provider before I take this medicine? They need to know if you have any of these conditions: -diabetes -eye disease, vision problems -G6PD deficiency -history of blood diseases -history of irregular heartbeat -if you often drink alcohol -kidney disease -liver disease -porphyria -psoriasis -seizures -an unusual or allergic reaction to chloroquine, hydroxychloroquine, other medicines, foods, dyes, or preservatives -pregnant or trying to get pregnant -breast-feeding How should I use this medicine? Take this medicine by mouth with a glass of water. Follow the directions on the prescription label. Avoid taking antacids within 4 hours of taking this medicine. It is best to separate these medicines by at least 4 hours. Do not cut, crush or chew this medicine. You can take it with or without food. If it upsets your stomach, take it with food. Take your medicine at regular intervals. Do not take your medicine more often than directed. Take  all of your medicine as directed even if you think you are better. Do not skip doses or stop your medicine early. Talk to your pediatrician regarding the use of this medicine in children. While this drug may be prescribed for selected conditions, precautions do apply. Overdosage: If you think you have taken too much of this medicine contact a poison control center or emergency room at once. NOTE: This medicine is only for you. Do not share this medicine with others. What if I miss a dose? If you miss a dose, take it as soon as you can. If it is almost time for your next dose, take only that dose. Do not take double or extra doses. What may interact with this medicine? Do not take this medicine with any of the following medications: -cisapride -dofetilide -dronedarone -live virus vaccines -penicillamine -pimozide -thioridazine -ziprasidone This medicine may also interact with the following medications: -ampicillin -antacids -cimetidine -cyclosporine -digoxin -medicines for diabetes, like insulin, glipizide, glyburide -medicines for seizures like carbamazepine, phenobarbital, phenytoin -mefloquine -methotrexate -other medicines that prolong the QT interval (cause an abnormal heart rhythm) -praziquantel This list may not describe all possible interactions. Give your health care provider a list of all the medicines, herbs, non-prescription drugs, or dietary supplements you use. Also tell them if you smoke, drink alcohol, or use illegal drugs. Some items may interact with your medicine. What should I watch for while using this medicine? Tell your doctor or healthcare professional if your symptoms do not start to get better or if they get worse. Avoid taking antacids within 4 hours of taking this medicine. It is best  to separate these medicines by at least 4 hours. Tell your doctor or health care professional right away if you have any change in your eyesight. Your vision and blood may be  tested before and during use of this medicine. This medicine can make you more sensitive to the sun. Keep out of the sun. If you cannot avoid being in the sun, wear protective clothing and use sunscreen. Do not use sun lamps or tanning beds/booths. What side effects may I notice from receiving this medicine? Side effects that you should report to your doctor or health care professional as soon as possible: -allergic reactions like skin rash, itching or hives, swelling of the face, lips, or tongue -changes in vision -decreased hearing or ringing of the ears -redness, blistering, peeling or loosening of the skin, including inside the mouth -seizures -sensitivity to light -signs and symptoms of a dangerous change in heartbeat or heart rhythm like chest pain; dizziness; fast or irregular heartbeat; palpitations; feeling faint or lightheaded, falls; breathing problems -signs and symptoms of liver injury like dark yellow or brown urine; general ill feeling or flu-like symptoms; light-colored stools; loss of appetite; nausea; right upper belly pain; unusually weak or tired; yellowing of the eyes or skin -signs and symptoms of low blood sugar such as feeling anxious; confusion; dizziness; increased hunger; unusually weak or tired; sweating; shakiness; cold; irritable; headache; blurred vision; fast heartbeat; loss of consciousness -uncontrollable head, mouth, neck, arm, or leg movements Side effects that usually do not require medical attention (report to your doctor or health care professional if they continue or are bothersome): -anxious -diarrhea -dizziness -hair loss -headache -irritable -loss of appetite -nausea, vomiting -stomach pain This list may not describe all possible side effects. Call your doctor for medical advice about side effects. You may report side effects to FDA at 1-800-FDA-1088. Where should I keep my medicine? Keep out of the reach of children. In children, this medicine can  cause overdose with small doses. Store at room temperature between 15 and 30 degrees C (59 and 86 degrees F). Protect from moisture and light. Throw away any unused medicine after the expiration date. NOTE: This sheet is a summary. It may not cover all possible information. If you have questions about this medicine, talk to your doctor, pharmacist, or health care provider.  2018 Elsevier/Gold Standard (2015-10-27 14:16:15)

## 2016-09-19 NOTE — Progress Notes (Signed)
Pharmacy Note  Subjective: Patient presents today to the [facility] to see Dr. Estanislado Pandy.  Patient is currently taking hydroxychloroquine 200 mg by mouth twice daily prescribed by previous rheumatologist.  Patient seen by the pharmacist for counseling on hydroxychloroquine.    Objective: CMP Latest Ref Rng & Units 09/18/2016 02/24/2016 09/02/2015  Glucose 65 - 99 mg/dL 88 83 82  BUN 7 - 25 mg/dL 20 17 21   Creatinine 0.50 - 0.99 mg/dL 1.33(H) 1.14(H) 1.35(H)  Sodium 135 - 146 mmol/L 141 139 139  Potassium 3.5 - 5.3 mmol/L 4.2 4.3 4.1  Chloride 98 - 110 mmol/L 107 104 106  CO2 20 - 31 mmol/L 24 27 23   Calcium 8.6 - 10.4 mg/dL 9.6 9.4 9.3  Total Protein 6.1 - 8.1 g/dL 7.0 - 7.1  Total Bilirubin 0.2 - 1.2 mg/dL 0.4 - 0.4  Alkaline Phos 33 - 130 U/L 43 - 33  AST 10 - 35 U/L 19 - 25  ALT 6 - 29 U/L 13 - 16   CBC    Component Value Date/Time   WBC 3.6 (L) 09/18/2016 0705   RBC 4.58 09/18/2016 0705   HGB 12.5 09/18/2016 0705   HCT 39.0 09/18/2016 0705   PLT 164 09/18/2016 0705   MCV 85.2 09/18/2016 0705   MCH 27.3 09/18/2016 0705   MCHC 32.1 09/18/2016 0705   RDW 14.1 09/18/2016 0705   LYMPHSABS 1,296 09/02/2015 0718   MONOABS 432 09/02/2015 0718   EOSABS 108 09/02/2015 0718   BASOSABS 36 09/02/2015 0718   Assessment/Plan: Patient was counseled on the purpose, proper use, and adverse effects of hydroxychloroquine including nausea/diarrhea, skin rash, headaches, and sun sensitivity. Patient confirms she has been taking hydroxychloroquine without any adverse effects.  Discussed importance of annual eye exams while on hydroxychloroquine to monitor to ocular toxicity.  Patient confirms she just had eye exam on 09/11/16.  She will take our eye exam form and have them fax the results to our office.  I also discussed importance of frequent laboratory monitoring.  Provided patient with standing lab instructions.  Provided patient with educational materials on hydroxychloroquine and  answered all questions.  Patient consented to hydroxychloroquine.  Will upload consent in the media tab.    Discussed hydroxychloroquine dose with Dr. Estanislado Pandy.  She would like for patient to continue current dose at this time.  Elisabeth Most, Pharm.D., BCPS Clinical Pharmacist Pager: [phone] Phone: [phone] 09/19/2016 9:34 AM

## 2016-09-20 LAB — URINALYSIS, ROUTINE W REFLEX MICROSCOPIC
Bilirubin Urine: NEGATIVE
Glucose, UA: NEGATIVE
Hgb urine dipstick: NEGATIVE
Ketones, ur: NEGATIVE
Nitrite: NEGATIVE
Protein, ur: NEGATIVE
Specific Gravity, Urine: 1.01 (ref 1.001–1.035)
pH: 6 (ref 5.0–8.0)

## 2016-09-20 LAB — ANTI-NUCLEAR AB-TITER (ANA TITER): ANA Titer 1: 1:40 {titer} — ABNORMAL HIGH

## 2016-09-20 LAB — CP5000020 ENA PANEL
ENA SM Ab Ser-aCnc: <1
Ribonucleic Protein(ENA) Antibody, IgG: 6 — ABNORMAL HIGH
SSA (Ro) (ENA) Antibody, IgG: <1
SSB (La) (ENA) Antibody, IgG: <1
Scleroderma (Scl-70) (ENA) Antibody, IgG: <1
ds DNA Ab: 12 IU/mL — ABNORMAL HIGH

## 2016-09-20 LAB — URINALYSIS, MICROSCOPIC ONLY
Bacteria, UA: NONE SEEN [HPF]
Casts: NONE SEEN [LPF]
Crystals: NONE SEEN [HPF]
RBC / HPF: NONE SEEN RBC/HPF (ref <=2)
Squamous Epithelial / LPF: NONE SEEN [HPF] (ref <=5)
Yeast: NONE SEEN [HPF]

## 2016-09-20 LAB — SEDIMENTATION RATE: Sed Rate: 5 mm/hr (ref 0–30)

## 2016-09-20 LAB — CK: Total CK: 145 U/L — ABNORMAL HIGH (ref 29–143)

## 2016-09-20 LAB — C3 AND C4
C3 Complement: 112 mg/dL (ref 83–193)
C4 Complement: 18 mg/dL (ref 15–57)

## 2016-09-20 LAB — ANA: Anti Nuclear Antibody(ANA): POSITIVE — AB

## 2016-09-21 ENCOUNTER — Other Ambulatory Visit (HOSPITAL_COMMUNITY)
Admission: RE | Admit: 2016-09-21 | Discharge: 2016-09-21 | Disposition: A | Discharge status: Home / Self Care | Payer: BC Managed Care – PPO | Source: Ambulatory Visit | Attending: Family Medicine | Admitting: Family Medicine

## 2016-09-21 ENCOUNTER — Ambulatory Visit (INDEPENDENT_AMBULATORY_CARE_PROVIDER_SITE_OTHER): Payer: BC Managed Care – PPO | Admitting: Family Medicine

## 2016-09-21 ENCOUNTER — Encounter: Payer: Self-pay | Admitting: Family Medicine

## 2016-09-21 VITALS — BP 122/82 | HR 70 | Resp 16 | Ht 62.0 in | Wt 137.0 lb

## 2016-09-21 DIAGNOSIS — Z0000 Encounter for general adult medical examination without abnormal findings: Secondary | ICD-10-CM

## 2016-09-21 DIAGNOSIS — Z1211 Encounter for screening for malignant neoplasm of colon: Secondary | ICD-10-CM | POA: Diagnosis not present

## 2016-09-21 DIAGNOSIS — Z124 Encounter for screening for malignant neoplasm of cervix: Secondary | ICD-10-CM

## 2016-09-21 LAB — POC HEMOCCULT BLD/STL (OFFICE/1-CARD/DIAGNOSTIC): Fecal Occult Blood, POC: NEGATIVE

## 2016-09-21 LAB — PROTEIN ELECTROPHORESIS, SERUM, WITH REFLEX
Albumin ELP: 3.7 g/dL — ABNORMAL LOW (ref 3.8–4.8)
Alpha-1-Globulin: 0.5 g/dL — ABNORMAL HIGH (ref 0.2–0.3)
Alpha-2-Globulin: 0.6 g/dL (ref 0.5–0.9)
Beta 2: 0.4 g/dL (ref 0.2–0.5)
Beta Globulin: 0.4 g/dL (ref 0.4–0.6)
Gamma Globulin: 1.5 g/dL (ref 0.8–1.7)
Total Protein, Serum Electrophoresis: 7.1 g/dL (ref 6.1–8.1)

## 2016-09-21 LAB — BETA-2 GLYCOPROTEIN ANTIBODIES
Beta-2 Glyco I IgG: <9 SGU (ref <=20)
Beta-2-Glycoprotein I IgA: <9 SAU (ref <=20)
Beta-2-Glycoprotein I IgM: <9 SMU (ref <=20)

## 2016-09-21 LAB — CARDIOLIPIN ANTIBODIES, IGG, IGM, IGA
Anticardiolipin IgA: <11 [APL'U]
Anticardiolipin IgG: <14 [GPL'U]
Anticardiolipin IgM: <12 [MPL'U]

## 2016-09-21 NOTE — Patient Instructions (Addendum)
F/u early January , call if you need me before please  Please work on reducing sweets and starchy foods, eat fruit and do not drink fruit juice , so that your blood sugar improves.  Keep dancing!!  Please schedule and get your mammogram when due sept 28 or after  We will give you information re healthy tips to eat to helplower and control blood sugar and also encourage you to call to speak with a diabetic/ prediabetic educator, call [employer] regarding this  Non fast hBA1c 3 to 5 days before visit, pls call/ send a message for lab order in December  Thank you  for choosing [facility]. We consider it a privelige to serve you.  Delivering excellent health care in a caring and  compassionate way is our goal.  Partnering with you,  so that together we can achieve this goal is our strategy.

## 2016-09-22 LAB — RFX DRVVT SCR W/RFLX CONF 1:1 MIX: dRVVT Screen: 43 s (ref <=45)

## 2016-09-22 LAB — LUPUS ANTICOAGULANT PANEL

## 2016-09-22 LAB — RFX PTT-LA W/RFX TO HEX PHASE CONF: PTT-LA Screen: 33 s (ref <=40)

## 2016-09-22 NOTE — Progress Notes (Signed)
Labs c/e SLE . Will discuss labs at the fu visit. Ok to continue [employer]

## 2016-09-23 NOTE — Assessment & Plan Note (Signed)

## 2016-09-23 NOTE — Progress Notes (Signed)
    [name]     MRN: [id number]      DOB: 1950-08-12  HPI: Patient is in for annual physical exam. No other health concerns are expressed or addressed at the visit. Recent labs, if available are reviewed. Immunization is reviewed , and  updated if needed.   PE: BP 122/82   Pulse 70   Resp 16   Ht 5\' 2"  (1.575 m)   Wt 137 lb (62.1 kg)   SpO2 100%   BMI 25.06 kg/m   Pleasant  female, alert and oriented x 3, in no cardio-pulmonary distress. Afebrile. HEENT No facial trauma or asymetry. Sinuses non tender.  Extra occullar muscles intact, pupils equally reactive to light. External ears normal, tympanic membranes clear. Oropharynx moist, no exudate. Neck: supple, no adenopathy,JVD or thyromegaly.No bruits.  Chest: Clear to ascultation bilaterally.No crackles or wheezes. Non tender to palpation  Breast: No asymetry,no masses or lumps. No tenderness. No nipple discharge or inversion. No axillary or supraclavicular adenopathy  Cardiovascular system; Heart sounds normal,  S1 and  S2 ,no S3.  No murmur, or thrill. Apical beat not displaced Peripheral pulses normal.  Abdomen: Soft, non tender, no organomegaly or masses. No bruits. Bowel sounds normal. No guarding, tenderness or rebound.  Rectal:  Normal sphincter tone. No rectal mass. Guaiac negative stool.  GU: External genitalia normal female genitalia , normal female distribution of hair. No lesions. Urethral meatus normal in size, no  Prolapse, no lesions visibly  Present. Bladder non tender. Vagina pink and moist , with no visible lesions , discharge present . Adequate pelvic support no  cystocele or rectocele noted  Uterus absent no adnexal tenderness.   Musculoskeletal exam: Full ROM of spine, hips , shoulders and knees. No deformity ,swelling or crepitus noted. No muscle wasting or atrophy.   Neurologic: Cranial nerves 2 to 12 intact. Power, tone ,sensation and reflexes normal throughout. No  disturbance in gait. No tremor.  Skin: Intact, no ulceration, erythema , scaling or rash noted. Pigmentation normal throughout  Psych; Normal mood and affect. Judgement and concentration normal   Assessment & Plan:  Annual physical exam Annual exam as documented. Counseling done  re healthy lifestyle involving commitment to 150 minutes exercise per week, heart healthy diet, and attaining healthy weight.The importance of adequate sleep also discussed. Regular seat belt use and home safety, is also discussed. Changes in health habits are decided on by the patient with goals and time frames  set for achieving them. Immunization and cancer screening needs are specifically addressed at this visit.

## 2016-09-26 ENCOUNTER — Encounter: Payer: Self-pay | Admitting: Family Medicine

## 2016-09-26 ENCOUNTER — Telehealth: Payer: Self-pay | Admitting: Rheumatology

## 2016-09-26 LAB — CYTOLOGY - PAP
Diagnosis: NEGATIVE
HPV: NOT DETECTED

## 2016-09-26 NOTE — Telephone Encounter (Signed)
Patient returned your call.

## 2016-09-26 NOTE — Telephone Encounter (Signed)
Patient advised of lab results and verbalized understanding.  

## 2016-09-26 NOTE — Telephone Encounter (Signed)
Attempted to contact the patient and left message for patient to call the office.  

## 2016-09-26 NOTE — Telephone Encounter (Signed)
-----   Message from Bo Merino, MD sent at 09/22/2016 11:54 AM EDT ----- Labs c/e SLE . Will discuss labs at the fu visit. Ok to continue [employer]

## 2016-10-12 ENCOUNTER — Telehealth: Payer: Self-pay

## 2016-10-12 MED ORDER — HYDROXYCHLOROQUINE SULFATE 200 MG PO TABS: 200.0000 mg | ORAL_TABLET | Freq: Two times a day (BID) | ORAL | 2 refills | Status: DC

## 2016-10-12 NOTE — Telephone Encounter (Signed)
Last Visit: 09/19/16 Next Visit: 12/20/16 Labs: 09/18/16 Labs c/e SLE  PLQ Eye Exam: 09/11/16 WNL  Okay to refill per Dr. Estanislado Pandy

## 2016-10-12 NOTE — Addendum Note (Signed)
Addended by: Carole Binning on: 10/12/2016 09:03 AM   Modules accepted: Orders

## 2016-10-12 NOTE — Telephone Encounter (Signed)
Prescription sent to the pharmacy and patient advised.  

## 2016-10-12 NOTE — Telephone Encounter (Signed)
Patient would like a Rx refill on PLQ.  Cb# is [id number].  Please Advise.  Thank You.

## 2016-10-19 ENCOUNTER — Ambulatory Visit: Payer: Self-pay | Admitting: Rheumatology

## 2016-12-08 NOTE — Progress Notes (Signed)
Office Visit Note  Patient: [name]             Date of Birth: 03/18/1951           MRN: [id number]             PCP: [name], MD Referring: [name], MD Visit Date: 12/20/2016 Occupation: _0 @    Subjective:  Medication Management   History of Present Illness: [name] is a 66 y.o. female with history of systemic lupus and lupus nephritis. She states she's been doing quite well on Plaquenil. She denies any joint swelling or fatigue. She's not had any rash in long time. She states she has not seen a nephrologist in a while.  Activities of Daily Living:  Patient denies morning stiffness   .   Patient Denies nocturnal pain.  Difficulty dressing/grooming: Denies Difficulty climbing stairs: Denies Difficulty getting out of chair: Denies Difficulty using hands for taps, buttons, cutlery, and/or writing: Denies   Review of Systems  Constitutional: Negative.  Negative for fatigue, night sweats, weight gain, weight loss and weakness.  HENT: Negative.  Negative for mouth sores, trouble swallowing, trouble swallowing, mouth dryness and nose dryness.   Eyes: Positive for dryness. Negative for pain, redness and visual disturbance.  Respiratory: Negative.  Negative for cough, shortness of breath and difficulty breathing.   Cardiovascular: Negative.  Negative for chest pain, palpitations, hypertension, irregular heartbeat and swelling in legs/feet.  Gastrointestinal: Negative.  Negative for blood in stool, constipation and diarrhea.  Endocrine: Negative.  Negative for increased urination.  Genitourinary: Negative.  Negative for vaginal dryness.  Musculoskeletal: Negative.  Negative for arthralgias, joint pain, joint swelling, myalgias, muscle weakness, morning stiffness, muscle tenderness and myalgias.  Skin: Negative.  Negative for color change, rash, hair loss, skin tightness, ulcers and sensitivity to sunlight.  Allergic/Immunologic: Negative.   Negative for susceptible to infections.  Neurological: Negative.  Negative for dizziness, memory loss and night sweats.  Hematological: Negative.  Negative for swollen glands.  Psychiatric/Behavioral: Negative.  Negative for depressed mood and sleep disturbance. The patient is not nervous/anxious.     PMFS History:  Patient Active Problem List   Diagnosis Date Noted  . Lupus nephritis ([place]) 1999-2000  09/15/2016  . High risk medication use 09/15/2016  . DDD (degenerative disc disease), lumbar 09/15/2016  . History of hyperlipidemia 09/15/2016  . History of anemia 09/15/2016  . History of proteinuria  09/15/2016  . Dermatitis of face 09/15/2016  . Hair loss 09/15/2016  . Tubular adenoma of colon 04/30/2016  . Annual physical exam 09/07/2015  . Prediabetes 11/15/2014  . Osteopenia 07/21/2013  . Anemia 12/22/2012  . SLE (systemic lupus erythematosus) ([place]) 02/15/2012  . Dyslipidemia 10/01/2007  . History of diverticulosis 10/01/2007    Past Medical History:  Diagnosis Date  . Arthritis 02/13/10   abnormal MRI  . SLE (systemic lupus erythematosus) ([place]) dx apprx 1990   reports immunotherapy, cytoxan and steroids at [facility], no f/u for over 78yr    Family History  Problem Relation Age of Onset  . Heart disease Father   . Cancer Brother [phone]      breast  . Cancer Sister 524      breast  . Colon cancer Neg Hx    Past Surgical History:  Procedure Laterality Date  . ABDOMINAL HYSTERECTOMY  unsure, over 10 yrs   partial, has had abnormal pap  . BLADDER REPAIR    . COLONOSCOPY N/A 01/05/2014  Procedure: COLONOSCOPY;  Surgeon: Daneil Dolin, MD;  Location: [facility];  Service: Endoscopy;  Laterality: N/A;  12:15 PM   Social History   Social History Narrative  . No narrative on file     Objective: Vital Signs: BP 128/78   Pulse 66   Resp 14   Ht _0  (1.575 m)   Wt 134 lb (60.8 kg)   BMI 24.51 kg/m    Physical Exam  Constitutional: She is oriented to person,  place, and time. She appears well-developed and well-nourished.  HENT:  Head: Normocephalic and atraumatic.  Eyes: Conjunctivae and EOM are normal.  Neck: Normal range of motion.  Cardiovascular: Normal rate, regular rhythm, normal heart sounds and intact distal pulses.   Pulmonary/Chest: Effort normal and breath sounds normal.  Abdominal: Soft. Bowel sounds are normal.  Lymphadenopathy:    She has no cervical adenopathy.  Neurological: She is alert and oriented to person, place, and time.  Skin: Skin is warm and dry. Capillary refill takes less than 2 seconds.  Psychiatric: She has a normal mood and affect. Her behavior is normal.  Nursing note and vitals reviewed.    Musculoskeletal Exam: C-spine and thoracic lumbar spine good range of motion. Shoulder joints elbow joints wrist joint MCPs PIPs DIPs with good range of motion with no synovitis. Hip joints knee joints ankles MTPs PIPs DIPs with good range of motion with no synovitis.  CDAI Exam: CDAI Homunculus Exam:   Joint Counts:  CDAI Tender Joint count: 0 CDAI Swollen Joint count: 0  Global Assessments:  Patient Global Assessment: 0 Provider Global Assessment: 0    Investigation: No additional findings.PLQ eye exam: 08/2016 CBC Latest Ref Rng & Units 09/18/2016 09/02/2015 01/19/2014  WBC 3.8 - 10.8 K/uL 3.6(L) 3.6(L) 4.2  Hemoglobin 11.7 - 15.5 g/dL 12.5 11.6(L) 11.7(L)  Hematocrit 35.0 - 45.0 % 39.0 35.6 35.6(L)  Platelets 140 - 400 K/uL 164 161 183   CMP Latest Ref Rng & Units 09/18/2016 02/24/2016 09/02/2015  Glucose 65 - 99 mg/dL 88 83 82  BUN 7 - 25 mg/dL _1 Creatinine 0.50 - 0.99 mg/dL 1.33(H) 1.14(H) 1.35(H)  Sodium 135 - 146 mmol/L 141 139 139  Potassium 3.5 - 5.3 mmol/L 4.2 4.3 4.1  Chloride 98 - 110 mmol/L 107 104 106  CO2 20 - 31 mmol/L _2 Calcium 8.6 - 10.4 mg/dL 9.6 9.4 9.3  Total Protein 6.1 - 8.1 g/dL 7.0 - 7.1  Total Bilirubin 0.2 - 1.2 mg/dL 0.4 - 0.4  Alkaline Phos 33 - 130 U/L 43 - 33    AST 10 - 35 U/L 19 - 25  ALT 6 - 29 U/L 13 - 16   ANA 1:40 homogeneous, C3-C4 normal, double-stranded DNA 12, rest of the ENA negative, CK 145, ESR 5, beta-2 negative, anticardiolipin negative, lupus anticoagulant negative, UA for negative for protein0 Imaging: No results found.  Speciality Comments: No specialty comments available.    Procedures:  No procedures performed Allergies: Sulfonamide derivatives   Assessment / Plan:     Visit Diagnoses: Systemic lupus erythematosus, unspecified SLE type, unspecified organ involvement status ([place]) - dsDNA +64, Smith + 5.5, RNP+ 6.5, SSA([place]) Negative, SSB ([place]) Negative, patient presented initially with nephritis, hair loss, arthralgias, dermatitis. Patient is doing clinically very well now without any joint pain or joint swelling. She denies any rash, oral ulcers or nasal ulcers.  High risk medication use - Plaquenil 232m po bid. eye exam: 08/2016  Lupus nephritis ([place]) - 1999-2000 treated with Cytoxan IV for total for dosage and prednisone for one year. She's not been followed by nephrologist.  History of renal insufficiency - GFR 40-50  Creat 1.15-1.35 range . Her labs have been stable.  History of proteinuria syndrome. Her most recent labs did not show any proteinuria.  Dermatitis - face and scalp. Resolved after using topical agents.  DDD (degenerative disc disease), lumbar - She also has sciatica and has been followed by chiropractor.  Osteopenia of multiple sites - She is on calcium and  vitamin D 1000 units every day. I would like her to maintain her vitamin D between 40 and 50.  Her other medical problems are listed as follows:  History of hyperlipidemia  History of anemia  History of prediabetes  History of adenomatous polyp of colon  History of diverticulosis    Orders: Orders Placed This Encounter  Procedures  . CBC with Differential/Platelet  . COMPLETE METABOLIC PANEL WITH GFR  . Anti-DNA antibody,  double-stranded  . C3 and C4  . Urinalysis, Routine w reflex microscopic  . Sedimentation rate  . VITAMIN D 25 Hydroxy (Vit-D Deficiency, Fractures)   No orders of the defined types were placed in this encounter.     Follow-Up Instructions: Return in about 5 months (around 05/22/2017) for Systemic lupus.   Bo Merino, MD  Note - This record has been created using Editor, commissioning.  Chart creation errors have been sought, but may not always  have been located. Such creation errors do not reflect on  the standard of medical care.

## 2016-12-20 ENCOUNTER — Ambulatory Visit (INDEPENDENT_AMBULATORY_CARE_PROVIDER_SITE_OTHER): Payer: BC Managed Care – PPO | Admitting: Rheumatology

## 2016-12-20 ENCOUNTER — Other Ambulatory Visit: Payer: Self-pay | Admitting: Family Medicine

## 2016-12-20 ENCOUNTER — Encounter: Payer: Self-pay | Admitting: Rheumatology

## 2016-12-20 VITALS — BP 128/78 | HR 66 | Resp 14 | Ht 62.0 in | Wt 134.0 lb

## 2016-12-20 DIAGNOSIS — M329 Systemic lupus erythematosus, unspecified: Secondary | ICD-10-CM | POA: Diagnosis not present

## 2016-12-20 DIAGNOSIS — Z8639 Personal history of other endocrine, nutritional and metabolic disease: Secondary | ICD-10-CM

## 2016-12-20 DIAGNOSIS — Z87898 Personal history of other specified conditions: Secondary | ICD-10-CM | POA: Diagnosis not present

## 2016-12-20 DIAGNOSIS — M8589 Other specified disorders of bone density and structure, multiple sites: Secondary | ICD-10-CM

## 2016-12-20 DIAGNOSIS — Z1231 Encounter for screening mammogram for malignant neoplasm of breast: Secondary | ICD-10-CM

## 2016-12-20 DIAGNOSIS — Z79899 Other long term (current) drug therapy: Secondary | ICD-10-CM | POA: Diagnosis not present

## 2016-12-20 DIAGNOSIS — Z87448 Personal history of other diseases of urinary system: Secondary | ICD-10-CM | POA: Diagnosis not present

## 2016-12-20 DIAGNOSIS — M5136 Other intervertebral disc degeneration, lumbar region: Secondary | ICD-10-CM | POA: Diagnosis not present

## 2016-12-20 DIAGNOSIS — Z862 Personal history of diseases of the blood and blood-forming organs and certain disorders involving the immune mechanism: Secondary | ICD-10-CM | POA: Diagnosis not present

## 2016-12-20 DIAGNOSIS — M3214 Glomerular disease in systemic lupus erythematosus: Secondary | ICD-10-CM | POA: Diagnosis not present

## 2016-12-20 DIAGNOSIS — L309 Dermatitis, unspecified: Secondary | ICD-10-CM

## 2016-12-20 DIAGNOSIS — Z8719 Personal history of other diseases of the digestive system: Secondary | ICD-10-CM | POA: Diagnosis not present

## 2016-12-20 DIAGNOSIS — Z86010 Personal history of colonic polyps: Secondary | ICD-10-CM | POA: Diagnosis not present

## 2016-12-20 LAB — COMPLETE METABOLIC PANEL WITH GFR
AG Ratio: 1.2 (calc) (ref 1.0–2.5)
ALT: 18 U/L (ref 6–29)
AST: 27 U/L (ref 10–35)
Albumin: 3.7 g/dL (ref 3.6–5.1)
Alkaline phosphatase (APISO): 46 U/L (ref 33–130)
BUN/Creatinine Ratio: 17 (calc) (ref 6–22)
BUN: 22 mg/dL (ref 7–25)
CO2: 26 mmol/L (ref 20–32)
Calcium: 9.1 mg/dL (ref 8.6–10.4)
Chloride: 106 mmol/L (ref 98–110)
Creat: 1.32 mg/dL — ABNORMAL HIGH (ref 0.50–0.99)
GFR, Est African American: 49 mL/min/{1.73_m2} — ABNORMAL LOW (ref >=60)
GFR, Est Non African American: 43 mL/min/{1.73_m2} — ABNORMAL LOW (ref >=60)
Globulin: 3.2 g/dL (calc) (ref 1.9–3.7)
Glucose, Bld: 71 mg/dL (ref 65–99)
Potassium: 4.5 mmol/L (ref 3.5–5.3)
Sodium: 140 mmol/L (ref 135–146)
Total Bilirubin: 0.3 mg/dL (ref 0.2–1.2)
Total Protein: 6.9 g/dL (ref 6.1–8.1)

## 2016-12-20 LAB — CBC WITH DIFFERENTIAL/PLATELET
Basophils Absolute: 39 cells/uL (ref 0–200)
Basophils Relative: 0.8 %
Eosinophils Absolute: 78 cells/uL (ref 15–500)
Eosinophils Relative: 1.6 %
HCT: 37.4 % (ref 35.0–45.0)
Hemoglobin: 12.1 g/dL (ref 11.7–15.5)
Lymphs Abs: 1730 cells/uL (ref 850–3900)
MCH: 27.3 pg (ref 27.0–33.0)
MCHC: 32.4 g/dL (ref 32.0–36.0)
MCV: 84.4 fL (ref 80.0–100.0)
MPV: 12.4 fL (ref 7.5–12.5)
Monocytes Relative: 13.9 %
Neutro Abs: 2372 cells/uL (ref 1500–7800)
Neutrophils Relative %: 48.4 %
Platelets: 165 10*3/uL (ref 140–400)
RBC: 4.43 10*6/uL (ref 3.80–5.10)
RDW: 13.5 % (ref 11.0–15.0)
Total Lymphocyte: 35.3 %
WBC mixed population: 681 cells/uL (ref 200–950)
WBC: 4.9 10*3/uL (ref 3.8–10.8)

## 2016-12-20 NOTE — Patient Instructions (Addendum)
Standing Labs We placed an order today for your standing lab work.    Please come back and get your standing labs in December, 2018, then every 3 months.  Return in 6 months for Vit D, DNA, C3C4, SED rate and urine analysis.  We have open lab Monday through Friday from 8:30-11:30 AM and 1:30-4 PM at the office of Dr. Bo Merino.   The office is located at [address] [place], [place], [place] [address] No appointment is necessary.   Labs are drawn by [facility].  You may receive a bill from [facility] for your lab work. If you have any questions regarding directions or hours of operation,  please call [phone].

## 2016-12-21 NOTE — Progress Notes (Signed)
Her creatinine is elevated. She has not seen a nephrologist in a long time. I would recommend following up with the nephrologist

## 2016-12-22 ENCOUNTER — Telehealth: Payer: Self-pay | Admitting: Rheumatology

## 2016-12-22 NOTE — Progress Notes (Signed)
Labs are stable. GFR is low. His refer her to nephrology.

## 2016-12-22 NOTE — Telephone Encounter (Signed)
Patient left a message 9/27, returning Andrea's call.

## 2016-12-22 NOTE — Telephone Encounter (Signed)
Already spoke with patient about labs.

## 2016-12-25 ENCOUNTER — Telehealth: Payer: Self-pay | Admitting: *Deleted

## 2016-12-25 DIAGNOSIS — R944 Abnormal results of kidney function studies: Secondary | ICD-10-CM

## 2016-12-25 NOTE — Telephone Encounter (Signed)
-----   Message from Bo Merino, MD sent at 12/22/2016  2:25 PM EDT ----- Labs are stable. GFR is low. His refer her to nephrology.

## 2016-12-29 ENCOUNTER — Ambulatory Visit (HOSPITAL_COMMUNITY)
Admission: RE | Admit: 2016-12-29 | Discharge: 2016-12-29 | Disposition: A | Discharge status: Home / Self Care | Payer: BC Managed Care – PPO | Source: Ambulatory Visit | Attending: Family Medicine | Admitting: Family Medicine

## 2016-12-29 DIAGNOSIS — Z1231 Encounter for screening mammogram for malignant neoplasm of breast: Secondary | ICD-10-CM | POA: Insufficient documentation

## 2017-01-29 ENCOUNTER — Other Ambulatory Visit: Payer: Self-pay | Admitting: Rheumatology

## 2017-01-29 NOTE — Telephone Encounter (Signed)
Last Visit: 12/20/16 Last Visit: 05/22/17 Labs: 12/20/16 stable PLQ Eye Exam: 09/12/16 WNL   Okay to refill per Dr. Estanislado Pandy

## 2017-03-08 DIAGNOSIS — R809 Proteinuria, unspecified: Secondary | ICD-10-CM | POA: Insufficient documentation

## 2017-03-08 DIAGNOSIS — E785 Hyperlipidemia, unspecified: Secondary | ICD-10-CM | POA: Insufficient documentation

## 2017-03-22 ENCOUNTER — Other Ambulatory Visit (HOSPITAL_COMMUNITY): Payer: Self-pay | Admitting: Nephrology

## 2017-03-22 DIAGNOSIS — N183 Chronic kidney disease, stage 3 unspecified: Secondary | ICD-10-CM

## 2017-03-28 ENCOUNTER — Encounter: Payer: Self-pay | Admitting: Family Medicine

## 2017-03-28 ENCOUNTER — Ambulatory Visit: Payer: BC Managed Care – PPO | Admitting: Family Medicine

## 2017-03-28 VITALS — BP 114/74 | HR 74 | Resp 16 | Ht 62.0 in | Wt 136.0 lb

## 2017-03-28 DIAGNOSIS — E785 Hyperlipidemia, unspecified: Secondary | ICD-10-CM | POA: Diagnosis not present

## 2017-03-28 DIAGNOSIS — M3219 Other organ or system involvement in systemic lupus erythematosus: Secondary | ICD-10-CM | POA: Diagnosis not present

## 2017-03-28 DIAGNOSIS — R7303 Prediabetes: Secondary | ICD-10-CM

## 2017-03-28 DIAGNOSIS — Z8719 Personal history of other diseases of the digestive system: Secondary | ICD-10-CM

## 2017-03-28 NOTE — Patient Instructions (Signed)
Annual physical exam June 30 or after, call of you need me sooner   Fasting lipid, and cmp  7 and eGFR this week please   Continue to take care of yourself with healthy diet and regular exercise   All the best for 2019  64 oz water daily is recommended

## 2017-03-31 ENCOUNTER — Encounter: Payer: Self-pay | Admitting: Family Medicine

## 2017-03-31 LAB — COMPLETE METABOLIC PANEL WITH GFR
AG Ratio: 1.2 (calc) (ref 1.0–2.5)
ALT: 16 U/L (ref 6–29)
AST: 20 U/L (ref 10–35)
Albumin: 3.7 g/dL (ref 3.6–5.1)
Alkaline phosphatase (APISO): 48 U/L (ref 33–130)
BUN/Creatinine Ratio: 19 (calc) (ref 6–22)
BUN: 20 mg/dL (ref 7–25)
CO2: 28 mmol/L (ref 20–32)
Calcium: 9.2 mg/dL (ref 8.6–10.4)
Chloride: 107 mmol/L (ref 98–110)
Creat: 1.03 mg/dL — ABNORMAL HIGH (ref 0.50–0.99)
GFR, Est African American: 67 mL/min/{1.73_m2} (ref >=60)
GFR, Est Non African American: 57 mL/min/{1.73_m2} — ABNORMAL LOW (ref >=60)
Globulin: 3 g/dL (calc) (ref 1.9–3.7)
Glucose, Bld: 83 mg/dL (ref 65–99)
Potassium: 4.4 mmol/L (ref 3.5–5.3)
Sodium: 142 mmol/L (ref 135–146)
Total Bilirubin: 0.4 mg/dL (ref 0.2–1.2)
Total Protein: 6.7 g/dL (ref 6.1–8.1)

## 2017-03-31 LAB — LIPID PANEL
Cholesterol: 152 mg/dL (ref <200)
HDL: 34 mg/dL — ABNORMAL LOW (ref >50)
LDL Cholesterol (Calc): 95 mg/dL (calc)
Non-HDL Cholesterol (Calc): 118 mg/dL (calc) (ref <130)
Total CHOL/HDL Ratio: 4.5 (calc) (ref <5.0)
Triglycerides: 135 mg/dL (ref <150)

## 2017-03-31 NOTE — Assessment & Plan Note (Signed)
No recent flare.  

## 2017-03-31 NOTE — Assessment & Plan Note (Signed)
Currently being treated with methotrexate followed by rheumatology, now manifesting renal involvement, has started seeing nephrologist in past 4 months

## 2017-03-31 NOTE — Assessment & Plan Note (Signed)
Hyperlipidemia:Low fat diet discussed and encouraged.   Lipid Panel  Lab Results  Component Value Date   CHOL 152 03/30/2017   HDL 34 (L) 03/30/2017   LDLCALC 112 (H) 09/18/2016   TRIG 135 03/30/2017   CHOLHDL 4.5 03/30/2017     Updated lab needed at/ before next visit.

## 2017-03-31 NOTE — Progress Notes (Signed)
[name]     MRN: [id number]      DOB: [birth year]   HPI [name] is here for follow up and re-evaluation of chronic medical conditions, medication management and review of any available recent lab and radiology data.  Preventive health is updated, specifically  Cancer screening and Immunization.   Questions or concerns regarding consultations or procedures which the PT has had in the interim are  Addressed. Being followed closely by rheumatology for her lupus and now recently showing renal involvement with abnormal kidney function, Now being followed by nephrology as well. Her spouse fractured his leg several weeks ago an she is under increased stress at home as a result currently, looking for help with his care Still refusing HIV  and Hep V screening tests , states she does not think she could handle a positive result  ROS Denies recent fever or chills. Denies sinus pressure, nasal congestion, ear pain or sore throat. Denies chest congestion, productive cough or wheezing. Denies chest pains, palpitations and leg swelling Denies abdominal pain, nausea, vomiting,diarrhea or constipation.   Denies dysuria, frequency, hesitancy or incontinence. Denies joint pain, swelling and limitation in mobility. Denies headaches, seizures, numbness, or tingling.  Denies skin break down or rash.   PE  BP 114/74   Pulse 74   Resp 16   Ht 5\' 2"  (1.575 m)   Wt 136 lb (61.7 kg)   SpO2 98%   BMI 24.87 kg/m   Patient alert and oriented and in no cardiopulmonary distress.  HEENT: No facial asymmetry, EOMI,   oropharynx pink and moist.  Neck supple no JVD, no mass.  Chest: Clear to auscultation bilaterally.  CVS: S1, S2 no murmurs, no S3.Regular rate.  ABD: Soft non tender.   Ext: No edema  MS: Adequate ROM spine, shoulders, hips and knees.  Skin: Intact, no ulcerations or rash noted.  Psych: Good eye contact, normal affect. Memory intact, mildly  anxious not  depressed  appearing.  CNS: CN 2-12 intact, power,  normal throughout.no focal deficits noted.   Assessment & Plan  SLE (systemic lupus erythematosus) Currently being treated with methotrexate followed by rheumatology, now manifesting renal involvement, has started seeing nephrologist in past 4 months  Dyslipidemia Hyperlipidemia:Low fat diet discussed and encouraged.   Lipid Panel  Lab Results  Component Value Date   CHOL 152 03/30/2017   HDL 34 (L) 03/30/2017   LDLCALC 112 (H) 09/18/2016   TRIG 135 03/30/2017   CHOLHDL 4.5 03/30/2017     Updated lab needed at/ before next visit.   Prediabetes Patient educated about the importance of limiting  Carbohydrate intake , the need to commit to daily physical activity for a minimum of 30 minutes , and to commit weight loss. The fact that changes in all these areas will reduce or eliminate all together the development of diabetes is stressed.  Updated lab needed at/ before next visit.   Diabetic Labs Latest Ref Rng & Units 03/30/2017 12/20/2016 09/18/2016 02/24/2016 09/02/2015  HbA1c <5.7 % - - 6.2(H) 5.9(H) 6.1(H)  Chol <200 mg/dL 152 - 180 - 165  HDL >50 mg/dL 34(L) - 30(L) - 36(L)  Calc LDL <100 mg/dL - - 112(H) - 101  Triglycerides <150 mg/dL 135 - 188(H) - 138  Creatinine 0.50 - 0.99 mg/dL 1.03(H) 1.32(H) 1.33(H) 1.14(H) 1.35(H)   BP/Weight 03/28/2017 12/20/2016 09/21/2016 09/19/2016 05/10/2016 04/25/2016 87/86/7672  Systolic BP 094 709 628 366 294 765 465  Diastolic BP 74 78 82 74  81 80 68  Wt. (Lbs) 136 134 137 140 141 141 137  BMI 24.87 24.51 25.06 25.61 25.79 25.79 25.06   No flowsheet data found.    History of diverticulosis No recent flare

## 2017-03-31 NOTE — Assessment & Plan Note (Signed)
Patient educated about the importance of limiting  Carbohydrate intake , the need to commit to daily physical activity for a minimum of 30 minutes , and to commit weight loss. The fact that changes in all these areas will reduce or eliminate all together the development of diabetes is stressed.  Updated lab needed at/ before next visit.   Diabetic Labs Latest Ref Rng & Units 03/30/2017 12/20/2016 09/18/2016 02/24/2016 09/02/2015  HbA1c <5.7 % - - 6.2(H) 5.9(H) 6.1(H)  Chol <200 mg/dL 152 - 180 - 165  HDL >50 mg/dL 34(L) - 30(L) - 36(L)  Calc LDL <100 mg/dL - - 112(H) - 101  Triglycerides <150 mg/dL 135 - 188(H) - 138  Creatinine 0.50 - 0.99 mg/dL 1.03(H) 1.32(H) 1.33(H) 1.14(H) 1.35(H)   BP/Weight 03/28/2017 12/20/2016 09/21/2016 09/19/2016 05/10/2016 04/25/2016 78/24/2353  Systolic BP 614 431 540 086 761 950 932  Diastolic BP 74 78 82 74 81 80 68  Wt. (Lbs) 136 134 137 140 141 141 137  BMI 24.87 24.51 25.06 25.61 25.79 25.79 25.06   No flowsheet data found.

## 2017-04-02 ENCOUNTER — Ambulatory Visit (HOSPITAL_COMMUNITY)
Admission: RE | Admit: 2017-04-02 | Discharge: 2017-04-02 | Disposition: A | Discharge status: Home / Self Care | Payer: BC Managed Care – PPO | Source: Ambulatory Visit | Attending: Nephrology | Admitting: Nephrology

## 2017-04-02 DIAGNOSIS — N183 Chronic kidney disease, stage 3 unspecified: Secondary | ICD-10-CM

## 2017-05-08 NOTE — Progress Notes (Signed)
Office Visit Note  Patient: [name]             Date of Birth: [birth year]           MRN: [id number]             PCP: [name], MD Referring: [name], MD Visit Date: 05/22/2017 Occupation: @GUAROCC @    Subjective:  Medication monitoring   History of Present Illness: [name] is a 67 y.o. female history of systemic lupus erythematosus and lupus nephritis.  She states she has been doing quite well currently.  She went to see Dr. Cristela Felt, the nephrologist in the [place].  Patient states she had extensive workup including 24-hour urine collection.  He told patient that the workup was negative and no further treatment was indicated.  I do not have that report available today.  She states she is doing clinically better and has no increased symptoms currently.  She denies any recent rash.  Her lower back pain is better.  Activities of Daily Living:  Patient reports morning stiffness for 0 minutes.   Patient Denies nocturnal pain.  Difficulty dressing/grooming: Denies Difficulty climbing stairs: Denies Difficulty getting out of chair: Denies Difficulty using hands for taps, buttons, cutlery, and/or writing: Denies   Review of Systems  Constitutional: Negative for fatigue, night sweats, weight gain, weight loss and weakness.  HENT: Negative for mouth sores, trouble swallowing, trouble swallowing, mouth dryness and nose dryness.   Eyes: Positive for dryness. Negative for pain, redness and visual disturbance.  Respiratory: Negative for cough, shortness of breath and difficulty breathing.   Cardiovascular: Negative for chest pain, palpitations, hypertension, irregular heartbeat and swelling in legs/feet.  Gastrointestinal: Negative for blood in stool, constipation and diarrhea.  Endocrine: Negative for heat intolerance and increased urination.  Genitourinary: Negative for pelvic pain and vaginal dryness.  Musculoskeletal: Negative for arthralgias,  joint pain, joint swelling, myalgias, muscle weakness, morning stiffness, muscle tenderness and myalgias.  Skin: Negative for color change, rash, hair loss, skin tightness, ulcers and sensitivity to sunlight.  Allergic/Immunologic: Negative for susceptible to infections.  Neurological: Negative for dizziness, headaches, memory loss and night sweats.  Hematological: Negative for bruising/bleeding tendency and swollen glands.  Psychiatric/Behavioral: Negative for depressed mood and sleep disturbance. The patient is not nervous/anxious.     PMFS History:  Patient Active Problem List   Diagnosis Date Noted  . Lupus nephritis ([place]) 1999-2000  09/15/2016  . High risk medication use 09/15/2016  . DDD (degenerative disc disease), lumbar 09/15/2016  . History of hyperlipidemia 09/15/2016  . History of anemia 09/15/2016  . History of proteinuria  09/15/2016  . Dermatitis of face 09/15/2016  . Hair loss 09/15/2016  . Tubular adenoma of colon 04/30/2016  . Prediabetes 11/15/2014  . Osteopenia 07/21/2013  . Anemia 12/22/2012  . SLE (systemic lupus erythematosus) ([place]) 02/15/2012  . Dyslipidemia 10/01/2007  . History of diverticulosis 10/01/2007    Past Medical History:  Diagnosis Date  . Arthritis 02/13/10   abnormal MRI  . SLE (systemic lupus erythematosus) ([place]) dx apprx 1990   reports immunotherapy, cytoxan and steroids at [facility], no f/u for over 19yrs    Family History  Problem Relation Age of Onset  . Heart disease Father   . Cancer Brother 33       breast  . Cancer Sister 49       breast  . Colon cancer Neg Hx    Past Surgical History:  Procedure Laterality Date  .  ABDOMINAL HYSTERECTOMY  unsure, over 10 yrs   partial, has had abnormal pap  . BLADDER REPAIR    . COLONOSCOPY N/A 01/05/2014   Procedure: COLONOSCOPY;  Surgeon: Daneil Dolin, MD;  Location: [facility];  Service: Endoscopy;  Laterality: N/A;  12:15 PM   Social History   Social History Narrative  . Not on  file     Objective: Vital Signs: BP 117/76 (BP Location: Left Arm, Patient Position: Sitting, Cuff Size: Normal)   Pulse 66   Resp 16   Ht 5\' 2"  (1.575 m)   Wt 141 lb (64 kg)   BMI 25.79 kg/m    Physical Exam  Constitutional: She is oriented to person, place, and time. She appears well-developed and well-nourished.  HENT:  Head: Normocephalic and atraumatic.  Eyes: Conjunctivae and EOM are normal.  Neck: Normal range of motion.  Cardiovascular: Normal rate, regular rhythm, normal heart sounds and intact distal pulses.  Pulmonary/Chest: Effort normal and breath sounds normal.  Abdominal: Soft. Bowel sounds are normal.  Lymphadenopathy:    She has no cervical adenopathy.  Neurological: She is alert and oriented to person, place, and time.  Skin: Skin is warm and dry. Capillary refill takes less than 2 seconds.  She has some hyper and hypopigmented lesions from previous rash.  No active rash was noted.  Psychiatric: She has a normal mood and affect. Her behavior is normal.  Nursing note and vitals reviewed.    Musculoskeletal Exam: C-spine thoracic lumbar spine good range of motion.  Shoulder joints elbow joints wrist joint MCPs PIPs DIPs with good range of motion with no synovitis.  Hip joints, knee joints, ankles and MTPs PIPs with good range of motion with no synovitis.  CDAI Exam: CDAI Homunculus Exam:   Joint Counts:  CDAI Tender Joint count: 0 CDAI Swollen Joint count: 0  Global Assessments:  Patient Global Assessment: 0 Provider Global Assessment: 0    Investigation: No additional findings.PLQ eye exam: 09/12/2016 CBC Latest Ref Rng & Units 12/20/2016 09/18/2016 09/02/2015  WBC 3.8 - 10.8 Thousand/uL 4.9 3.6(L) 3.6(L)  Hemoglobin 11.7 - 15.5 g/dL 12.1 12.5 11.6(L)  Hematocrit 35.0 - 45.0 % 37.4 39.0 35.6  Platelets 140 - 400 Thousand/uL 165 164 161   CMP Latest Ref Rng & Units 03/30/2017 12/20/2016 09/18/2016  Glucose 65 - 99 mg/dL 83 71 88  BUN 7 - 25 mg/dL 20 22  20   Creatinine 0.50 - 0.99 mg/dL 1.03(H) 1.32(H) 1.33(H)  Sodium 135 - 146 mmol/L 142 140 141  Potassium 3.5 - 5.3 mmol/L 4.4 4.5 4.2  Chloride 98 - 110 mmol/L 107 106 107  CO2 20 - 32 mmol/L 28 26 24   Calcium 8.6 - 10.4 mg/dL 9.2 9.1 9.6  Total Protein 6.1 - 8.1 g/dL 6.7 6.9 7.0  Total Bilirubin 0.2 - 1.2 mg/dL 0.4 0.3 0.4  Alkaline Phos 33 - 130 U/L - - 43  AST 10 - 35 U/L 20 27 19   ALT 6 - 29 U/L 16 18 13     Imaging: No results found.  Speciality Comments: No specialty comments available.    Procedures:  No procedures performed Allergies: Sulfonamide derivatives   Assessment / Plan:     Visit Diagnoses: Other systemic lupus erythematosus with other organ involvement ([facility]) - dsDNA +64, Smith + 5.5, RNP+ 6.5, SSA([place]) Negative, SSB ([place]) Negative, patient presented initially with nephritis, hair loss, arthralgias, dermatitis.  She has no active dermatitis today.  Her arthralgias are better.  She had  workup by her nephrologist which was negative per patient.  High risk medication use - Plaquenil 200mg  po bid.eye exam: 09/12/2016.  Her labs are stable.  We will get labs again in 3 months with other autoimmune labs.  Lupus nephritis ([place]) 1999-2000  - 1999-2000 treated with Cytoxan IV for total for dosage and prednisone for one year.  She had recent workup by nephrologist according to patient the workup is negative I do not have records available.  She will have the records forwarded to [place].  History of proteinuria   History of renal insufficiency - GFR 40-50  Creat 1.15-1.35 range  Dermatitis - face and scalp. Resolved after using topical agents.  DDD (degenerative disc disease), lumbar - She also has sciatica and has been followed by chiropractor.  Osteopenia of multiple sites - She is on calcium and  vitamin D 1000 units every day. I would like her to maintain her vitamin D between 40 and 50.  History of diverticulosis  History of hyperlipidemia  History of adenomatous  polyp of colon  History of anemia  History of prediabetes    Orders: No orders of the defined types were placed in this encounter.  No orders of the defined types were placed in this encounter.   Face-to-face time spent with patient was 30 minutes.  Greater than 50% of time was spent in counseling and coordination of care.  Follow-Up Instructions: Return in about 5 months (around 10/19/2017) for Systemic lupus.   Bo Merino, MD  Note - This record has been created using Editor, commissioning.  Chart creation errors have been sought, but may not always  have been located. Such creation errors do not reflect on  the standard of medical care.

## 2017-05-22 ENCOUNTER — Ambulatory Visit: Payer: BC Managed Care – PPO | Admitting: Rheumatology

## 2017-05-22 ENCOUNTER — Encounter: Payer: Self-pay | Admitting: Rheumatology

## 2017-05-22 VITALS — BP 117/76 | HR 66 | Resp 16 | Ht 62.0 in | Wt 141.0 lb

## 2017-05-22 DIAGNOSIS — L309 Dermatitis, unspecified: Secondary | ICD-10-CM

## 2017-05-22 DIAGNOSIS — Z8719 Personal history of other diseases of the digestive system: Secondary | ICD-10-CM | POA: Diagnosis not present

## 2017-05-22 DIAGNOSIS — Z87898 Personal history of other specified conditions: Secondary | ICD-10-CM

## 2017-05-22 DIAGNOSIS — M8589 Other specified disorders of bone density and structure, multiple sites: Secondary | ICD-10-CM

## 2017-05-22 DIAGNOSIS — M3214 Glomerular disease in systemic lupus erythematosus: Secondary | ICD-10-CM | POA: Diagnosis not present

## 2017-05-22 DIAGNOSIS — Z87448 Personal history of other diseases of urinary system: Secondary | ICD-10-CM | POA: Diagnosis not present

## 2017-05-22 DIAGNOSIS — Z79899 Other long term (current) drug therapy: Secondary | ICD-10-CM | POA: Diagnosis not present

## 2017-05-22 DIAGNOSIS — Z862 Personal history of diseases of the blood and blood-forming organs and certain disorders involving the immune mechanism: Secondary | ICD-10-CM | POA: Diagnosis not present

## 2017-05-22 DIAGNOSIS — Z8639 Personal history of other endocrine, nutritional and metabolic disease: Secondary | ICD-10-CM

## 2017-05-22 DIAGNOSIS — M3219 Other organ or system involvement in systemic lupus erythematosus: Secondary | ICD-10-CM | POA: Diagnosis not present

## 2017-05-22 DIAGNOSIS — Z86010 Personal history of colonic polyps: Secondary | ICD-10-CM

## 2017-05-22 DIAGNOSIS — M5136 Other intervertebral disc degeneration, lumbar region: Secondary | ICD-10-CM

## 2017-05-22 NOTE — Patient Instructions (Signed)
Standing Labs We placed an order today for your standing lab work.    Please come back and get your standing labs in April and every 3 months. Please get following labs with next labs C3-C4, dsDNA, UA, ESR, vitamin D  We have open lab Monday through Friday from 8:30-11:30 AM and 1:30-4 PM at the office of Dr. Bo Merino.   The office is located at [address], [place], [place], [place] [address] No appointment is necessary.   Labs are drawn by [facility].  You may receive a bill from [facility] for your lab work. If you have any questions regarding directions or hours of operation,  please call [phone].

## 2017-05-31 ENCOUNTER — Other Ambulatory Visit: Payer: Self-pay | Admitting: Rheumatology

## 2017-05-31 NOTE — Telephone Encounter (Signed)
Last Visit: 05/22/17 Next Visit: 10/30/17 Labs: 12/20/16 stable PLQ Eye Exam: 09/12/16 WNL  Okay to refill per Dr. Estanislado Pandy

## 2017-09-25 ENCOUNTER — Encounter: Payer: Self-pay | Admitting: Family Medicine

## 2017-09-25 ENCOUNTER — Ambulatory Visit (INDEPENDENT_AMBULATORY_CARE_PROVIDER_SITE_OTHER): Payer: Medicare Other | Admitting: Family Medicine

## 2017-09-25 VITALS — BP 118/64 | HR 84 | Resp 14 | Ht 62.0 in | Wt 135.8 lb

## 2017-09-25 DIAGNOSIS — Z23 Encounter for immunization: Secondary | ICD-10-CM

## 2017-09-25 DIAGNOSIS — Z1211 Encounter for screening for malignant neoplasm of colon: Secondary | ICD-10-CM

## 2017-09-25 DIAGNOSIS — Z0000 Encounter for general adult medical examination without abnormal findings: Secondary | ICD-10-CM

## 2017-09-25 DIAGNOSIS — D126 Benign neoplasm of colon, unspecified: Secondary | ICD-10-CM

## 2017-09-25 NOTE — Patient Instructions (Signed)
Wellness with nurse in September please pt to have flu vaccine at that visit  [place] with MD in 12 months and 1 day,. Please call if you  need me before   Pt to have prevnar at visit today  Excellent exam and h lifestyle  Pleas return 3 stool cards appropriately collected  Condolence and prayers on your recent loss  It is important that you exercise regularly at least 30 minutes 5 times a week. If you develop chest pain, have severe difficulty breathing, or feel very tired, stop exercising immediately and seek medical attention    Thank you  for choosing [facility]. We consider it a privelige to serve you.  Delivering excellent health care in a caring and  compassionate way is our goal.  Partnering with you,  so that together we can achieve this goal is our strategy.

## 2017-09-28 ENCOUNTER — Encounter: Payer: Self-pay | Admitting: Family Medicine

## 2017-09-28 NOTE — Assessment & Plan Note (Signed)
Pt to return 3 stool cards, will need  rept colonoscopy in 2020

## 2017-09-28 NOTE — Assessment & Plan Note (Signed)
After obtaining informed consent, the vaccine is  administered by LPN.  

## 2017-09-28 NOTE — Assessment & Plan Note (Signed)
Annual exam as documented. Counseling done  re healthy lifestyle involving commitment to 150 minutes exercise per week, heart healthy diet, and attaining healthy weight.The importance of adequate sleep also discussed.  Immunization and cancer screening needs are specifically addressed at this visit.  

## 2017-09-28 NOTE — Progress Notes (Signed)
    [name]     MRN: [id number]      DOB: Aug 14, 1950  HPI: Patient is in for annual physical exam. No other health concerns are expressed or addressed at the visit. Labs need to be updated Immunization is reviewed , and  Is updated   PE: BP 118/64 (BP Location: Left Arm, Patient Position: Sitting, Cuff Size: Normal)   Pulse 84   Resp 14   Ht 5\' 2"  (1.575 m)   Wt 135 lb 12 oz (61.6 kg)   SpO2 98%   BMI 24.83 kg/m   Pleasant  female, alert and oriented x 3, in no cardio-pulmonary distress. Afebrile. HEENT No facial trauma or asymetry. Sinuses non tender.  Extra occullar muscles intact External ears normal, tympanic membranes clear. Oropharynx moist, no exudate. Neck: supple, no adenopathy,JVD or thyromegaly.No bruits.  Chest: Clear to ascultation bilaterally.No crackles or wheezes. Non tender to palpation  Breast: No asymetry,no masses or lumps. No tenderness. No nipple discharge or inversion. No axillary or supraclavicular adenopathy  Cardiovascular system; Heart sounds normal,  S1 and  S2 ,no S3.  No murmur, or thrill. Apical beat not displaced Peripheral pulses normal.  Abdomen: Soft, non tender, no organomegaly or masses. No bruits. Bowel sounds normal. No guarding, tenderness or rebound.    GU: Asymptomatic and pap not due    Musculoskeletal exam: Full ROM of spine, hips , shoulders and knees. No deformity ,swelling or crepitus noted. No muscle wasting or atrophy.   Neurologic: Cranial nerves 2 to 12 intact. Power, tone ,sensation and reflexes normal throughout. No disturbance in gait. No tremor.  Skin: Intact, no ulceration, erythema , scaling or rash noted. Pigmentation normal throughout  Psych; Normal mood and affect. Judgement and concentration normal   Assessment & Plan:  Annual physical exam Annual exam as documented. Counseling done  re healthy lifestyle involving commitment to 150 minutes exercise per week, heart healthy  diet, and attaining healthy weight.The importance of adequate sleep also discussed. Immunization and cancer screening needs are specifically addressed at this visit.   Need for vaccination against Streptococcus pneumoniae using pneumococcal conjugate vaccine 13 After obtaining informed consent, the vaccine is  administered by LPN.   Tubular adenoma of colon Pt to return 3 stool cards, will need  rept colonoscopy in 2020

## 2017-10-16 ENCOUNTER — Other Ambulatory Visit: Payer: Self-pay | Admitting: *Deleted

## 2017-10-16 DIAGNOSIS — Z79899 Other long term (current) drug therapy: Secondary | ICD-10-CM

## 2017-10-16 DIAGNOSIS — Z1211 Encounter for screening for malignant neoplasm of colon: Secondary | ICD-10-CM

## 2017-10-16 DIAGNOSIS — M329 Systemic lupus erythematosus, unspecified: Secondary | ICD-10-CM

## 2017-10-16 DIAGNOSIS — Z87448 Personal history of other diseases of urinary system: Secondary | ICD-10-CM

## 2017-10-16 LAB — HEMOCCULT GUIAC POC 1CARD (OFFICE)
Card #2 Fecal Occult Blod, POC: NEGATIVE
Card #3 Fecal Occult Blood, POC: NEGATIVE
Fecal Occult Blood, POC: NEGATIVE

## 2017-10-17 NOTE — Progress Notes (Signed)
Office Visit Note  Patient: [name]             Date of Birth: June 15, 1950           MRN: [id number]             PCP: [name], MD Referring: [name], MD Visit Date: 10/30/2017 Occupation: _0 @  Subjective:  Medication management   History of Present Illness: [name] is a 67 y.o. female with history of systemic lupus and lupus nephritis.  She states she has been doing well without any joint pain or joint swelling.  She denies any recent rash or hair loss.  She has been followed by her nephrologist and according to patient her kidney functions are stable.  Being much discomfort in her lower back.  Activities of Daily Living:  Patient reports morning stiffness for 0 none.   Patient Denies nocturnal pain.  Difficulty dressing/grooming: Denies Difficulty climbing stairs: Denies Difficulty getting out of chair: Denies Difficulty using hands for taps, buttons, cutlery, and/or writing: Denies  Review of Systems  Constitutional: Negative for fatigue, night sweats, weight gain and weight loss.  HENT: Negative for mouth sores, trouble swallowing, trouble swallowing, mouth dryness and nose dryness.   Eyes: Positive for dryness. Negative for pain, redness and visual disturbance.  Respiratory: Negative for cough, shortness of breath and difficulty breathing.   Cardiovascular: Negative for chest pain, palpitations, hypertension, irregular heartbeat and swelling in legs/feet.  Gastrointestinal: Negative for blood in stool, constipation and diarrhea.  Endocrine: Negative for increased urination.  Genitourinary: Negative for difficulty urinating and vaginal dryness.  Musculoskeletal: Negative for arthralgias, gait problem, joint pain, joint swelling, myalgias, muscle weakness, morning stiffness, muscle tenderness and myalgias.  Skin: Negative for color change, rash, hair loss, skin tightness, ulcers and sensitivity to sunlight.  Allergic/Immunologic:  Negative for susceptible to infections.  Neurological: Negative for dizziness, memory loss, night sweats and weakness.  Hematological: Negative for bruising/bleeding tendency and swollen glands.  Psychiatric/Behavioral: Negative for depressed mood and sleep disturbance. The patient is not nervous/anxious.     PMFS History:  Patient Active Problem List   Diagnosis Date Noted  . Lupus nephritis ([place]) 1999-2000  09/15/2016  . High risk medication use 09/15/2016  . DDD (degenerative disc disease), lumbar 09/15/2016  . History of hyperlipidemia 09/15/2016  . History of anemia 09/15/2016  . History of proteinuria  09/15/2016  . Dermatitis of face 09/15/2016  . Hair loss 09/15/2016  . Tubular adenoma of colon 04/30/2016  . Annual physical exam 09/07/2015  . Prediabetes 11/15/2014  . Need for vaccination against Streptococcus pneumoniae using pneumococcal conjugate vaccine 13 01/10/2014  . Osteopenia 07/21/2013  . SLE (systemic lupus erythematosus) ([place]) 02/15/2012  . History of diverticulosis 10/01/2007    Past Medical History:  Diagnosis Date  . Arthritis 02/13/10   abnormal MRI  . SLE (systemic lupus erythematosus) ([place]) dx apprx 1990   reports immunotherapy, cytoxan and steroids at [facility], no f/u for over 63yr    Family History  Problem Relation Age of Onset  . Heart disease Father   . Cancer Brother [phone]      breast  . Cancer Sister 547      breast  . Colon cancer Neg Hx    Past Surgical History:  Procedure Laterality Date  . ABDOMINAL HYSTERECTOMY  unsure, over 10 yrs   partial, has had abnormal pap  . BLADDER REPAIR    . COLONOSCOPY N/A 01/05/2014  Procedure: COLONOSCOPY;  Surgeon: Daneil Dolin, MD;  Location[facility];  Service: Endoscopy;  Laterality: N/A;  12:15 PM   Social History   Social History Narrative  . Not on file    Objective: Vital Signs: BP 111/69 (BP Location: Left Arm, Patient Position: Sitting, Cuff Size: Normal)   Pulse 79   Resp 14    Ht _0  (1.575 m)   Wt 135 lb (61.2 kg)   BMI 24.69 kg/m    Physical Exam  Constitutional: She is oriented to person, place, and time. She appears well-developed and well-nourished.  HENT:  Head: Normocephalic and atraumatic.  Eyes: Conjunctivae and EOM are normal.  Neck: Normal range of motion.  Cardiovascular: Normal rate, regular rhythm, normal heart sounds and intact distal pulses.  Pulmonary/Chest: Effort normal and breath sounds normal.  Abdominal: Soft. Bowel sounds are normal.  Lymphadenopathy:    She has no cervical adenopathy.  Neurological: She is alert and oriented to person, place, and time.  Skin: Skin is warm and dry. Capillary refill takes less than 2 seconds.  Psychiatric: She has a normal mood and affect. Her behavior is normal.  Nursing note and vitals reviewed.    Musculoskeletal Exam: C-spine thoracic lumbar spine good range of motion.  Shoulder joints elbow joints wrist joint MCPs PIPs DIPs were in good range of motion with no synovitis.  Hip joints knee joints ankles MTPs PIPs DIPs are in good range of motion with no synovitis.  CDAI Exam: No CDAI exam completed.   Investigation: No additional findings.  Imaging: No results found.  Recent Labs: Lab Results  Component Value Date   WBC 4.0 10/17/2017   HGB 12.7 10/17/2017   PLT 160 10/17/2017   NA 137 10/17/2017   K 4.2 10/17/2017   CL 103 10/17/2017   CO2 28 10/17/2017   GLUCOSE 97 10/17/2017   BUN 19 10/17/2017   CREATININE 1.16 (H) 10/17/2017   BILITOT 0.3 10/17/2017   ALKPHOS 43 09/18/2016   AST 20 10/17/2017   ALT 17 10/17/2017   PROT 7.2 10/17/2017   ALBUMIN 3.8 09/18/2016   CALCIUM 9.5 10/17/2017   GFRAA 57 (L) 10/17/2017   UA negative, C3-C4 normal, ESR 9, double-stranded DNA 8, vitamin D 39 Speciality Comments: No specialty comments available.  Procedures:  No procedures performed Allergies: Sulfonamide derivatives   Assessment / Plan:     Visit Diagnoses: Other systemic  lupus erythematosus with other organ involvement ([place]) -history of dsDNA +64, Smith + 5.5, RNP+ 6.5, SSA([place]) Negative, SSB ([place]) Negative, patient presented initially with nephritis, hair loss, arthralgias, dermatitis.  Patient is clinically doing well without any joint swelling or rash.  High risk medication use - PLQ 200 mg p.o. twice daily.PLQ Eye Exam: 09/12/16.  Her eye exam is due.  Lupus nephritis ([place]) 1999-2000  - 1999-2000 treated with Cytoxan IV for total for dosage and prednisone for one year.  She has been followed by her nephrologist closely.  History of proteinuria syndrome-her recent UA was negative for proteinuria.  DDD (degenerative disc disease), lumbar - She also has sciatica and has been followed by chiropractor.  She had good flexibility in the lower spine today.  History of renal insufficiency - GFR 40-50  Creat 1.15-1.35 range.  Her GFR is a stable.  Dermatitis - face and scalp. Resolved after using topical agents.  Osteopenia of multiple sites-she is on calcium and vitamin D.  History of diverticulosis  History of adenomatous polyp of colon  History of prediabetes  History of anemia  History of hyperlipidemia   Orders: Orders Placed This Encounter  Procedures  . CBC with Differential/Platelet  . COMPLETE METABOLIC PANEL WITH GFR  . Urinalysis, Routine w reflex microscopic  . ANA  . Anti-DNA antibody, double-stranded  . C3 and C4  . Sedimentation rate  . VITAMIN D 25 Hydroxy (Vit-D Deficiency, Fractures)   No orders of the defined types were placed in this encounter.   Face-to-face time spent with patient was 30 minutes. Greater than 50% of time was spent in counseling and coordination of care.  Follow-Up Instructions: Return in about 5 months (around 04/01/2018) for Systemic lupus, DDD.   Bo Merino, MD  Note - This record has been created using Editor, commissioning.  Chart creation errors have been sought, but may not always  have been located.  Such creation errors do not reflect on  the standard of medical care.

## 2017-10-18 LAB — CBC WITH DIFFERENTIAL/PLATELET
Basophils Absolute: 52 cells/uL (ref 0–200)
Basophils Relative: 1.3 %
Eosinophils Absolute: 60 cells/uL (ref 15–500)
Eosinophils Relative: 1.5 %
HCT: 39.1 % (ref 35.0–45.0)
Hemoglobin: 12.7 g/dL (ref 11.7–15.5)
Lymphs Abs: 1360 cells/uL (ref 850–3900)
MCH: 27.5 pg (ref 27.0–33.0)
MCHC: 32.5 g/dL (ref 32.0–36.0)
MCV: 84.8 fL (ref 80.0–100.0)
MPV: 13.1 fL — ABNORMAL HIGH (ref 7.5–12.5)
Monocytes Relative: 12.8 %
Neutro Abs: 2016 cells/uL (ref 1500–7800)
Neutrophils Relative %: 50.4 %
Platelets: 160 10*3/uL (ref 140–400)
RBC: 4.61 10*6/uL (ref 3.80–5.10)
RDW: 13.6 % (ref 11.0–15.0)
Total Lymphocyte: 34 %
WBC mixed population: 512 cells/uL (ref 200–950)
WBC: 4 10*3/uL (ref 3.8–10.8)

## 2017-10-18 LAB — COMPLETE METABOLIC PANEL WITH GFR
AG Ratio: 1.4 (calc) (ref 1.0–2.5)
ALT: 17 U/L (ref 6–29)
AST: 20 U/L (ref 10–35)
Albumin: 4.2 g/dL (ref 3.6–5.1)
Alkaline phosphatase (APISO): 47 U/L (ref 33–130)
BUN/Creatinine Ratio: 16 (calc) (ref 6–22)
BUN: 19 mg/dL (ref 7–25)
CO2: 28 mmol/L (ref 20–32)
Calcium: 9.5 mg/dL (ref 8.6–10.4)
Chloride: 103 mmol/L (ref 98–110)
Creat: 1.16 mg/dL — ABNORMAL HIGH (ref 0.50–0.99)
GFR, Est African American: 57 mL/min/{1.73_m2} — ABNORMAL LOW (ref >=60)
GFR, Est Non African American: 49 mL/min/{1.73_m2} — ABNORMAL LOW (ref >=60)
Globulin: 3 g/dL (calc) (ref 1.9–3.7)
Glucose, Bld: 97 mg/dL (ref 65–139)
Potassium: 4.2 mmol/L (ref 3.5–5.3)
Sodium: 137 mmol/L (ref 135–146)
Total Bilirubin: 0.3 mg/dL (ref 0.2–1.2)
Total Protein: 7.2 g/dL (ref 6.1–8.1)

## 2017-10-18 LAB — URINALYSIS, ROUTINE W REFLEX MICROSCOPIC
Bacteria, UA: NONE SEEN /HPF
Bilirubin Urine: NEGATIVE
Glucose, UA: NEGATIVE
Hgb urine dipstick: NEGATIVE
Hyaline Cast: NONE SEEN /LPF
Ketones, ur: NEGATIVE
Nitrite: NEGATIVE
Protein, ur: NEGATIVE
RBC / HPF: NONE SEEN /HPF (ref 0–2)
Specific Gravity, Urine: 1.005 (ref 1.001–1.03)
Squamous Epithelial / LPF: NONE SEEN /HPF (ref <=5)
WBC, UA: NONE SEEN /HPF (ref 0–5)
pH: 6.5 (ref 5.0–8.0)

## 2017-10-18 LAB — SEDIMENTATION RATE: Sed Rate: 9 mm/h (ref 0–30)

## 2017-10-18 LAB — C3 AND C4
C3 Complement: 117 mg/dL (ref 83–193)
C4 Complement: 22 mg/dL (ref 15–57)

## 2017-10-18 LAB — VITAMIN D 25 HYDROXY (VIT D DEFICIENCY, FRACTURES): Vit D, 25-Hydroxy: 39 ng/mL (ref 30–100)

## 2017-10-18 LAB — ANTI-DNA ANTIBODY, DOUBLE-STRANDED: ds DNA Ab: 8 IU/mL — ABNORMAL HIGH

## 2017-10-18 NOTE — Progress Notes (Signed)
Labs are stable.

## 2017-10-23 ENCOUNTER — Telehealth: Payer: Self-pay | Admitting: Rheumatology

## 2017-10-23 NOTE — Telephone Encounter (Signed)
Patient advised dx code has been added at the lab.

## 2017-10-23 NOTE — Telephone Encounter (Signed)
Patient states [facility] in [place] told patient to request a new DX for Vit D level. Insurance will not pay without correlating dx code. Please advise lab.

## 2017-10-24 ENCOUNTER — Other Ambulatory Visit: Payer: Self-pay | Admitting: Rheumatology

## 2017-10-24 NOTE — Telephone Encounter (Signed)
Last visit: 05/22/2017 Next visit: 10/30/2017 Labs: 10/17/2017 stable  Eye exam: 09/12/2016  Advised patient she is due to update PLQ eye exam. Patient states she will get one scheduled and will get an eye exam form from [place] next week when she is here for an appointment.   Okay to refill per Dr. Estanislado Pandy.

## 2017-10-30 ENCOUNTER — Encounter: Payer: Self-pay | Admitting: Rheumatology

## 2017-10-30 ENCOUNTER — Ambulatory Visit: Payer: BC Managed Care – PPO | Admitting: Rheumatology

## 2017-10-30 VITALS — BP 111/69 | HR 79 | Resp 14 | Ht 62.0 in | Wt 135.0 lb

## 2017-10-30 DIAGNOSIS — M3219 Other organ or system involvement in systemic lupus erythematosus: Secondary | ICD-10-CM | POA: Diagnosis not present

## 2017-10-30 DIAGNOSIS — Z87448 Personal history of other diseases of urinary system: Secondary | ICD-10-CM

## 2017-10-30 DIAGNOSIS — Z8639 Personal history of other endocrine, nutritional and metabolic disease: Secondary | ICD-10-CM

## 2017-10-30 DIAGNOSIS — Z79899 Other long term (current) drug therapy: Secondary | ICD-10-CM

## 2017-10-30 DIAGNOSIS — M3214 Glomerular disease in systemic lupus erythematosus: Secondary | ICD-10-CM

## 2017-10-30 DIAGNOSIS — Z860101 Personal history of adenomatous and serrated colon polyps: Secondary | ICD-10-CM

## 2017-10-30 DIAGNOSIS — Z87898 Personal history of other specified conditions: Secondary | ICD-10-CM

## 2017-10-30 DIAGNOSIS — M51369 Other intervertebral disc degeneration, lumbar region without mention of lumbar back pain or lower extremity pain: Secondary | ICD-10-CM

## 2017-10-30 DIAGNOSIS — Z862 Personal history of diseases of the blood and blood-forming organs and certain disorders involving the immune mechanism: Secondary | ICD-10-CM

## 2017-10-30 DIAGNOSIS — M5136 Other intervertebral disc degeneration, lumbar region: Secondary | ICD-10-CM

## 2017-10-30 DIAGNOSIS — Z8719 Personal history of other diseases of the digestive system: Secondary | ICD-10-CM

## 2017-10-30 DIAGNOSIS — M8589 Other specified disorders of bone density and structure, multiple sites: Secondary | ICD-10-CM

## 2017-10-30 DIAGNOSIS — L309 Dermatitis, unspecified: Secondary | ICD-10-CM

## 2017-10-30 DIAGNOSIS — Z86010 Personal history of colonic polyps: Secondary | ICD-10-CM

## 2017-12-10 ENCOUNTER — Ambulatory Visit (INDEPENDENT_AMBULATORY_CARE_PROVIDER_SITE_OTHER): Payer: Medicare Other | Admitting: Family Medicine

## 2017-12-10 ENCOUNTER — Other Ambulatory Visit: Payer: Self-pay

## 2017-12-10 ENCOUNTER — Encounter: Payer: Self-pay | Admitting: Family Medicine

## 2017-12-10 VITALS — BP 122/82 | HR 79 | Resp 16 | Ht 62.0 in | Wt 134.0 lb

## 2017-12-10 DIAGNOSIS — Z90710 Acquired absence of both cervix and uterus: Secondary | ICD-10-CM

## 2017-12-10 DIAGNOSIS — Z0000 Encounter for general adult medical examination without abnormal findings: Secondary | ICD-10-CM

## 2017-12-10 DIAGNOSIS — Z23 Encounter for immunization: Secondary | ICD-10-CM

## 2017-12-10 DIAGNOSIS — Z1231 Encounter for screening mammogram for malignant neoplasm of breast: Secondary | ICD-10-CM

## 2017-12-10 DIAGNOSIS — E8940 Asymptomatic postprocedural ovarian failure: Secondary | ICD-10-CM

## 2017-12-10 DIAGNOSIS — Z8639 Personal history of other endocrine, nutritional and metabolic disease: Secondary | ICD-10-CM

## 2017-12-10 LAB — POCT URINALYSIS DIPSTICK
Bilirubin, UA: NEGATIVE
Glucose, UA: NEGATIVE
Ketones, UA: NEGATIVE
Leukocytes, UA: NEGATIVE
Nitrite, UA: NEGATIVE
Protein, UA: NEGATIVE
Spec Grav, UA: 1.01 (ref 1.010–1.025)
Urobilinogen, UA: 0.2 E.U./dL
pH, UA: 6 (ref 5.0–8.0)

## 2017-12-10 NOTE — Patient Instructions (Addendum)
F/U in 5.5 month, call if you need me before  Flu vaccine today   Mammogram and bone density need to be scheduled ,please do so at checkout if possible  Keep all healthy habits that you are doing   Fasting lipid, chem 7 and EGFr In 5 months ( [facility])   Fall Prevention in the Home Falls can cause injuries and can affect people from all age groups. There are many simple things that you can do to make your home safe and to help prevent falls. What can I do on the outside of my home?  Regularly repair the edges of walkways and driveways and fix any cracks.  Remove high doorway thresholds.  Trim any shrubbery on the main path into your home.  Use bright outdoor lighting.  Clear walkways of debris and clutter, including tools and rocks.  Regularly check that handrails are securely fastened and in good repair. Both sides of any steps should have handrails.  Install guardrails along the edges of any raised decks or porches.  Have leaves, snow, and ice cleared regularly.  Use sand or salt on walkways during winter months.  In the garage, clean up any spills right away, including grease or oil spills. What can I do in the bathroom?  Use night lights.  Install grab bars by the toilet and in the tub and shower. Do not use towel bars as grab bars.  Use non-skid mats or decals on the floor of the tub or shower.  If you need to sit down while you are in the shower, use a plastic, non-slip stool.  Keep the floor dry. Immediately clean up any water that spills on the floor.  Remove soap buildup in the tub or shower on a regular basis.  Attach bath mats securely with double-sided non-slip rug tape.  Remove throw rugs and other tripping hazards from the floor. What can I do in the bedroom?  Use night lights.  Make sure that a bedside light is easy to reach.  Do not use oversized bedding that drapes onto the floor.  Have a firm chair that has side arms to use for getting  dressed.  Remove throw rugs and other tripping hazards from the floor. What can I do in the kitchen?  Clean up any spills right away.  Avoid walking on wet floors.  Place frequently used items in easy-to-reach places.  If you need to reach for something above you, use a sturdy step stool that has a grab bar.  Keep electrical cables out of the way.  Do not use floor polish or wax that makes floors slippery. If you have to use wax, make sure that it is non-skid floor wax.  Remove throw rugs and other tripping hazards from the floor. What can I do in the stairways?  Do not leave any items on the stairs.  Make sure that there are handrails on both sides of the stairs. Fix handrails that are broken or loose. Make sure that handrails are as long as the stairways.  Check any carpeting to make sure that it is firmly attached to the stairs. Fix any carpet that is loose or worn.  Avoid having throw rugs at the top or bottom of stairways, or secure the rugs with carpet tape to prevent them from moving.  Make sure that you have a light switch at the top of the stairs and the bottom of the stairs. If you do not have them, have them installed. What  are some other fall prevention tips?  Wear closed-toe shoes that fit well and support your feet. Wear shoes that have rubber soles or low heels.  When you use a stepladder, make sure that it is completely opened and that the sides are firmly locked. Have someone hold the ladder while you are using it. Do not climb a closed stepladder.  Add color or contrast paint or tape to grab bars and handrails in your home. Place contrasting color strips on the first and last steps.  Use mobility aids as needed, such as canes, walkers, scooters, and crutches.  Turn on lights if it is dark. Replace any light bulbs that burn out.  Set up furniture so that there are clear paths. Keep the furniture in the same spot.  Fix any uneven floor surfaces.  Choose a  carpet design that does not hide the edge of steps of a stairway.  Be aware of any and all pets.  Review your medicines with your healthcare provider. Some medicines can cause dizziness or changes in blood pressure, which increase your risk of falling. Talk with your health care provider about other ways that you can decrease your risk of falls. This may include working with a physical therapist or trainer to improve your strength, balance, and endurance. This information is not intended to replace advice given to you by your health care provider. Make sure you discuss any questions you have with your health care provider. Document Released: 03/03/2002 Document Revised: 08/10/2015 Document Reviewed: 04/17/2014 Elsevier Interactive Patient Education  [employer].

## 2017-12-10 NOTE — Assessment & Plan Note (Addendum)
Annual exam as documented. Counseling done  re healthy lifestyle involving commitment to 150 minutes exercise per week, heart healthy diet, and attaining healthy weight.The importance of adequate sleep also discussed. Regular seat belt use and home safety, is also discussed. . Immunization and cancer screening needs are specifically addressed at this visit. Normal EKG  And CCUA

## 2017-12-10 NOTE — Progress Notes (Signed)
Preventive Screening-Counseling & Management   Patient present here today for a Medicare annual wellness visit.   Current Problems (verified)   Medications Prior to Visit Allergies (verified)   PAST HISTORY  Family History  Social History : married x 35 years, retired from   From school system admin  at age 67, ; 3 living children all healthy ages 17 to 30, 2 sons and 1 daughter   Risk Factors  Current exercise habits:  Encouraged to exercise for at least 30 minutes daily and 150 minutes weekly.  Dietary issues discussed: Encouraged to eat a heart healthy diet   Cardiac risk factors: Prediabetes, hyperlipidemia Breast cancer in  [place] and pt had tubular  Adenoma in 2015, needs rept in 7 years  Depression Screen  (Note: if answer to either of the following is "Yes", a more complete depression screening is indicated)   Over the past two weeks, have you felt down, depressed or hopeless? No  Over the past two weeks, have you felt little interest or pleasure in doing things? No  Have you lost interest or pleasure in daily life? No  Do you often feel hopeless? No  Do you cry easily over simple problems? No   Activities of Daily Living  In your present state of health, do you have any difficulty performing the following activities?  Driving?: No Managing money?: No Feeding yourself?:No Getting from bed to chair?:No Climbing a flight of stairs?:No Preparing food and eating?:No Bathing or showering?:No Getting dressed?:No Getting to the toilet?:No Using the toilet?:No Moving around from place to place?: No  Fall Risk Assessment In the past year have you fallen or had a near fall?:No Are you currently taking any medications that make you dizzy?:No   Hearing Difficulties: No Do you often ask people to speak up or repeat themselves?:No Do you experience ringing or noises in your ears?:No Do you have difficulty understanding soft or whispered voices?:No  Cognitive  Testing  Alert? Yes Normal Appearance?Yes  Oriented to person? Yes Place? Yes  Time? Yes  Displays appropriate judgment?Yes  Can read the correct time from a watch face? yes Are you having problems remembering things?No  Advanced Directives have been discussed with the patient?Yes    List the Names of Other Physician/Practitioners you currently use: Dr Estanislado Pandy   Indicate any recent Medical Services you may have received from other than [facility] providers in the past year (date may be approximate).     Medicare Attestation  I have personally reviewed:  The patient's medical and social history  Their use of alcohol, tobacco or illicit drugs  Their current medications and supplements  The patient's functional ability including ADLs,fall risks, home safety risks, cognitive, and hearing and visual impairment  Diet and physical activities  Evidence for depression or mood disorders  The patient's weight, height, BMI, and visual acuity have been recorded in the chart. I have made referrals, counseling, and provided education to the patient based on review of the above and I have provided the patient with a written personalized care plan for preventive services.    Physical Exam BP 122/82   Pulse 79   Resp 16   Ht 5\' 2"  (1.575 m)   Wt 134 lb (60.8 kg)   SpO2 97%   BMI 24.51 kg/m  EKG: NSR, no LVH, regular rate, no ischemia CCUA: normal  Assessment & Plan:  Welcome to Medicare preventive visit Annual exam as documented. Counseling done  re healthy lifestyle involving commitment to  150 minutes exercise per week, heart healthy diet, and attaining healthy weight.The importance of adequate sleep also discussed. Regular seat belt use and home safety, is also discussed. . Immunization and cancer screening needs are specifically addressed at this visit. Normal EKG  And CCUA

## 2018-01-09 ENCOUNTER — Encounter: Payer: Self-pay | Admitting: Family Medicine

## 2018-01-09 ENCOUNTER — Other Ambulatory Visit: Payer: Self-pay | Admitting: Family Medicine

## 2018-01-09 ENCOUNTER — Ambulatory Visit (HOSPITAL_COMMUNITY)
Admission: RE | Admit: 2018-01-09 | Discharge: 2018-01-09 | Disposition: A | Discharge status: Home / Self Care | Payer: Medicare Other | Source: Ambulatory Visit | Attending: Family Medicine | Admitting: Family Medicine

## 2018-01-09 DIAGNOSIS — Z1231 Encounter for screening mammogram for malignant neoplasm of breast: Secondary | ICD-10-CM | POA: Diagnosis not present

## 2018-01-09 DIAGNOSIS — E8940 Asymptomatic postprocedural ovarian failure: Secondary | ICD-10-CM | POA: Diagnosis not present

## 2018-01-09 DIAGNOSIS — Z90710 Acquired absence of both cervix and uterus: Secondary | ICD-10-CM | POA: Insufficient documentation

## 2018-01-09 MED ORDER — ALENDRONATE SODIUM 70 MG PO TABS: 70.0000 mg | ORAL_TABLET | ORAL | 3 refills | Status: DC

## 2018-01-18 ENCOUNTER — Telehealth: Payer: Self-pay | Admitting: Family Medicine

## 2018-01-18 NOTE — Telephone Encounter (Signed)
Pt is returning your call

## 2018-01-18 NOTE — Telephone Encounter (Signed)
Returned patients call. No answer left generic message requesting call back.

## 2018-01-23 NOTE — Telephone Encounter (Signed)
Left generic message requesting call back to discuss dexa scan results. Letter has also been sent to patient requesting she get in touch with office to discuss results.

## 2018-01-24 NOTE — Telephone Encounter (Signed)
Patient called back and results of DEXA scan was discussed, advised her of the medication that Dr. Moshe Cipro has called in for her.

## 2018-03-14 ENCOUNTER — Other Ambulatory Visit: Payer: Self-pay | Admitting: Rheumatology

## 2018-03-14 NOTE — Telephone Encounter (Signed)
Last visit: 10/30/17 Next Visit: 04/04/18 Labs: 10/17/17 stable   Okay to refill per Dr. Estanislado Pandy

## 2018-03-22 NOTE — Progress Notes (Deleted)
Office Visit Note  Patient: [name]             Date of Birth: 1950/08/29           MRN: [id number]             PCP: [name], MD Referring: [name], MD Visit Date: 04/04/2018 Occupation: @GUAROCC @  Subjective:  No chief complaint on file.   History of Present Illness: [name] is a 67 y.o. female ***   Activities of Daily Living:  Patient reports morning stiffness for *** {minute/hour:19697}.   Patient {ACTIONS;DENIES/REPORTS:21021675::"Denies"} nocturnal pain.  Difficulty dressing/grooming: {ACTIONS;DENIES/REPORTS:21021675::"Denies"} Difficulty climbing stairs: {ACTIONS;DENIES/REPORTS:21021675::"Denies"} Difficulty getting out of chair: {ACTIONS;DENIES/REPORTS:21021675::"Denies"} Difficulty using hands for taps, buttons, cutlery, and/or writing: {ACTIONS;DENIES/REPORTS:21021675::"Denies"}  No Rheumatology ROS completed.   PMFS History:  Patient Active Problem List   Diagnosis Date Noted  . Lupus nephritis ([place]) 1999-2000  09/15/2016  . High risk medication use 09/15/2016  . DDD (degenerative disc disease), lumbar 09/15/2016  . History of hyperlipidemia 09/15/2016  . History of anemia 09/15/2016  . History of proteinuria  09/15/2016  . Dermatitis of face 09/15/2016  . Hair loss 09/15/2016  . Tubular adenoma of colon 04/30/2016  . Welcome to Medicare preventive visit 09/07/2015  . Prediabetes 11/15/2014  . Need for vaccination against Streptococcus pneumoniae using pneumococcal conjugate vaccine 13 01/10/2014  . Osteopenia 07/21/2013  . SLE (systemic lupus erythematosus) ([place]) 02/15/2012  . History of diverticulosis 10/01/2007    Past Medical History:  Diagnosis Date  . Arthritis 02/13/10   abnormal MRI lumbar and c spine  with disc disease  . SLE (systemic lupus erythematosus) ([place]) dx apprx 1990   reports immunotherapy, cytoxan and steroids at [facility], no f/u for over 29yrs    Family History  Problem Relation Age of Onset  .  Heart disease Father   . Cancer Brother 41       breast  . Cancer Sister 7       breast  . Colon cancer Neg Hx    Past Surgical History:  Procedure Laterality Date  . ABDOMINAL HYSTERECTOMY  unsure, over 10 yrs   partial, has had abnormal pap  . BLADDER REPAIR    . COLONOSCOPY N/A 01/05/2014   Procedure: COLONOSCOPY;  Surgeon: Daneil Dolin, MD;  [facility];  Service: Endoscopy;  Laterality: N/A;  12:15 PM   Social History   Social History Narrative  . Not on file    Objective: Vital Signs: There were no vitals taken for this visit.   Physical Exam   Musculoskeletal Exam: ***  CDAI Exam: CDAI Score: Not documented Patient Global Assessment: Not documented; Provider Global Assessment: Not documented Swollen: Not documented; Tender: Not documented Joint Exam   Not documented   There is currently no information documented on the homunculus. Go to the Rheumatology activity and complete the homunculus joint exam.  Investigation: No additional findings.  Imaging: No results found.  Recent Labs: Lab Results  Component Value Date   WBC 4.0 10/17/2017   HGB 12.7 10/17/2017   PLT 160 10/17/2017   NA 137 10/17/2017   K 4.2 10/17/2017   CL 103 10/17/2017   CO2 28 10/17/2017   GLUCOSE 97 10/17/2017   BUN 19 10/17/2017   CREATININE 1.16 (H) 10/17/2017   BILITOT 0.3 10/17/2017   ALKPHOS 43 09/18/2016   AST 20 10/17/2017   ALT 17 10/17/2017   PROT 7.2 10/17/2017   ALBUMIN 3.8 09/18/2016  CALCIUM 9.5 10/17/2017   GFRAA 57 (L) 10/17/2017    Speciality Comments: No specialty comments available.  Procedures:  No procedures performed Allergies: Sulfonamide derivatives   Assessment / Plan:     Visit Diagnoses: No diagnosis found.   Orders: No orders of the defined types were placed in this encounter.  No orders of the defined types were placed in this encounter.   Face-to-face time spent with patient was *** minutes. Greater than 50% of time  was spent in counseling and coordination of care.  Follow-Up Instructions: No follow-ups on file.   Earnestine Mealing, CMA  Note - This record has been created using Editor, commissioning.  Chart creation errors have been sought, but may not always  have been located. Such creation errors do not reflect on  the standard of medical care.

## 2018-04-04 ENCOUNTER — Ambulatory Visit: Payer: Medicare Other | Admitting: Rheumatology

## 2018-05-02 ENCOUNTER — Other Ambulatory Visit: Payer: Self-pay | Admitting: Rheumatology

## 2018-05-02 NOTE — Telephone Encounter (Signed)
ok 

## 2018-05-02 NOTE — Telephone Encounter (Signed)
Last visit: 10/30/17 Next Visit:due January 2020, message sent to the front to schedule.  Labs: 10/17/17 stable  PLQ Eye Exam: 09/12/16  Okay to refill 30 day supply PLQ?

## 2018-05-02 NOTE — Telephone Encounter (Signed)
Please schedule patient a follow up visit. Patient due January 2020. Thanks!

## 2018-05-02 NOTE — Telephone Encounter (Signed)
Spoke with patient who stated she will check her schedule and call back to schedule her follow-up appointment.

## 2018-05-06 ENCOUNTER — Telehealth: Payer: Self-pay | Admitting: Rheumatology

## 2018-05-06 NOTE — Telephone Encounter (Signed)
Patient called requesting a return call to let her know if she needs to have labwork before her appointment on 05/20/18.

## 2018-05-06 NOTE — Telephone Encounter (Signed)
Patient advised that she may wait until the day of her appointment to have her blood work done. Patient verbalized understanding.

## 2018-05-06 NOTE — Progress Notes (Signed)
Office Visit Note  Patient: [name]             Date of Birth: Feb 07, 1951           MRN: [id number]             PCP: [name], MD Referring: [name], MD Visit Date: 05/20/2018 Occupation: @GUAROCC @  Subjective:  Medication management..    History of Present Illness: [name] is a 68 y.o. female history of systemic lupus, lupus nephritis and degenerative disc disease.  She states overall she is doing well.  Her husband recently had leg amputation and she has been helping him which is causing some strain in her right hip.  She denies any joint swelling.  Activities of Daily Living:  Patient reports morning stiffness for 0 minutes.   Patient Denies nocturnal pain.  Difficulty dressing/grooming: Denies Difficulty climbing stairs: Denies Difficulty getting out of chair: Denies Difficulty using hands for taps, buttons, cutlery, and/or writing: Denies  Review of Systems  Constitutional: Positive for fatigue.  HENT: Negative for mouth sores, mouth dryness and nose dryness.   Eyes: Positive for dryness. Negative for itching.  Respiratory: Negative for shortness of breath and wheezing.   Cardiovascular: Negative for chest pain and hypertension.  Gastrointestinal: Negative for abdominal pain, constipation and diarrhea.  Endocrine: Negative for increased urination.  Genitourinary: Positive for pelvic pain. Negative for painful urination and nocturia.  Musculoskeletal: Positive for arthralgias and joint pain. Negative for joint swelling and morning stiffness.  Skin: Negative for rash.  Allergic/Immunologic: Negative for susceptible to infections.  Neurological: Negative for dizziness, light-headedness, headaches, memory loss and weakness.  Hematological: Negative for bruising/bleeding tendency.  Psychiatric/Behavioral: Negative for confusion. The patient is not nervous/anxious.     PMFS History:  Patient Active Problem List   Diagnosis Date Noted   . Lupus nephritis ([place]) 1999-2000  09/15/2016  . High risk medication use 09/15/2016  . DDD (degenerative disc disease), lumbar 09/15/2016  . History of hyperlipidemia 09/15/2016  . History of anemia 09/15/2016  . History of proteinuria  09/15/2016  . Dermatitis of face 09/15/2016  . Hair loss 09/15/2016  . Tubular adenoma of colon 04/30/2016  . Welcome to Medicare preventive visit 09/07/2015  . Prediabetes 11/15/2014  . Need for vaccination against Streptococcus pneumoniae using pneumococcal conjugate vaccine 13 01/10/2014  . Osteopenia 07/21/2013  . SLE (systemic lupus erythematosus) ([place]) 02/15/2012  . History of diverticulosis 10/01/2007    Past Medical History:  Diagnosis Date  . Arthritis 02/13/10   abnormal MRI lumbar and c spine  with disc disease  . SLE (systemic lupus erythematosus) ([place]) dx apprx 1990   reports immunotherapy, cytoxan and steroids at [facility], no f/u for over 32yr    Family History  Problem Relation Age of Onset  . Heart disease Father   . Cancer Brother 647      breast  . Cancer Sister [phone]      breast  . Colon cancer Neg Hx    Past Surgical History:  Procedure Laterality Date  . ABDOMINAL HYSTERECTOMY  unsure, over 10 yrs   partial, has had abnormal pap  . BLADDER REPAIR    . COLONOSCOPY N/A 01/05/2014   Procedure: COLONOSCOPY;  Surgeon: RDaneil Dolin MD;  [facility];  Service: Endoscopy;  Laterality: N/A;  12:15 PM   Social History   Social History Narrative  . Not on file   Immunization History  Administered Date(s)  Administered  . Influenza Split 02/14/2012  . Influenza Whole 12/28/2000  . Influenza,inj,Quad PF,6+ Mos 12/16/2012, 01/06/2014, 01/19/2016, 12/10/2017  . Pneumococcal Conjugate-13 09/25/2017  . Td 09/17/2003  . Tdap 01/06/2014  . Zoster 12/17/2012     Objective: Vital Signs: BP 119/72 (BP Location: Left Arm, Patient Position: Sitting, Cuff Size: Normal)   Pulse 77   Resp 12   Ht 5' 2"  (1.575 m)   Wt  133 lb (60.3 kg)   BMI 24.33 kg/m    Physical Exam Vitals signs and nursing note reviewed.  Constitutional:      Appearance: She is well-developed.  HENT:     Head: Normocephalic and atraumatic.  Eyes:     Conjunctiva/sclera: Conjunctivae normal.  Neck:     Musculoskeletal: Normal range of motion.  Cardiovascular:     Rate and Rhythm: Normal rate and regular rhythm.     Heart sounds: Normal heart sounds.  Pulmonary:     Effort: Pulmonary effort is normal.     Breath sounds: Normal breath sounds.  Abdominal:     General: Bowel sounds are normal.     Palpations: Abdomen is soft.  Lymphadenopathy:     Cervical: No cervical adenopathy.  Skin:    General: Skin is warm and dry.     Capillary Refill: Capillary refill takes less than 2 seconds.  Neurological:     Mental Status: She is alert and oriented to person, place, and time.  Psychiatric:        Behavior: Behavior normal.      Musculoskeletal Exam: C-spine thoracic and lumbar spine good range of motion.  She has some thoracic kyphosis.  Shoulder joints elbow joints wrist joint MCPs PIPs DIPs been good range of motion with no synovitis.  Hip joints knee joints ankles MTPs PIPs been good range of motion with no synovitis.  CDAI Exam: CDAI Score: Not documented Patient Global Assessment: Not documented; Provider Global Assessment: Not documented Swollen: Not documented; Tender: Not documented Joint Exam   Not documented   There is currently no information documented on the homunculus. Go to the Rheumatology activity and complete the homunculus joint exam.  Investigation: No additional findings.  Imaging: No results found.  Recent Labs: Lab Results  Component Value Date   WBC 4.1 05/17/2018   HGB 13.1 05/17/2018   PLT 173 05/17/2018   NA 138 05/17/2018   K 4.6 05/17/2018   CL 104 05/17/2018   CO2 27 05/17/2018   GLUCOSE 67 05/17/2018   BUN 21 05/17/2018   CREATININE 1.11 (H) 05/17/2018   BILITOT 0.3  05/17/2018   ALKPHOS 43 09/18/2016   AST 29 05/17/2018   ALT 22 05/17/2018   PROT 7.0 05/17/2018   ALBUMIN 3.8 09/18/2016   CALCIUM 9.7 05/17/2018   GFRAA 60 05/17/2018  UA negative, ESR 11, complements normal, vitamin D 47  Speciality Comments: Plaquenil eye exam normal on 09/12/2016 per my eye doctor in [place]  Procedures:  No procedures performed Allergies: Sulfonamide derivatives   Assessment / Plan:     Visit Diagnoses: Other systemic lupus erythematosus with other organ involvement ([place]) - history of dsDNA +64, Smith + 5.5, RNP+ 6.5, SSA([place]) Negative, SSB ([place]) Negative, patient presented initially with nephritis, hair loss, arthralgias, dermatitis.  Most of her labs are back except for ANA and double-stranded DNA.  She has been taking Plaquenil regular basis.  She denies any lupus flare.  High risk medication use - PLQ 200 mg p.o. twice daily.PLQ Eye Exam:  was in July 2019.  Orders for CBC/CMP today and will monitor every 5 months  Lupus nephritis ([place]) 1999-2000  - 1999-2000 treated with Cytoxan IV for total for dosage and prednisone for one year.  She has been followed by her nephrologist closely.  History of proteinuria syndrome-most recent UA did not show any proteinuria.  DDD (degenerative disc disease), lumbar  History of renal insufficiency-her GFR is in 60s and stable.   Dermatitis - face and scalp. Resolved after using topical agents.  History of diverticulosis  History of adenomatous polyp of colon  History of prediabetes  History of anemia  History of hyperlipidemia  Age-related osteoporosis without current pathological fracture -Per patient she was placed on Fosamax recently.  Orders: No orders of the defined types were placed in this encounter.  No orders of the defined types were placed in this encounter.    Follow-Up Instructions: Return in about 5 months (around 10/18/2018) for Systemic lupus.   Bo Merino, MD  Note - This record  has been created using Editor, commissioning.  Chart creation errors have been sought, but may not always  have been located. Such creation errors do not reflect on  the standard of medical care.

## 2018-05-17 ENCOUNTER — Other Ambulatory Visit: Payer: Self-pay

## 2018-05-17 DIAGNOSIS — Z79899 Other long term (current) drug therapy: Secondary | ICD-10-CM

## 2018-05-17 DIAGNOSIS — M8589 Other specified disorders of bone density and structure, multiple sites: Secondary | ICD-10-CM

## 2018-05-17 DIAGNOSIS — M3219 Other organ or system involvement in systemic lupus erythematosus: Secondary | ICD-10-CM

## 2018-05-20 ENCOUNTER — Encounter: Payer: Self-pay | Admitting: Rheumatology

## 2018-05-20 ENCOUNTER — Ambulatory Visit: Payer: Medicare Other | Admitting: Rheumatology

## 2018-05-20 VITALS — BP 119/72 | HR 77 | Resp 12 | Ht 62.0 in | Wt 133.0 lb

## 2018-05-20 DIAGNOSIS — Z86010 Personal history of colonic polyps: Secondary | ICD-10-CM

## 2018-05-20 DIAGNOSIS — M3219 Other organ or system involvement in systemic lupus erythematosus: Secondary | ICD-10-CM | POA: Diagnosis not present

## 2018-05-20 DIAGNOSIS — Z87448 Personal history of other diseases of urinary system: Secondary | ICD-10-CM | POA: Diagnosis not present

## 2018-05-20 DIAGNOSIS — Z79899 Other long term (current) drug therapy: Secondary | ICD-10-CM | POA: Diagnosis not present

## 2018-05-20 DIAGNOSIS — M5136 Other intervertebral disc degeneration, lumbar region: Secondary | ICD-10-CM

## 2018-05-20 DIAGNOSIS — L309 Dermatitis, unspecified: Secondary | ICD-10-CM

## 2018-05-20 DIAGNOSIS — Z862 Personal history of diseases of the blood and blood-forming organs and certain disorders involving the immune mechanism: Secondary | ICD-10-CM

## 2018-05-20 DIAGNOSIS — M8589 Other specified disorders of bone density and structure, multiple sites: Secondary | ICD-10-CM

## 2018-05-20 DIAGNOSIS — Z87898 Personal history of other specified conditions: Secondary | ICD-10-CM

## 2018-05-20 DIAGNOSIS — M3214 Glomerular disease in systemic lupus erythematosus: Secondary | ICD-10-CM

## 2018-05-20 DIAGNOSIS — Z8639 Personal history of other endocrine, nutritional and metabolic disease: Secondary | ICD-10-CM

## 2018-05-20 DIAGNOSIS — Z8719 Personal history of other diseases of the digestive system: Secondary | ICD-10-CM

## 2018-05-20 DIAGNOSIS — M810 Age-related osteoporosis without current pathological fracture: Secondary | ICD-10-CM

## 2018-05-21 LAB — URINALYSIS, ROUTINE W REFLEX MICROSCOPIC
Bilirubin Urine: NEGATIVE
Glucose, UA: NEGATIVE
Hgb urine dipstick: NEGATIVE
Ketones, ur: NEGATIVE
Leukocytes,Ua: NEGATIVE
Nitrite: NEGATIVE
Protein, ur: NEGATIVE
Specific Gravity, Urine: 1.005 (ref 1.001–1.03)
pH: 5.5 (ref 5.0–8.0)

## 2018-05-21 LAB — COMPLETE METABOLIC PANEL WITH GFR
AG Ratio: 1.4 (calc) (ref 1.0–2.5)
ALT: 22 U/L (ref 6–29)
AST: 29 U/L (ref 10–35)
Albumin: 4.1 g/dL (ref 3.6–5.1)
Alkaline phosphatase (APISO): 38 U/L (ref 37–153)
BUN/Creatinine Ratio: 19 (calc) (ref 6–22)
BUN: 21 mg/dL (ref 7–25)
CO2: 27 mmol/L (ref 20–32)
Calcium: 9.7 mg/dL (ref 8.6–10.4)
Chloride: 104 mmol/L (ref 98–110)
Creat: 1.11 mg/dL — ABNORMAL HIGH (ref 0.50–0.99)
GFR, Est African American: 60 mL/min/{1.73_m2} (ref >=60)
GFR, Est Non African American: 52 mL/min/{1.73_m2} — ABNORMAL LOW (ref >=60)
Globulin: 2.9 g/dL (calc) (ref 1.9–3.7)
Glucose, Bld: 67 mg/dL (ref 65–99)
Potassium: 4.6 mmol/L (ref 3.5–5.3)
Sodium: 138 mmol/L (ref 135–146)
Total Bilirubin: 0.3 mg/dL (ref 0.2–1.2)
Total Protein: 7 g/dL (ref 6.1–8.1)

## 2018-05-21 LAB — ANTI-NUCLEAR AB-TITER (ANA TITER)
ANA TITER: 1:40 {titer} — ABNORMAL HIGH
ANA Titer 1: 1:80 {titer} — ABNORMAL HIGH

## 2018-05-21 LAB — CBC WITH DIFFERENTIAL/PLATELET
Absolute Monocytes: 599 cells/uL (ref 200–950)
Basophils Absolute: 62 cells/uL (ref 0–200)
Basophils Relative: 1.5 %
Eosinophils Absolute: 78 cells/uL (ref 15–500)
Eosinophils Relative: 1.9 %
HCT: 39.4 % (ref 35.0–45.0)
Hemoglobin: 13.1 g/dL (ref 11.7–15.5)
Lymphs Abs: 1214 cells/uL (ref 850–3900)
MCH: 28.4 pg (ref 27.0–33.0)
MCHC: 33.2 g/dL (ref 32.0–36.0)
MCV: 85.3 fL (ref 80.0–100.0)
MPV: 13.4 fL — ABNORMAL HIGH (ref 7.5–12.5)
Monocytes Relative: 14.6 %
Neutro Abs: 2148 cells/uL (ref 1500–7800)
Neutrophils Relative %: 52.4 %
Platelets: 173 10*3/uL (ref 140–400)
RBC: 4.62 10*6/uL (ref 3.80–5.10)
RDW: 13.4 % (ref 11.0–15.0)
Total Lymphocyte: 29.6 %
WBC: 4.1 10*3/uL (ref 3.8–10.8)

## 2018-05-21 LAB — VITAMIN D 25 HYDROXY (VIT D DEFICIENCY, FRACTURES): Vit D, 25-Hydroxy: 47 ng/mL (ref 30–100)

## 2018-05-21 LAB — C3 AND C4
C3 Complement: 111 mg/dL (ref 83–193)
C4 Complement: 23 mg/dL (ref 15–57)

## 2018-05-21 LAB — SEDIMENTATION RATE: Sed Rate: 11 mm/h (ref 0–30)

## 2018-05-21 LAB — ANTI-DNA ANTIBODY, DOUBLE-STRANDED: ds DNA Ab: 10 IU/mL — ABNORMAL HIGH

## 2018-05-21 LAB — ANA: Anti Nuclear Antibody(ANA): POSITIVE — AB

## 2018-05-21 NOTE — Progress Notes (Signed)
Labs are stable.  Continue current treatment.

## 2018-06-14 ENCOUNTER — Other Ambulatory Visit: Payer: Self-pay | Admitting: Rheumatology

## 2018-06-14 NOTE — Telephone Encounter (Signed)
Last visit: 05/20/18 Next Visit: 10/22/18 Labs: 05/17/18 stable Plaquenil eye exam normal on July 2019  Okay to refill per Dr. Estanislado Pandy

## 2018-09-30 ENCOUNTER — Other Ambulatory Visit: Payer: Self-pay

## 2018-09-30 ENCOUNTER — Encounter: Payer: Self-pay | Admitting: Family Medicine

## 2018-09-30 ENCOUNTER — Ambulatory Visit (INDEPENDENT_AMBULATORY_CARE_PROVIDER_SITE_OTHER): Payer: Medicare Other | Admitting: Family Medicine

## 2018-09-30 VITALS — BP 130/82 | HR 76 | Temp 98.7°F | Ht 62.0 in | Wt 137.0 lb

## 2018-09-30 DIAGNOSIS — Z0000 Encounter for general adult medical examination without abnormal findings: Secondary | ICD-10-CM | POA: Diagnosis not present

## 2018-09-30 DIAGNOSIS — Z8639 Personal history of other endocrine, nutritional and metabolic disease: Secondary | ICD-10-CM

## 2018-09-30 DIAGNOSIS — Z23 Encounter for immunization: Secondary | ICD-10-CM | POA: Diagnosis not present

## 2018-09-30 DIAGNOSIS — E785 Hyperlipidemia, unspecified: Secondary | ICD-10-CM

## 2018-09-30 DIAGNOSIS — R7303 Prediabetes: Secondary | ICD-10-CM

## 2018-09-30 DIAGNOSIS — Z1231 Encounter for screening mammogram for malignant neoplasm of breast: Secondary | ICD-10-CM

## 2018-09-30 NOTE — Assessment & Plan Note (Signed)

## 2018-09-30 NOTE — Patient Instructions (Signed)
Annual physical exam with MD in 1 year, call if you need me sooner  Please schedule screening mammogram at  checkout  Pneumonia 23 vaccine today  Fasting lipid, TSH and HBA1c this week please   Continue healthy habits   Let us know before you get labs if you decide on additional tests we have discussed  Social distancing. Frequent hand washing with soap and water Keeping your hands off of your face. These 3 practices will help to keep both you and your community healthy during this time. Please practice them faithfully!  Thanks for choosing [facility], we consider it a privelige to serve you.

## 2018-09-30 NOTE — Progress Notes (Signed)
    [name]     MRN: [id number]      DOB: 04/29/1950  HPI: Patient is in for annual physical exam. Recent labs, are reviewed. Re educated and refuses HIV and Hep C screening Immunization is reviewed , and  Will check rheumatology re safety of shingles vaccine  PE: BP 130/82 (BP Location: Right Arm, Patient Position: Sitting, Cuff Size: Normal)   Pulse 76   Temp 98.7 F (37.1 C)   Ht 5\' 2"  (1.575 m)   Wt 137 lb 0.6 oz (62.2 kg)   SpO2 100%   BMI 25.06 kg/m   Pleasant  female, alert and oriented x 3, in no cardio-pulmonary distress. Afebrile. HEENT No facial trauma or asymetry. Sinuses non tender.  Extra occullar muscles intact,External ears normal Oropharynx moist, no exudate. Neck: supple, no adenopathy,JVD or thyromegaly.No bruits.  Chest: Clear to ascultation bilaterally.No crackles or wheezes. Non tender to palpation  Breast: No asymetry,no masses or lumps. No tenderness. No nipple discharge or inversion. No axillary or supraclavicular adenopathy  Cardiovascular system; Heart sounds normal,  S1 and  S2 ,no S3.  No murmur, or thrill. Apical beat not displaced Peripheral pulses normal.  Abdomen: Soft, non tender, no organomegaly or masses. No bruits. Bowel sounds normal. No guarding, tenderness or rebound.  GU: Asymptomatic, no exam indicated  Musculoskeletal exam: Full ROM of spine, hips , shoulders and knees. No deformity ,swelling or crepitus noted. No muscle wasting or atrophy.   Neurologic: Cranial nerves 2 to 12 intact. Power, tone ,sensation and reflexes normal throughout. No disturbance in gait. No tremor.  Skin: Intact, no ulceration, erythema , scaling or rash noted. Pigmentation normal throughout  Psych; Normal mood and affect. Judgement and concentration normal   Assessment & Plan:  Annual physical exam Annual exam as documented. Counseling done  re healthy lifestyle involving commitment to 150 minutes exercise per week, heart  healthy diet, and attaining healthy weight.The importance of adequate sleep also discussed. Regular seat belt use and home safety, is also discussed. Changes in health habits are decided on by the patient with goals and time frames  set for achieving them. Immunization and cancer screening needs are specifically addressed at this visit.   Need for 23-polyvalent pneumococcal polysaccharide vaccine After obtaining informed consent, the vaccine is  administered , with no adverse effect noted at the time of administration.

## 2018-09-30 NOTE — Assessment & Plan Note (Signed)
After obtaining informed consent, the vaccine is  administered , with no adverse effect noted at the time of administration.  

## 2018-10-05 ENCOUNTER — Encounter: Payer: Self-pay | Admitting: Family Medicine

## 2018-10-05 LAB — LIPID PANEL
Cholesterol: 183 mg/dL (ref <200)
HDL: 37 mg/dL — ABNORMAL LOW (ref >=50)
LDL Cholesterol (Calc): 124 mg/dL (calc) — ABNORMAL HIGH
Non-HDL Cholesterol (Calc): 146 mg/dL (calc) — ABNORMAL HIGH (ref <130)
Total CHOL/HDL Ratio: 4.9 (calc) (ref <5.0)
Triglycerides: 112 mg/dL (ref <150)

## 2018-10-05 LAB — HEMOGLOBIN A1C
Hgb A1c MFr Bld: 6.2 % of total Hgb — ABNORMAL HIGH (ref <5.7)
Mean Plasma Glucose: 131 (calc)
eAG (mmol/L): 7.3 (calc)

## 2018-10-05 LAB — TSH: TSH: 2.32 mIU/L (ref 0.40–4.50)

## 2018-10-08 NOTE — Progress Notes (Signed)
Office Visit Note  Patient: [name]             Date of Birth: Feb 06, 1951           MRN: [id number]             PCP: [name], MD Referring: [name], MD Visit Date: 10/22/2018 Occupation: @GUAROCC @  Subjective:  Medication monitoring   History of Present Illness: [name] is a 68 y.o. female with history of systemic lupus dermatosis.  She states she is doing well without any concerns.  She states she was seen by Dr. Lowanda Foster her nephrologist approximately 6 months ago.  She was advised to come back in 1 year.  Her disease is a stable.  She denies any history of oral ulcers, nasal ulcers, rash, photosensitivity, Raynaud's.  Activities of Daily Living:  Patient reports morning stiffness for 0 minute.   Patient Denies nocturnal pain.  Difficulty dressing/grooming: Denies Difficulty climbing stairs: Denies Difficulty getting out of chair: Denies Difficulty using hands for taps, buttons, cutlery, and/or writing: Denies  Review of Systems  Constitutional: Negative for fatigue, night sweats, weight gain and weight loss.  HENT: Positive for mouth dryness. Negative for mouth sores, trouble swallowing, trouble swallowing and nose dryness.   Eyes: Negative for pain, redness, visual disturbance and dryness.  Respiratory: Negative for cough, shortness of breath and difficulty breathing.   Cardiovascular: Negative for chest pain, palpitations, hypertension, irregular heartbeat and swelling in legs/feet.  Gastrointestinal: Negative for blood in stool, constipation and diarrhea.  Endocrine: Negative for increased urination.  Genitourinary: Negative for vaginal dryness.  Musculoskeletal: Negative for arthralgias, joint pain, joint swelling, myalgias, muscle weakness, morning stiffness, muscle tenderness and myalgias.  Skin: Negative for color change, rash, hair loss, skin tightness, ulcers and sensitivity to sunlight.  Allergic/Immunologic: Negative for  susceptible to infections.  Neurological: Negative for dizziness, memory loss, night sweats and weakness.  Hematological: Negative for swollen glands.  Psychiatric/Behavioral: Negative for depressed mood and sleep disturbance. The patient is not nervous/anxious.     PMFS History:  Patient Active Problem List   Diagnosis Date Noted  . Need for 23-polyvalent pneumococcal polysaccharide vaccine 09/30/2018  . Lupus nephritis ([place]) 1999-2000  09/15/2016  . High risk medication use 09/15/2016  . DDD (degenerative disc disease), lumbar 09/15/2016  . History of hyperlipidemia 09/15/2016  . History of anemia 09/15/2016  . History of proteinuria  09/15/2016  . Dermatitis of face 09/15/2016  . Hair loss 09/15/2016  . Tubular adenoma of colon 04/30/2016  . Prediabetes 11/15/2014  . Annual physical exam 01/06/2014  . Osteopenia 07/21/2013  . SLE (systemic lupus erythematosus) ([place]) 02/15/2012  . History of diverticulosis 10/01/2007    Past Medical History:  Diagnosis Date  . Arthritis 02/13/10   abnormal MRI lumbar and c spine  with disc disease  . SLE (systemic lupus erythematosus) ([place]) dx apprx 1990   reports immunotherapy, cytoxan and steroids at [facility], no f/u for over 70yrs    Family History  Problem Relation Age of Onset  . Heart disease Father   . Cancer Brother 68       breast  . Cancer Sister 87       breast  . Colon cancer Neg Hx    Past Surgical History:  Procedure Laterality Date  . ABDOMINAL HYSTERECTOMY  unsure, over 10 yrs   partial, has had abnormal pap  . BLADDER REPAIR    . COLONOSCOPY N/A 01/05/2014   Procedure:  COLONOSCOPY;  Surgeon: Daneil Dolin, MD;  Location[facility];  Service: Endoscopy;  Laterality: N/A;  12:15 PM   Social History   Social History Narrative  . Not on file   Immunization History  Administered Date(s) Administered  . Influenza Split 02/14/2012  . Influenza Whole 12/28/2000  . Influenza,inj,Quad PF,6+ Mos 12/16/2012,  01/06/2014, 01/19/2016, 12/10/2017  . Pneumococcal Conjugate-13 09/25/2017  . Pneumococcal Polysaccharide-23 09/30/2018  . Td 09/17/2003  . Tdap 01/06/2014  . Zoster 12/17/2012     Objective: Vital Signs: BP (!) 144/81 (BP Location: Left Arm, Patient Position: Sitting, Cuff Size: Small)   Pulse 61   Resp 12   Ht 5\' 2"  (1.575 m)   Wt 132 lb 9.6 oz (60.1 kg)   BMI 24.25 kg/m    Physical Exam Vitals signs and nursing note reviewed.  Constitutional:      Appearance: She is well-developed.  HENT:     Head: Normocephalic and atraumatic.  Eyes:     Conjunctiva/sclera: Conjunctivae normal.  Neck:     Musculoskeletal: Normal range of motion.  Cardiovascular:     Rate and Rhythm: Normal rate and regular rhythm.     Heart sounds: Normal heart sounds.  Pulmonary:     Effort: Pulmonary effort is normal.     Breath sounds: Normal breath sounds.  Abdominal:     General: Bowel sounds are normal.     Palpations: Abdomen is soft.  Lymphadenopathy:     Cervical: No cervical adenopathy.  Skin:    General: Skin is warm and dry.     Capillary Refill: Capillary refill takes less than 2 seconds.  Neurological:     Mental Status: She is alert and oriented to person, place, and time.  Psychiatric:        Behavior: Behavior normal.      Musculoskeletal Exam: C-spine was not a range of motion.  Shoulder joints, elbow joints, wrist joints, MCPs PIPs and DIPs with good range of motion with no synovitis.  Hip joints, knee joints, ankles, MTPs and PIPs and good range of motion with no synovitis.  CDAI Exam: CDAI Score: - Patient Global: -; Provider Global: - Swollen: -; Tender: - Joint Exam   No joint exam has been documented for this visit   There is currently no information documented on the homunculus. Go to the Rheumatology activity and complete the homunculus joint exam.  Investigation: No additional findings.  Imaging: No results found.  Recent Labs: Lab Results  Component  Value Date   WBC 4.1 05/17/2018   HGB 13.1 05/17/2018   PLT 173 05/17/2018   NA 138 05/17/2018   K 4.6 05/17/2018   CL 104 05/17/2018   CO2 27 05/17/2018   GLUCOSE 67 05/17/2018   BUN 21 05/17/2018   CREATININE 1.11 (H) 05/17/2018   BILITOT 0.3 05/17/2018   ALKPHOS 43 09/18/2016   AST 29 05/17/2018   ALT 22 05/17/2018   PROT 7.0 05/17/2018   ALBUMIN 3.8 09/18/2016   CALCIUM 9.7 05/17/2018   GFRAA 60 05/17/2018    Speciality Comments: Plaquenil eye exam normal on 09/12/2016 per my eye doctor in [place]  Procedures:  No procedures performed Allergies: Sulfonamide derivatives   Assessment / Plan:     Visit Diagnoses: Other systemic lupus erythematosus with other organ involvement ([place]) - history of dsDNA +64, Smith + 5.5, RNP+ 6.5, SSA([place]) Negative, SSB ([place]) Negative, patient presented initially with nephritis, hair loss, arthralgias, dermatitis - Plan: Patient is clinically doing  well with no synovitis on examination.  She had no active disease currently.  She has been tolerating Plaquenil well.  Lupus nephritis ([place]) 1999-2000  - 1999-2000 treated with Cytoxan IV for total for dosage and prednisone for one year.  She has been followed by her nephrologist closely - Plan: According to patient know she will be seeing her nephrologist on a yearly basis.  Dermatitis - Plan: She has no rash on examination.  High risk medication use - PLQ 200 mg p.o. twice daily Monday through Friday. eye exam: 09/12/2016 -patient will get labs today and she is scheduled to have eye examination next month.  History of renal insufficiency -her GFR has been stable.  DDD (degenerative disc disease), lumbar -she is currently not having much discomfort.  Age-related osteoporosis without current pathological fracture -Osteoporosis-Managed by Dr. Moshe Cipro.  She is on Fosamax 70 mg weekly started in October 2019.  Most recent DEXA on 01/09/2018. showed T-score of -2.8 at [facility] spine with a -6.7% decrease in BMD  compared to 2015 DEXA. Per patient she was placed on Fosamax by her PCP.  History of hyperlipidemia -monitored by her PCP  History of adenomatous polyp of colon   History of diverticulosis   History of prediabetes   Orders: Orders Placed This Encounter  Procedures  . CBC with Differential/Platelet  . COMPLETE METABOLIC PANEL WITH GFR  . Urinalysis, Routine w reflex microscopic  . Anti-DNA antibody, double-stranded  . C3 and C4  . Sedimentation rate   No orders of the defined types were placed in this encounter.     Follow-Up Instructions: Return in about 5 months (around 03/24/2019) for Systemic lupus.   Bo Merino, MD  Note - This record has been created using Editor, commissioning.  Chart creation errors have been sought, but may not always  have been located. Such creation errors do not reflect on  the standard of medical care.

## 2018-10-22 ENCOUNTER — Encounter: Payer: Self-pay | Admitting: Rheumatology

## 2018-10-22 ENCOUNTER — Other Ambulatory Visit: Payer: Self-pay

## 2018-10-22 ENCOUNTER — Ambulatory Visit (INDEPENDENT_AMBULATORY_CARE_PROVIDER_SITE_OTHER): Payer: Medicare Other | Admitting: Rheumatology

## 2018-10-22 VITALS — BP 144/81 | HR 61 | Resp 12 | Ht 62.0 in | Wt 132.6 lb

## 2018-10-22 DIAGNOSIS — Z87448 Personal history of other diseases of urinary system: Secondary | ICD-10-CM

## 2018-10-22 DIAGNOSIS — Z86010 Personal history of colonic polyps: Secondary | ICD-10-CM

## 2018-10-22 DIAGNOSIS — M3219 Other organ or system involvement in systemic lupus erythematosus: Secondary | ICD-10-CM | POA: Diagnosis not present

## 2018-10-22 DIAGNOSIS — Z8719 Personal history of other diseases of the digestive system: Secondary | ICD-10-CM

## 2018-10-22 DIAGNOSIS — Z87898 Personal history of other specified conditions: Secondary | ICD-10-CM

## 2018-10-22 DIAGNOSIS — M5136 Other intervertebral disc degeneration, lumbar region: Secondary | ICD-10-CM

## 2018-10-22 DIAGNOSIS — Z79899 Other long term (current) drug therapy: Secondary | ICD-10-CM

## 2018-10-22 DIAGNOSIS — L309 Dermatitis, unspecified: Secondary | ICD-10-CM

## 2018-10-22 DIAGNOSIS — M810 Age-related osteoporosis without current pathological fracture: Secondary | ICD-10-CM

## 2018-10-22 DIAGNOSIS — M3214 Glomerular disease in systemic lupus erythematosus: Secondary | ICD-10-CM

## 2018-10-22 DIAGNOSIS — Z8639 Personal history of other endocrine, nutritional and metabolic disease: Secondary | ICD-10-CM

## 2018-10-22 MED ORDER — HYDROXYCHLOROQUINE SULFATE 200 MG PO TABS: ORAL_TABLET | ORAL | 0 refills | Status: DC

## 2018-10-23 LAB — URINALYSIS, ROUTINE W REFLEX MICROSCOPIC
Bilirubin Urine: NEGATIVE
Glucose, UA: NEGATIVE
Hgb urine dipstick: NEGATIVE
Ketones, ur: NEGATIVE
Leukocytes,Ua: NEGATIVE
Nitrite: NEGATIVE
Protein, ur: NEGATIVE
Specific Gravity, Urine: 1.005 (ref 1.001–1.03)
pH: 5.5 (ref 5.0–8.0)

## 2018-10-23 LAB — CBC WITH DIFFERENTIAL/PLATELET
Absolute Monocytes: 555 cells/uL (ref 200–950)
Basophils Absolute: 50 cells/uL (ref 0–200)
Basophils Relative: 1 %
Eosinophils Absolute: 50 cells/uL (ref 15–500)
Eosinophils Relative: 1 %
HCT: 41.5 % (ref 35.0–45.0)
Hemoglobin: 13.5 g/dL (ref 11.7–15.5)
Lymphs Abs: 1845 cells/uL (ref 850–3900)
MCH: 28.2 pg (ref 27.0–33.0)
MCHC: 32.5 g/dL (ref 32.0–36.0)
MCV: 86.6 fL (ref 80.0–100.0)
MPV: 12.8 fL — ABNORMAL HIGH (ref 7.5–12.5)
Monocytes Relative: 11.1 %
Neutro Abs: 2500 cells/uL (ref 1500–7800)
Neutrophils Relative %: 50 %
Platelets: 163 10*3/uL (ref 140–400)
RBC: 4.79 10*6/uL (ref 3.80–5.10)
RDW: 13.2 % (ref 11.0–15.0)
Total Lymphocyte: 36.9 %
WBC: 5 10*3/uL (ref 3.8–10.8)

## 2018-10-23 LAB — COMPLETE METABOLIC PANEL WITH GFR
AG Ratio: 1.2 (calc) (ref 1.0–2.5)
ALT: 21 U/L (ref 6–29)
AST: 29 U/L (ref 10–35)
Albumin: 4.3 g/dL (ref 3.6–5.1)
Alkaline phosphatase (APISO): 29 U/L — ABNORMAL LOW (ref 37–153)
BUN/Creatinine Ratio: 15 (calc) (ref 6–22)
BUN: 18 mg/dL (ref 7–25)
CO2: 28 mmol/L (ref 20–32)
Calcium: 9.9 mg/dL (ref 8.6–10.4)
Chloride: 103 mmol/L (ref 98–110)
Creat: 1.21 mg/dL — ABNORMAL HIGH (ref 0.50–0.99)
GFR, Est African American: 54 mL/min/{1.73_m2} — ABNORMAL LOW (ref >=60)
GFR, Est Non African American: 47 mL/min/{1.73_m2} — ABNORMAL LOW (ref >=60)
Globulin: 3.7 g/dL (calc) (ref 1.9–3.7)
Glucose, Bld: 82 mg/dL (ref 65–99)
Potassium: 4.5 mmol/L (ref 3.5–5.3)
Sodium: 139 mmol/L (ref 135–146)
Total Bilirubin: 0.3 mg/dL (ref 0.2–1.2)
Total Protein: 8 g/dL (ref 6.1–8.1)

## 2018-10-23 LAB — C3 AND C4
C3 Complement: 120 mg/dL (ref 83–193)
C4 Complement: 24 mg/dL (ref 15–57)

## 2018-10-23 LAB — ANTI-DNA ANTIBODY, DOUBLE-STRANDED: ds DNA Ab: 8 IU/mL — ABNORMAL HIGH

## 2018-10-23 LAB — SEDIMENTATION RATE: Sed Rate: 11 mm/h (ref 0–30)

## 2018-10-23 NOTE — Progress Notes (Signed)
Creatinine is mildly elevated.  We will continue to monitor labs.  Rest the labs are stable.  Please forward a copy of the labs to her PCP.

## 2018-11-04 ENCOUNTER — Other Ambulatory Visit: Payer: Self-pay

## 2018-11-04 ENCOUNTER — Ambulatory Visit (INDEPENDENT_AMBULATORY_CARE_PROVIDER_SITE_OTHER): Payer: Medicare Other | Admitting: Family Medicine

## 2018-11-04 ENCOUNTER — Encounter: Payer: Self-pay | Admitting: Family Medicine

## 2018-11-04 VITALS — BP 124/80 | HR 53 | Temp 98.4°F | Resp 15 | Ht 62.0 in | Wt 132.0 lb

## 2018-11-04 DIAGNOSIS — J309 Allergic rhinitis, unspecified: Secondary | ICD-10-CM

## 2018-11-04 DIAGNOSIS — H6691 Otitis media, unspecified, right ear: Secondary | ICD-10-CM

## 2018-11-04 DIAGNOSIS — H6121 Impacted cerumen, right ear: Secondary | ICD-10-CM | POA: Diagnosis not present

## 2018-11-04 DIAGNOSIS — R42 Dizziness and giddiness: Secondary | ICD-10-CM

## 2018-11-04 MED ORDER — CHLORPHENIRAMINE MALEATE 4 MG PO TABS: 4.0000 mg | ORAL_TABLET | Freq: Two times a day (BID) | ORAL | 0 refills | Status: DC | PRN

## 2018-11-04 MED ORDER — PENICILLIN V POTASSIUM 500 MG PO TABS: 500.0000 mg | ORAL_TABLET | Freq: Three times a day (TID) | ORAL | 0 refills | Status: DC

## 2018-11-04 MED ORDER — LORATADINE 10 MG PO TABS: 10.0000 mg | ORAL_TABLET | Freq: Every day | ORAL | 1 refills | Status: DC | PRN

## 2018-11-04 MED ORDER — MECLIZINE HCL 12.5 MG PO TABS: 12.5000 mg | ORAL_TABLET | Freq: Three times a day (TID) | ORAL | 0 refills | Status: DC | PRN

## 2018-11-04 NOTE — Patient Instructions (Signed)
Follow-up as before call if you need me sooner.  Your treated today for acute infection of the right ear 1 week course of penicillin as prescribed.  Your treated for uncontrolled allergies with excessive drainage daily loratadine is prescribed for as needed use as well as chlorpheniramine to reduce the pressure and drainage short-term.  Your treated for vertigo with Antivert prescribed on an as-needed basis.  You do have cerumen impacted in the right ear partially and we will attempt to flush at this visit.  If unsuccessful you will be referred to ear nose and throat to eNT

## 2018-11-04 NOTE — Progress Notes (Signed)
   [name]     MRN: [id number]      DOB: 1950-11-10   HPI [name] is here with a 3-day history of right ear pain  associated with vertigo.  She states she has had this in the past and was seen in urgent care and had a flushing done which helped.  She denies any fever or chills but does note increased postnasal drainage which is clear in the past 3 days.    She denies any cough.  ROS . Denies chest pains, palpitations and leg swelling Denies abdominal pain, nausea, vomiting,diarrhea or constipation.   Denies dysuria, frequency, hesitancy or incontinence. Denies joint pain, swelling and limitation in mobility. Denies headaches, seizures, numbness, or tingling. Denies depression, anxiety or insomnia. Denies skin break down or rash.   PE  BP 124/80   Pulse (!) 53   Temp 98.4 F (36.9 C) (Temporal)   Resp 15   Ht 5\' 2"  (1.575 m)   Wt 132 lb (59.9 kg)   SpO2 99%   BMI 24.14 kg/m   Patient alert and oriented and in no cardiopulmonary distress.  HEENT: No facial asymmetry, EOMI,   oropharynx pink and moist.  Neck supple no JVD, no mass. Left TM clear, right TM partially  occluded by cerumen.TM erythematous No sinus tenderness Chest: Clear to auscultation bilaterally.  CVS: S1, S2 no murmurs, no S3.Regular rate.  ABD: Soft non tender.   Ext: No edema  MS: Adequate ROM spine, shoulders, hips and knees.  Skin: Intact, no ulcerations or rash noted.  Psych: Good eye contact, normal affect. Memory intact not anxious or depressed appearing.  CNS: CN 2-12 intact, power,  normal throughout.no focal deficits noted.   Assessment & Plan  Vertigo Symptomatic , antivert prescribed  Impacted cerumen of right ear Successful ear flush by LPN  Allergic sinusitis Uncontrolled, symptomatic, decongestant and antihistamine prescribed  Right otitis media Pen V x 1 week prescribed

## 2018-11-08 ENCOUNTER — Encounter: Payer: Self-pay | Admitting: Family Medicine

## 2018-11-08 NOTE — Assessment & Plan Note (Addendum)
Uncontrolled, symptomatic, decongestant and antihistamine prescribed

## 2018-11-08 NOTE — Assessment & Plan Note (Signed)
Successful ear flush by LPN

## 2018-11-08 NOTE — Assessment & Plan Note (Signed)
Symptomatic , antivert prescribed

## 2018-11-08 NOTE — Assessment & Plan Note (Signed)
Pen V x 1 week prescribed

## 2018-11-18 ENCOUNTER — Telehealth: Payer: Self-pay | Admitting: *Deleted

## 2018-11-18 ENCOUNTER — Ambulatory Visit (INDEPENDENT_AMBULATORY_CARE_PROVIDER_SITE_OTHER): Payer: Medicare Other

## 2018-11-18 ENCOUNTER — Other Ambulatory Visit: Payer: Self-pay

## 2018-11-18 ENCOUNTER — Other Ambulatory Visit: Payer: Self-pay | Admitting: Family Medicine

## 2018-11-18 DIAGNOSIS — Z23 Encounter for immunization: Secondary | ICD-10-CM

## 2018-11-18 MED ORDER — PENICILLIN V POTASSIUM 500 MG PO TABS: 500.0000 mg | ORAL_TABLET | Freq: Three times a day (TID) | ORAL | 0 refills | Status: AC

## 2018-11-18 NOTE — Telephone Encounter (Signed)
States she still has a very dull pain left in her ear. Doesn't know if she needs more antibiotics or what you recommend.

## 2018-11-18 NOTE — Telephone Encounter (Signed)
Pt called and wanted to see if Dr. Moshe Cipro would refill her penicllin. She took her last one Thursday. Her right ear is still giving her trouble not terribly but enough to bother her. This can be sent to [facility]

## 2018-11-18 NOTE — Telephone Encounter (Signed)
I spoke with pt, denies hearing loss, drainage or severe pain. I prescribed 5 additional days of penicllin, if persists she will send msg and will be referred to ENT

## 2018-12-06 ENCOUNTER — Encounter: Payer: Self-pay | Admitting: Family Medicine

## 2018-12-09 ENCOUNTER — Other Ambulatory Visit: Payer: Self-pay | Admitting: Family Medicine

## 2018-12-09 DIAGNOSIS — J3489 Other specified disorders of nose and nasal sinuses: Secondary | ICD-10-CM

## 2018-12-09 DIAGNOSIS — H9201 Otalgia, right ear: Secondary | ICD-10-CM

## 2018-12-12 ENCOUNTER — Other Ambulatory Visit: Payer: Self-pay | Admitting: Family Medicine

## 2018-12-13 ENCOUNTER — Ambulatory Visit (INDEPENDENT_AMBULATORY_CARE_PROVIDER_SITE_OTHER): Payer: Medicare Other | Admitting: Family Medicine

## 2018-12-13 ENCOUNTER — Encounter: Payer: Self-pay | Admitting: Family Medicine

## 2018-12-13 ENCOUNTER — Other Ambulatory Visit: Payer: Self-pay

## 2018-12-13 VITALS — BP 124/80 | HR 53 | Resp 15 | Ht 62.0 in | Wt 132.0 lb

## 2018-12-13 DIAGNOSIS — Z0000 Encounter for general adult medical examination without abnormal findings: Secondary | ICD-10-CM

## 2018-12-13 NOTE — Progress Notes (Signed)
Subjective:   [name] is a 68 y.o. female who presents for Medicare Annual (Subsequent) preventive examination.  Location of Patient: Home Location of Provider: Telehealth Consent was obtain for visit to be over via telehealth. I verified that I am speaking with the correct person using two identifiers.   Review of Systems:    Cardiac Risk Factors include: advanced age (>31men, >67 women)     Objective:     Vitals: BP 124/80   Pulse (!) 53   Resp 15   Ht 5\' 2"  (1.575 m)   Wt 132 lb (59.9 kg)   BMI 24.14 kg/m   Body mass index is 24.14 kg/m.  Advanced Directives 01/05/2014  Does Patient Have a Medical Advance Directive? No  Would patient like information on creating a medical advance directive? Yes - Scientist, clinical (histocompatibility and immunogenetics) given    Tobacco Social History   Tobacco Use  Smoking Status Never Smoker  Smokeless Tobacco Never Used     Counseling given: Yes   Clinical Intake:  Pre-visit preparation completed: Yes  Pain : No/denies pain Pain Score: 0-No pain     BMI - recorded: 24.14 Nutritional Status: BMI of 19-24  Normal Nutritional Risks: None Diabetes: No  How often do you need to have someone help you when you read instructions, pamphlets, or other written materials from your doctor or pharmacy?: 1 - Never What is the last grade level you completed in school?: 12  Interpreter Needed?: No     Past Medical History:  Diagnosis Date  . Arthritis 02/13/10   abnormal MRI lumbar and c spine  with disc disease  . SLE (systemic lupus erythematosus) ([place]) dx apprx 1990   reports immunotherapy, cytoxan and steroids at [facility], no f/u for over 60yrs   Past Surgical History:  Procedure Laterality Date  . ABDOMINAL HYSTERECTOMY  unsure, over 10 yrs   partial, has had abnormal pap  . BLADDER REPAIR    . COLONOSCOPY N/A 01/05/2014   Procedure: COLONOSCOPY;  Surgeon: Daneil Dolin, MD;  Location[facility];  Service: Endoscopy;  Laterality: N/A;   12:15 PM   Family History  Problem Relation Age of Onset  . Heart disease Father   . Cancer Brother 16       breast  . Cancer Sister 21       breast  . Colon cancer Neg Hx    Social History   Socioeconomic History  . Marital status: Married    Spouse name: Not on file  . Number of children: Not on file  . Years of education: Not on file  . Highest education level: Not on file  Occupational History  . Not on file  Social Needs  . Financial resource strain: Not on file  . Food insecurity    Worry: Not on file    Inability: Not on file  . Transportation needs    Medical: Not on file    Non-medical: Not on file  Tobacco Use  . Smoking status: Never Smoker  . Smokeless tobacco: Never Used  Substance and Sexual Activity  . Alcohol use: No  . Drug use: No  . Sexual activity: Not Currently  Lifestyle  . Physical activity    Days per week: 5 days    Minutes per session: 50 min  . Stress: Not at all  Relationships  . Social connections    Talks on phone: More than three times a week    Gets together: Once  a week    Attends religious service: More than 4 times per year    Active member of club or organization: Yes    Attends meetings of clubs or organizations: More than 4 times per year    Relationship status: Married  Other Topics Concern  . Not on file  Social History Narrative  . Not on file    Outpatient Encounter Medications as of 12/13/2018  Medication Sig  . alendronate (FOSAMAX) 70 MG tablet TAKE ONE TABLET (70MG  TOTAL) BY MOUTH EVERY 7 DAYS. TAKE WITH A FULL GLASS OF WATER ON AND EMPTY STOMACH.  Marland Kitchen aspirin 81 MG tablet Take 81 mg by mouth daily.  . calcium gluconate 500 MG tablet Take 1 tablet by mouth daily.  . chlorpheniramine (CHLOR-TRIMETON) 4 MG tablet Take 1 tablet (4 mg total) by mouth 2 (two) times daily as needed for allergies.  . cyanocobalamin 1000 MCG tablet Take 1,000 mcg by mouth daily.  . hydroxychloroquine (PLAQUENIL) 200 MG tablet Take 200mg   by mouth twice daily, Monday-Friday only. None on Saturday or Sunday.  . loratadine (CLARITIN) 10 MG tablet Take 1 tablet (10 mg total) by mouth daily as needed for allergies.  Marland Kitchen meclizine (ANTIVERT) 12.5 MG tablet Take 1 tablet (12.5 mg total) by mouth 3 (three) times daily as needed for dizziness.  . Multiple Vitamin (MULTIVITAMIN) capsule Take 1 capsule by mouth daily.  . vitamin E 400 UNIT capsule Take 400 Units by mouth daily.   No facility-administered encounter medications on file as of 12/13/2018.     Activities of Daily Living In your present state of health, do you have any difficulty performing the following activities: 12/13/2018  Hearing? N  Vision? N  Difficulty concentrating or making decisions? N  Walking or climbing stairs? N  Dressing or bathing? N  Doing errands, shopping? N  Preparing Food and eating ? N  Using the Toilet? N  In the past six months, have you accidently leaked urine? N  Do you have problems with loss of bowel control? N  Managing your Medications? N  Managing your Finances? N  Housekeeping or managing your Housekeeping? N  Some recent data might be hidden    Patient Care Team: [name], MD as PCP - General Rourk, Cristopher Estimable, MD as Consulting Physician (Gastroenterology)    Assessment:   This is a routine wellness examination for [place].  Exercise Activities and Dietary recommendations Current Exercise Habits: Home exercise routine, Type of exercise: walking;strength training/weights, Time (Minutes): 45, Frequency (Times/Week): 5, Weekly Exercise (Minutes/Week): 225, Intensity: Moderate, Exercise limited by: None identified  Goals   None     Fall Risk Fall Risk  12/13/2018 11/04/2018 09/30/2018 12/10/2017 09/25/2017  Falls in the past year? 0 0 0 No Yes  Number falls in past yr: 0 0 - - 1  Injury with Fall? 0 0 0 - No  Risk for fall due to : - - - - Other (Comment)  Risk for fall due to: Comment - - - - lost balance, hit corner and  fell    Is the patient's home free of loose throw rugs in walkways, pet beds, electrical cords, etc?   yes      Grab bars in the bathroom? yes      Handrails on the stairs?   yes      Adequate lighting?   yes     Depression Screen PHQ 2/9 Scores 12/13/2018 11/04/2018 09/30/2018 12/10/2017  PHQ - 2 Score 0  0 0 0  PHQ- 9 Score - - - -     Cognitive Function     6CIT Screen 12/13/2018  What Year? 0 points  What month? 0 points  What time? 0 points  Count back from 20 0 points  Months in reverse 0 points  Repeat phrase 0 points  Total Score 0    Immunization History  Administered Date(s) Administered  . Fluad Quad(high Dose 65+) 11/18/2018  . Influenza Split 02/14/2012  . Influenza Whole 12/28/2000  . Influenza,inj,Quad PF,6+ Mos 12/16/2012, 01/06/2014, 01/19/2016, 12/10/2017  . Pneumococcal Conjugate-13 09/25/2017  . Pneumococcal Polysaccharide-23 09/30/2018  . Td 09/17/2003  . Tdap 01/06/2014  . Zoster 12/17/2012    Qualifies for Shingles Vaccine? Completed  Screening Tests Health Maintenance  Topic Date Due  . Hepatitis C Screening  1950-12-28  . MAMMOGRAM  01/10/2020  . COLONOSCOPY  01/06/2024  . TETANUS/TDAP  01/07/2024  . INFLUENZA VACCINE  Completed  . DEXA SCAN  Completed  . PNA vac Low Risk Adult  Completed    Cancer Screenings: Lung: Low Dose CT Chest recommended if Age 7-80 years, 30 pack-year currently smoking OR have quit w/in 15years. Patient does not qualify. Breast:  Up to date on Mammogram? Yes   Up to date of Bone Density/Dexa? Yes Colorectal: Due 2025  Additional Screenings:  Hepatitis C Screening: needs      Plan:      1. Encounter for Medicare annual wellness exam   I have personally reviewed and noted the following in the patient's chart:   . Medical and social history . Use of alcohol, tobacco or illicit drugs  . Current medications and supplements . Functional ability and status . Nutritional status . Physical activity .  Advanced directives . List of other physicians . Hospitalizations, surgeries, and ER visits in previous 12 months . Vitals . Screenings to include cognitive, depression, and falls . Referrals and appointments  In addition, I have reviewed and discussed with patient certain preventive protocols, quality metrics, and best practice recommendations. A written personalized care plan for preventive services as well as general preventive health recommendations were provided to patient.    I provided 20 minutes of non-face-to-face time during this encounter.   Perlie Mayo, NP  12/13/2018

## 2018-12-13 NOTE — Patient Instructions (Addendum)
[name] , Thank you for taking time to come for your Medicare Wellness Visit. I appreciate your ongoing commitment to your health goals. Please review the following plan we discussed and let me know if I can assist you in the future.   Please continue to practice social distancing to keep you, your family, and our community safe.  If you must go out, please wear a Mask and practice good handwashing.   Screening recommendations/referrals: Colonoscopy: Due 2025 Mammogram: Up to date Bone Density: Up to date Recommended yearly ophthalmology/optometry visit for glaucoma screening and checkup Recommended yearly dental visit for hygiene and checkup  Vaccinations: Influenza vaccine: Completed Pneumococcal vaccine: Completed Tdap vaccine: Due 2025 Shingles vaccine: Completed  Advanced directives: Discuss at future viist  Conditions/risks identified: Falls  Next appointment: Needs 1 year in July 2021 for Annual with Dr Moshe Cipro, and 1 year for AWV in Sept 2021    Preventive Care 31 Years and Older, Female Preventive care refers to lifestyle choices and visits with your health care provider that can promote health and wellness. What does preventive care include?  A yearly physical exam. This is also called an annual well check.  Dental exams once or twice a year.  Routine eye exams. Ask your health care provider how often you should have your eyes checked.  Personal lifestyle choices, including:  Daily care of your teeth and gums.  Regular physical activity.  Eating a healthy diet.  Avoiding tobacco and drug use.  Limiting alcohol use.  Practicing safe sex.  Taking low-dose aspirin every day.  Taking vitamin and mineral supplements as recommended by your health care provider. What happens during an annual well check? The services and screenings done by your health care provider during your annual well check will depend on your age, overall health, lifestyle risk factors,  and family history of disease. Counseling  Your health care provider may ask you questions about your:  Alcohol use.  Tobacco use.  Drug use.  Emotional well-being.  Home and relationship well-being.  Sexual activity.  Eating habits.  History of falls.  Memory and ability to understand (cognition).  Work and work Statistician.  Reproductive health. Screening  You may have the following tests or measurements:  Height, weight, and BMI.  Blood pressure.  Lipid and cholesterol levels. These may be checked every 5 years, or more frequently if you are over 9 years old.  Skin check.  Lung cancer screening. You may have this screening every year starting at age 51 if you have a 30-pack-year history of smoking and currently smoke or have quit within the past 15 years.  Fecal occult blood test (FOBT) of the stool. You may have this test every year starting at age 34.  Flexible sigmoidoscopy or colonoscopy. You may have a sigmoidoscopy every 5 years or a colonoscopy every 10 years starting at age 43.  Hepatitis C blood test.  Hepatitis B blood test.  Sexually transmitted disease (STD) testing.  Diabetes screening. This is done by checking your blood sugar (glucose) after you have not eaten for a while (fasting). You may have this done every 1-3 years.  Bone density scan. This is done to screen for osteoporosis. You may have this done starting at age 87.  Mammogram. This may be done every 1-2 years. Talk to your health care provider about how often you should have regular mammograms. Talk with your health care provider about your test results, treatment options, and if necessary, the need for  more tests. Vaccines  Your health care provider may recommend certain vaccines, such as:  Influenza vaccine. This is recommended every year.  Tetanus, diphtheria, and acellular pertussis (Tdap, Td) vaccine. You may need a Td booster every 10 years.  Zoster vaccine. You may need  this after age 97.  Pneumococcal 13-valent conjugate (PCV13) vaccine. One dose is recommended after age 52.  Pneumococcal polysaccharide (PPSV23) vaccine. One dose is recommended after age 74. Talk to your health care provider about which screenings and vaccines you need and how often you need them. This information is not intended to replace advice given to you by your health care provider. Make sure you discuss any questions you have with your health care provider. Document Released: 04/09/2015 Document Revised: 12/01/2015 Document Reviewed: 01/12/2015 Elsevier Interactive Patient Education  2017 [employer] Prevention in the Home Falls can cause injuries. They can happen to people of all ages. There are many things you can do to make your home safe and to help prevent falls. What can I do on the outside of my home?  Regularly fix the edges of walkways and driveways and fix any cracks.  Remove anything that might make you trip as you walk through a door, such as a raised step or threshold.  Trim any bushes or trees on the path to your home.  Use bright outdoor lighting.  Clear any walking paths of anything that might make someone trip, such as rocks or tools.  Regularly check to see if handrails are loose or broken. Make sure that both sides of any steps have handrails.  Any raised decks and porches should have guardrails on the edges.  Have any leaves, snow, or ice cleared regularly.  Use sand or salt on walking paths during winter.  Clean up any spills in your garage right away. This includes oil or grease spills. What can I do in the bathroom?  Use night lights.  Install grab bars by the toilet and in the tub and shower. Do not use towel bars as grab bars.  Use non-skid mats or decals in the tub or shower.  If you need to sit down in the shower, use a plastic, non-slip stool.  Keep the floor dry. Clean up any water that spills on the floor as soon as it  happens.  Remove soap buildup in the tub or shower regularly.  Attach bath mats securely with double-sided non-slip rug tape.  Do not have throw rugs and other things on the floor that can make you trip. What can I do in the bedroom?  Use night lights.  Make sure that you have a light by your bed that is easy to reach.  Do not use any sheets or blankets that are too big for your bed. They should not hang down onto the floor.  Have a firm chair that has side arms. You can use this for support while you get dressed.  Do not have throw rugs and other things on the floor that can make you trip. What can I do in the kitchen?  Clean up any spills right away.  Avoid walking on wet floors.  Keep items that you use a lot in easy-to-reach places.  If you need to reach something above you, use a strong step stool that has a grab bar.  Keep electrical cords out of the way.  Do not use floor polish or wax that makes floors slippery. If you must use wax, use non-skid  floor wax.  Do not have throw rugs and other things on the floor that can make you trip. What can I do with my stairs?  Do not leave any items on the stairs.  Make sure that there are handrails on both sides of the stairs and use them. Fix handrails that are broken or loose. Make sure that handrails are as long as the stairways.  Check any carpeting to make sure that it is firmly attached to the stairs. Fix any carpet that is loose or worn.  Avoid having throw rugs at the top or bottom of the stairs. If you do have throw rugs, attach them to the floor with carpet tape.  Make sure that you have a light switch at the top of the stairs and the bottom of the stairs. If you do not have them, ask someone to add them for you. What else can I do to help prevent falls?  Wear shoes that:  Do not have high heels.  Have rubber bottoms.  Are comfortable and fit you well.  Are closed at the toe. Do not wear sandals.  If you  use a stepladder:  Make sure that it is fully opened. Do not climb a closed stepladder.  Make sure that both sides of the stepladder are locked into place.  Ask someone to hold it for you, if possible.  Clearly mark and make sure that you can see:  Any grab bars or handrails.  First and last steps.  Where the edge of each step is.  Use tools that help you move around (mobility aids) if they are needed. These include:  Canes.  Walkers.  Scooters.  Crutches.  Turn on the lights when you go into a dark area. Replace any light bulbs as soon as they burn out.  Set up your furniture so you have a clear path. Avoid moving your furniture around.  If any of your floors are uneven, fix them.  If there are any pets around you, be aware of where they are.  Review your medicines with your doctor. Some medicines can make you feel dizzy. This can increase your chance of falling. Ask your doctor what other things that you can do to help prevent falls. This information is not intended to replace advice given to you by your health care provider. Make sure you discuss any questions you have with your health care provider. Document Released: 01/07/2009 Document Revised: 08/19/2015 Document Reviewed: 04/17/2014 Elsevier Interactive Patient Education  2017 [employer].

## 2019-01-13 ENCOUNTER — Ambulatory Visit (HOSPITAL_COMMUNITY): Payer: Medicare Other

## 2019-01-17 ENCOUNTER — Ambulatory Visit (HOSPITAL_COMMUNITY)
Admission: RE | Admit: 2019-01-17 | Discharge: 2019-01-17 | Disposition: A | Discharge status: Home / Self Care | Payer: Medicare Other | Source: Ambulatory Visit | Attending: Family Medicine | Admitting: Family Medicine

## 2019-01-17 ENCOUNTER — Other Ambulatory Visit: Payer: Self-pay

## 2019-01-17 DIAGNOSIS — Z1231 Encounter for screening mammogram for malignant neoplasm of breast: Secondary | ICD-10-CM | POA: Insufficient documentation

## 2019-01-20 ENCOUNTER — Other Ambulatory Visit: Payer: Self-pay

## 2019-01-20 ENCOUNTER — Ambulatory Visit (INDEPENDENT_AMBULATORY_CARE_PROVIDER_SITE_OTHER): Payer: Medicare Other | Admitting: Otolaryngology

## 2019-01-20 DIAGNOSIS — H9311 Tinnitus, right ear: Secondary | ICD-10-CM | POA: Diagnosis not present

## 2019-01-20 DIAGNOSIS — R0982 Postnasal drip: Secondary | ICD-10-CM

## 2019-01-20 DIAGNOSIS — H9209 Otalgia, unspecified ear: Secondary | ICD-10-CM | POA: Diagnosis not present

## 2019-03-03 ENCOUNTER — Other Ambulatory Visit: Payer: Self-pay

## 2019-03-03 ENCOUNTER — Ambulatory Visit (INDEPENDENT_AMBULATORY_CARE_PROVIDER_SITE_OTHER): Payer: Medicare Other | Admitting: Otolaryngology

## 2019-03-12 ENCOUNTER — Ambulatory Visit: Payer: Medicare Other | Admitting: Family Medicine

## 2019-03-13 ENCOUNTER — Other Ambulatory Visit: Payer: Self-pay | Admitting: Family Medicine

## 2019-03-13 ENCOUNTER — Ambulatory Visit: Payer: Medicare Other | Admitting: Rheumatology

## 2019-03-17 ENCOUNTER — Other Ambulatory Visit: Payer: Self-pay | Admitting: Rheumatology

## 2019-03-17 DIAGNOSIS — M3219 Other organ or system involvement in systemic lupus erythematosus: Secondary | ICD-10-CM

## 2019-03-17 NOTE — Telephone Encounter (Signed)
Ok to refill 30 day supply of PLQ.

## 2019-03-17 NOTE — Telephone Encounter (Signed)
Last Visit: 10/22/2018 Next Visit: 04/29/2019 Labs: 10/22/2018 Creatinine is mildly elevated. We will continue to monitor labs. Rest the labs are stable.  Eye exam: 09/12/2016  Advised patient we need updated PLQ eye exam, patient states she had one last month and will call to have it faxed. I advised patient this is very important.   Okay to refill 30 day supply of PLQ?

## 2019-03-25 ENCOUNTER — Ambulatory Visit: Payer: Medicare Other | Admitting: Physician Assistant

## 2019-04-21 NOTE — Progress Notes (Signed)
Virtual Visit via Telephone Note  I connected with [name] on 04/22/19 at  9:00 AM EST by telephone and verified that I am speaking with the correct person using two identifiers.  Location: Patient: Home  Provider: Clinic  This service was conducted via virtual visit.  The patient was located at home. I was located in my office.  Consent was obtained prior to the virtual visit and is aware of possible charges through their insurance for this visit.  The patient is an established patient.  Dr. Estanislado Pandy, MD conducted the virtual visit and Hazel Sams, PA-C acted as scribe during the service.  Office staff helped with scheduling follow up visits after the service was conducted.   I discussed the limitations, risks, security and privacy concerns of performing an evaluation and management service by telephone and the availability of in person appointments. I also discussed with the patient that there may be a patient responsible charge related to this service. The patient expressed understanding and agreed to proceed.   CC: Medication monitoring  History of Present Illness: Patient is a 69 year old female with a past medical history of systemic lupus erythematous, lupus nephritis, and DDD. She is taking plaquenil 200 mg 1 tablet by mouth twice daily M-F. She denies any signs or symptoms of a lupus flare.  She denies any recent rashes.  She denies any joint pain or joint swelling currently.  She has not had any oral or nasal ulcerations.  She has chronic eye dryness and uses eye drops.  She denies any mouth dryness.  She denies any enlarged lymph nodes.  She denies any shortness of breath or palpitations.   Review of Systems  Constitutional: Negative for fever and malaise/fatigue.  HENT: Negative for congestion.   Eyes: Negative for photophobia, discharge and redness.  Respiratory: Negative for cough, shortness of breath and wheezing.   Cardiovascular: Negative for chest pain, palpitations  and leg swelling.  Gastrointestinal: Negative for blood in stool, constipation and diarrhea.  Genitourinary: Negative for dysuria and frequency.  Musculoskeletal: Negative for back pain, joint pain, myalgias and neck pain.  Skin: Negative for rash.  Neurological: Negative for dizziness, weakness and headaches.  Endo/Heme/Allergies: Does not bruise/bleed easily.  Psychiatric/Behavioral: Negative for depression and memory loss. The patient is not nervous/anxious and does not have insomnia.       Observations/Objective: Physical Exam  Constitutional: She is oriented to person, place, and time.  Neurological: She is alert and oriented to person, place, and time.  Psychiatric: Mood, memory, affect and judgment normal.   Patient reports morning stiffness for 0  NONE.   Patient denies nocturnal pain.  Difficulty dressing/grooming: Denies Difficulty climbing stairs: Denies Difficulty getting out of chair: Denies Difficulty using hands for taps, buttons, cutlery, and/or writing: Denies   Assessment and Plan: Visit Diagnoses: Other systemic lupus erythematosus with other organ involvement ([place]) - history of dsDNA +64, Smith + 5.5, RNP+ 6.5, SSA([place]) Negative, SSB ([place]) Negative, presented initially with nephritis, hair loss, arthralgias, dermatitis: She has not had any signs or symptoms of a lupus flare.  She is clinically doing well on plaquenil 200 mg 1 tablet by mouth twice daily M-F.  Labs from 10/22/18 were reviewed: C3 120, C4 24, dsDNA 8, ESR 11.  She has not had any recent rashes, photosensitivity, hair loss, oral or nasal ulcerations, mouth dryness, enlarged lymph nodes, chest pain, or shortness of breath.  She has chronic eye dryness and uses eye drops as needed.  She is overdue to update lab work and her PLQ eye exam. Future orders placed today.  She will schedule an eye exam ASAP. She also needs to schedule a follow up appointment with her nephrologist ASAP.  She was advised to notify us if  she develops any signs or symptoms of a flare.  She will follow up in 3 months.   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  She follows up with her nephrologist every year.  She has not seen her nephrologist recently. She was advised to schedule an appointment ASAP.  High risk medication use - PLQ 200 mg p.o. twice daily Monday through Friday. eye exam: 09/12/2016 per patient.  She is overdue to update PLQ eye exam. She will call her eye doctor today. CBC and CMP were drawn on 10/22/18.  She is overdue to update lab work. She was encouraged to receive the covid-19 vaccination as soon as possible.   Dermatitis -She has not had any recurrences.   History of renal insufficiency -Creatinine was 1.21 and GFR 54 on 10/22/18.  She is overdue to update lab work.  Orders placed today.   DDD (degenerative disc disease), lumbar -She is not having any lower back pain at this time.   Age-related osteoporosis without current pathological fracture -Managed by Dr. Moshe Cipro.  She is on Fosamax 70 mg weekly started in October 2019.  Most recent DEXA on 01/09/2018: showed T-score of -2.8 at AP spine with a -6.7% decrease in BMD compared to 2015 DEXA.   Other medical conditions are listed as follows:   History of hyperlipidemia  History of adenomatous polyp of colon   History of diverticulosis   History of prediabetes   Follow Up Instructions: She will follow up in 3 months.    I discussed the assessment and treatment plan with the patient. The patient was provided an opportunity to ask questions and all were answered. The patient agreed with the plan and demonstrated an understanding of the instructions.   The patient was advised to call back or seek an in-person evaluation if the symptoms worsen or if the condition fails to improve as anticipated.  I provided 25 minutes of non-face-to-face time during this encounter.   Bo Merino, MD    Scribed by-  Hazel Sams, PA-C

## 2019-04-22 ENCOUNTER — Telehealth (INDEPENDENT_AMBULATORY_CARE_PROVIDER_SITE_OTHER): Payer: Medicare PPO | Admitting: Rheumatology

## 2019-04-22 ENCOUNTER — Other Ambulatory Visit: Payer: Self-pay

## 2019-04-22 ENCOUNTER — Encounter: Payer: Self-pay | Admitting: Rheumatology

## 2019-04-22 DIAGNOSIS — Z86010 Personal history of colonic polyps: Secondary | ICD-10-CM | POA: Diagnosis not present

## 2019-04-22 DIAGNOSIS — L309 Dermatitis, unspecified: Secondary | ICD-10-CM | POA: Diagnosis not present

## 2019-04-22 DIAGNOSIS — M3219 Other organ or system involvement in systemic lupus erythematosus: Secondary | ICD-10-CM | POA: Diagnosis not present

## 2019-04-22 DIAGNOSIS — M3214 Glomerular disease in systemic lupus erythematosus: Secondary | ICD-10-CM

## 2019-04-22 DIAGNOSIS — M5136 Other intervertebral disc degeneration, lumbar region: Secondary | ICD-10-CM

## 2019-04-22 DIAGNOSIS — Z79899 Other long term (current) drug therapy: Secondary | ICD-10-CM | POA: Diagnosis not present

## 2019-04-22 DIAGNOSIS — Z8639 Personal history of other endocrine, nutritional and metabolic disease: Secondary | ICD-10-CM | POA: Diagnosis not present

## 2019-04-22 DIAGNOSIS — Z87898 Personal history of other specified conditions: Secondary | ICD-10-CM

## 2019-04-22 DIAGNOSIS — M810 Age-related osteoporosis without current pathological fracture: Secondary | ICD-10-CM

## 2019-04-22 DIAGNOSIS — Z87448 Personal history of other diseases of urinary system: Secondary | ICD-10-CM | POA: Diagnosis not present

## 2019-04-22 DIAGNOSIS — Z8719 Personal history of other diseases of the digestive system: Secondary | ICD-10-CM

## 2019-04-22 DIAGNOSIS — Z862 Personal history of diseases of the blood and blood-forming organs and certain disorders involving the immune mechanism: Secondary | ICD-10-CM

## 2019-04-22 DIAGNOSIS — M8589 Other specified disorders of bone density and structure, multiple sites: Secondary | ICD-10-CM

## 2019-04-23 LAB — COMPLETE METABOLIC PANEL WITH GFR
AG Ratio: 1.2 (calc) (ref 1.0–2.5)
ALT: 19 U/L (ref 6–29)
AST: 23 U/L (ref 10–35)
Albumin: 4 g/dL (ref 3.6–5.1)
Alkaline phosphatase (APISO): 33 U/L — ABNORMAL LOW (ref 37–153)
BUN/Creatinine Ratio: 14 (calc) (ref 6–22)
BUN: 15 mg/dL (ref 7–25)
CO2: 29 mmol/L (ref 20–32)
Calcium: 9.6 mg/dL (ref 8.6–10.4)
Chloride: 103 mmol/L (ref 98–110)
Creat: 1.08 mg/dL — ABNORMAL HIGH (ref 0.50–0.99)
GFR, Est African American: 62 mL/min/{1.73_m2} (ref >=60)
GFR, Est Non African American: 53 mL/min/{1.73_m2} — ABNORMAL LOW (ref >=60)
Globulin: 3.3 g/dL (calc) (ref 1.9–3.7)
Glucose, Bld: 91 mg/dL (ref 65–139)
Potassium: 4.5 mmol/L (ref 3.5–5.3)
Sodium: 140 mmol/L (ref 135–146)
Total Bilirubin: 0.3 mg/dL (ref 0.2–1.2)
Total Protein: 7.3 g/dL (ref 6.1–8.1)

## 2019-04-23 LAB — CBC WITH DIFFERENTIAL/PLATELET
Absolute Monocytes: 550 cells/uL (ref 200–950)
Basophils Absolute: 42 cells/uL (ref 0–200)
Basophils Relative: 1 %
Eosinophils Absolute: 80 cells/uL (ref 15–500)
Eosinophils Relative: 1.9 %
HCT: 38.1 % (ref 35.0–45.0)
Hemoglobin: 12.6 g/dL (ref 11.7–15.5)
Lymphs Abs: 1756 cells/uL (ref 850–3900)
MCH: 28.4 pg (ref 27.0–33.0)
MCHC: 33.1 g/dL (ref 32.0–36.0)
MCV: 85.8 fL (ref 80.0–100.0)
MPV: 12.6 fL — ABNORMAL HIGH (ref 7.5–12.5)
Monocytes Relative: 13.1 %
Neutro Abs: 1772 cells/uL (ref 1500–7800)
Neutrophils Relative %: 42.2 %
Platelets: 147 10*3/uL (ref 140–400)
RBC: 4.44 10*6/uL (ref 3.80–5.10)
RDW: 13.1 % (ref 11.0–15.0)
Total Lymphocyte: 41.8 %
WBC: 4.2 10*3/uL (ref 3.8–10.8)

## 2019-04-23 LAB — URINALYSIS, ROUTINE W REFLEX MICROSCOPIC
Bacteria, UA: NONE SEEN /HPF
Bilirubin Urine: NEGATIVE
Glucose, UA: NEGATIVE
Hgb urine dipstick: NEGATIVE
Hyaline Cast: NONE SEEN /LPF
Ketones, ur: NEGATIVE
Nitrite: NEGATIVE
Protein, ur: NEGATIVE
RBC / HPF: NONE SEEN /HPF (ref 0–2)
Specific Gravity, Urine: 1.01 (ref 1.001–1.03)
Squamous Epithelial / HPF: NONE SEEN /HPF (ref <=5)
pH: 7 (ref 5.0–8.0)

## 2019-04-23 LAB — C3 AND C4
C3 Complement: 120 mg/dL (ref 83–193)
C4 Complement: 22 mg/dL (ref 15–57)

## 2019-04-23 LAB — SEDIMENTATION RATE: Sed Rate: 11 mm/h (ref 0–30)

## 2019-04-23 LAB — ANTI-DNA ANTIBODY, DOUBLE-STRANDED: ds DNA Ab: 8 IU/mL — ABNORMAL HIGH

## 2019-04-23 NOTE — Progress Notes (Signed)
CBC stable. CMP stable. GFR improved and is WNL.  Complements WNL.  ESR WNL.  DsDNA is stable-8. UA revealed +1 leukocytes. Negative nitrites.  If she is experiencing signs or symptoms of a UTI please advise patient to follow up with PCP.

## 2019-04-29 ENCOUNTER — Ambulatory Visit: Payer: Medicare Other | Admitting: Rheumatology

## 2019-05-08 DIAGNOSIS — Z79899 Other long term (current) drug therapy: Secondary | ICD-10-CM | POA: Diagnosis not present

## 2019-05-12 ENCOUNTER — Telehealth: Payer: Self-pay

## 2019-05-12 NOTE — Telephone Encounter (Signed)
Patient has a tiny white bump on the crease of eye lid and is sometimes itchy. Patient scheduled to see NP

## 2019-05-14 ENCOUNTER — Encounter: Payer: Self-pay | Admitting: Family Medicine

## 2019-05-14 ENCOUNTER — Ambulatory Visit (INDEPENDENT_AMBULATORY_CARE_PROVIDER_SITE_OTHER): Payer: Medicare PPO | Admitting: Family Medicine

## 2019-05-14 ENCOUNTER — Other Ambulatory Visit: Payer: Self-pay

## 2019-05-14 VITALS — BP 114/66 | HR 97 | Temp 97.7°F | Resp 15 | Ht 62.0 in | Wt 142.0 lb

## 2019-05-14 DIAGNOSIS — H01004 Unspecified blepharitis left upper eyelid: Secondary | ICD-10-CM

## 2019-05-14 MED ORDER — BACITRACIN-POLYMYXIN B 500-10000 UNIT/GM OP OINT: 1.0000 "application " | TOPICAL_OINTMENT | Freq: Every day | OPHTHALMIC | 0 refills | Status: AC

## 2019-05-14 NOTE — Patient Instructions (Signed)
Happy New Year! May you have a year filled with hope, love, happiness and laughter.  I appreciate the opportunity to provide you with care for your health and wellness. Today we discussed: eye lid itching/infection  Follow up: 10/01/2019 as scheduled  No labs or referrals today  Please use the ointment as directed.  Please wash the eyelid prior to application  Lid Washing: Please wash eye lid twice daily. Use warm water to remove any debris's.  You can use diluted baby shampoo or nonirritating soap to gently wash away any accumulated material or bacteria.  If soap or shampoo is used please make sure you thoroughly rinse to avoid irritation to the eye.  Vigorous washing should be avoided and scrubbing should be avoided.  Please continue to practice social distancing to keep you, your family, and our community safe.  If you must go out, please wear a mask and practice good handwashing.  It was a pleasure to see you and I look forward to continuing to work together on your health and well-being. Please do not hesitate to call the office if you need care or have questions about your care.  Have a wonderful day and week. With Gratitude, Cherly Beach, DNP, AGNP-BC

## 2019-05-14 NOTE — Progress Notes (Signed)
Subjective:  Patient ID: [name], female    DOB: 02-10-1951  Age: 69 y.o. MRN: [id number]  CC:  Chief Complaint  Patient presents with  . small bump on eye lid      HPI  HPI  [name] is a 69 year old female patient of Dr. Griffin Dakin.  Who presents today with small bump on eyelid.  Reports that she went to her eye doctor and her doctor was not available to see her about this they did her eye exam because she is on Plaquenil but reported that she should be seen by her primary care.  She reports that her eyelashes are itchy and very dry.  She reports the bumps that come up are itchy and that they are little white looking.  She reports is been off and on for about a month now she does warm compresses and that helps decrease the size.  She reports that she has a heavy sensation of the eye.  No pain and no changes in eye movement.  Denies having any headache.  Denies having any rashes otherwise.  Today patient denies signs and symptoms of COVID 19 infection including fever, chills, cough, shortness of breath, and headache. Past Medical, Surgical, Social History, Allergies, and Medications have been Reviewed.   Past Medical History:  Diagnosis Date  . Arthritis 02/13/10   abnormal MRI lumbar and c spine  with disc disease  . Osteoporosis   . SLE (systemic lupus erythematosus) ([place]) dx apprx 1990   reports immunotherapy, cytoxan and steroids at [facility], no f/u for over 68yrs    Current Meds  Medication Sig  . alendronate (FOSAMAX) 70 MG tablet TAKE ONE TABLET (70MG  TOTAL) BY MOUTH EVERY 7 DAYS. TAKE WITH A FULL GLASS OF WATER ON AN EMPTY STOMACH.  Marland Kitchen aspirin 81 MG tablet Take 81 mg by mouth daily.  . betamethasone, augmented, (DIPROLENE) 0.05 % lotion Apply 1 application topically 2 (two) times daily.  . calcium gluconate 500 MG tablet Take 1 tablet by mouth daily.  . chlorpheniramine (CHLOR-TRIMETON) 4 MG tablet Take 1 tablet (4 mg total) by mouth 2 (two) times daily as needed  for allergies.  . cyanocobalamin 1000 MCG tablet Take 1,000 mcg by mouth daily.  . hydroxychloroquine (PLAQUENIL) 200 MG tablet TAKE ONE TABLET BY MOUTH TWICE A DAY  . ipratropium (ATROVENT) 0.06 % nasal spray   . loratadine (CLARITIN) 10 MG tablet Take 1 tablet (10 mg total) by mouth daily as needed for allergies.  Marland Kitchen meclizine (ANTIVERT) 12.5 MG tablet Take 1 tablet (12.5 mg total) by mouth 3 (three) times daily as needed for dizziness.  . Multiple Vitamin (MULTIVITAMIN) capsule Take 1 capsule by mouth daily.  Marland Kitchen VITAMIN D PO Take by mouth.  . vitamin E 400 UNIT capsule Take 400 Units by mouth daily.    ROS:  Review of Systems  Constitutional: Negative.   HENT: Negative.   Eyes: Negative.        See hpi    Respiratory: Negative.   Cardiovascular: Negative.   Gastrointestinal: Negative.   Genitourinary: Negative.   Musculoskeletal: Negative.   Skin: Negative.   Neurological: Negative.   Endo/Heme/Allergies: Negative.   Psychiatric/Behavioral: Negative.   All other systems reviewed and are negative.    Objective:   Today's Vitals: BP 114/66   Pulse 97   Temp 97.7 F (36.5 C) (Temporal)   Resp 15   Ht 5\' 2"  (1.575 m)   Wt 142 lb (64.4 kg)  SpO2 95%   BMI 25.97 kg/m  Vitals with BMI 05/14/2019 12/13/2018 11/04/2018  Height 5\' 2"  5\' 2"  5\' 2"   Weight 142 lbs 132 lbs 132 lbs  BMI 25.97 99991111 [id number]  Systolic [id number]  Diastolic 66 80 80  Pulse 97 53 53     Physical Exam Vitals and nursing note reviewed.  Constitutional:      Appearance: Normal appearance. She is well-developed and well-groomed. She is obese.  HENT:     Head: Normocephalic and atraumatic.     Right Ear: External ear normal.     Left Ear: External ear normal.     Mouth/Throat:     Comments: Mask in place  Eyes:     General: Lids are normal.        Right eye: No discharge.        Left eye: No discharge.     Extraocular Movements: Extraocular movements intact.     Conjunctiva/sclera:  Conjunctivae normal.     Comments: Mild swelling and redness of upper Left eyelid Thinning lashes    Cardiovascular:     Rate and Rhythm: Normal rate and regular rhythm.     Pulses: Normal pulses.     Heart sounds: Normal heart sounds.  Pulmonary:     Effort: Pulmonary effort is normal.     Breath sounds: Normal breath sounds.  Musculoskeletal:        General: Normal range of motion.     Cervical back: Normal range of motion and neck supple.  Skin:    General: Skin is warm.  Neurological:     General: No focal deficit present.     Mental Status: She is alert and oriented to person, place, and time.  Psychiatric:        Attention and Perception: Attention normal.        Mood and Affect: Mood normal.        Speech: Speech normal.        Behavior: Behavior normal. Behavior is cooperative.        Thought Content: Thought content normal.        Cognition and Memory: Cognition normal.        Judgment: Judgment normal.     Assessment   1. Blepharitis of left upper eyelid, unspecified type     Tests ordered No orders of the defined types were placed in this encounter.    Plan: Please see assessment and plan per problem list above.   Meds ordered this encounter  Medications  . bacitracin-polymyxin b (POLYSPORIN) ophthalmic ointment    Sig: Place 1 application into the left eye at bedtime for 10 days. Please wash eyelids prior to application    Dispense:  3.5 g    Refill:  0    Order Specific Question:   Supervising Provider    Answer:   Jacklynn Bue    Patient to follow-up in 10/01/2019 .  Perlie Mayo, NP

## 2019-05-14 NOTE — Assessment & Plan Note (Signed)
Treating blepharitis of the left upper eyelid.  Prescribed Polysporin ointment to be applied after lid washing.  Detailed lid washing information provided on AVS today.

## 2019-05-16 ENCOUNTER — Other Ambulatory Visit: Payer: Self-pay | Admitting: Rheumatology

## 2019-05-16 ENCOUNTER — Telehealth: Payer: Self-pay

## 2019-05-16 DIAGNOSIS — M3219 Other organ or system involvement in systemic lupus erythematosus: Secondary | ICD-10-CM

## 2019-05-16 NOTE — Telephone Encounter (Signed)
If we receive record of a normal PLQ eye today we can send in a 90-day supply.

## 2019-05-16 NOTE — Telephone Encounter (Signed)
Pt is calling to see why the difference in the cream was sent in. She had originally Polysporin, but she was called in [place].  Please advise

## 2019-05-16 NOTE — Telephone Encounter (Addendum)
Last Visit: 04/22/19 Next Visit: 07/24/19 Labs: 04/22/19 CBC stable. CMP stable. GFR improved and is WNL.  Plaquenil eye exam normal on 09/12/2016   Patient states she has updated with [facility]. She will contact them and have them send results. Faxed copy of our PLQ eye exam form. Waiting for return fax.  Okay to refill 30 day supply PLQ?

## 2019-05-19 NOTE — Telephone Encounter (Signed)
Called pt to advise, and she was Grateful, and expressed THANKS

## 2019-05-27 DIAGNOSIS — D6862 Lupus anticoagulant syndrome: Secondary | ICD-10-CM | POA: Diagnosis not present

## 2019-05-27 DIAGNOSIS — E559 Vitamin D deficiency, unspecified: Secondary | ICD-10-CM | POA: Diagnosis not present

## 2019-05-27 DIAGNOSIS — Z79899 Other long term (current) drug therapy: Secondary | ICD-10-CM | POA: Diagnosis not present

## 2019-05-30 DIAGNOSIS — M3214 Glomerular disease in systemic lupus erythematosus: Secondary | ICD-10-CM | POA: Diagnosis not present

## 2019-05-30 DIAGNOSIS — N182 Chronic kidney disease, stage 2 (mild): Secondary | ICD-10-CM | POA: Diagnosis not present

## 2019-05-30 DIAGNOSIS — R030 Elevated blood-pressure reading, without diagnosis of hypertension: Secondary | ICD-10-CM | POA: Diagnosis not present

## 2019-05-30 DIAGNOSIS — Z79899 Other long term (current) drug therapy: Secondary | ICD-10-CM | POA: Diagnosis not present

## 2019-06-13 ENCOUNTER — Other Ambulatory Visit: Payer: Self-pay | Admitting: Family Medicine

## 2019-06-16 ENCOUNTER — Other Ambulatory Visit: Payer: Self-pay | Admitting: Family Medicine

## 2019-06-16 ENCOUNTER — Encounter: Payer: Self-pay | Admitting: Family Medicine

## 2019-06-16 DIAGNOSIS — H5789 Other specified disorders of eye and adnexa: Secondary | ICD-10-CM

## 2019-06-17 ENCOUNTER — Other Ambulatory Visit: Payer: Self-pay

## 2019-06-17 ENCOUNTER — Encounter: Payer: Self-pay | Admitting: Family Medicine

## 2019-06-17 ENCOUNTER — Ambulatory Visit (INDEPENDENT_AMBULATORY_CARE_PROVIDER_SITE_OTHER): Payer: Medicare PPO | Admitting: Family Medicine

## 2019-06-17 NOTE — Patient Instructions (Signed)
° ° ° °  If you have lab work done today you will be contacted with your lab results within the next 2 weeks.  If you have not heard from us then please contact us. The fastest way to get your results is to register for My Chart. ° ° °IF you received an x-ray today, you will receive an invoice from [facility]. Please contact [facility] at [phone] with questions or concerns regarding your invoice.  ° °IF you received labwork today, you will receive an invoice from [facility]. Please contact [facility] at [phone] with questions or concerns regarding your invoice.  ° °Our billing staff will not be able to assist you with questions regarding bills from these companies. ° °You will be contacted with the lab results as soon as they are available. The fastest way to get your results is to activate your My Chart account. Instructions are located on the last page of this paperwork. If you have not heard from us regarding the results in 2 weeks, please contact this office. °  ° ° ° °

## 2019-06-19 DIAGNOSIS — H1013 Acute atopic conjunctivitis, bilateral: Secondary | ICD-10-CM | POA: Diagnosis not present

## 2019-06-19 DIAGNOSIS — H353131 Nonexudative age-related macular degeneration, bilateral, early dry stage: Secondary | ICD-10-CM | POA: Diagnosis not present

## 2019-06-19 DIAGNOSIS — H04123 Dry eye syndrome of bilateral lacrimal glands: Secondary | ICD-10-CM | POA: Diagnosis not present

## 2019-06-19 DIAGNOSIS — H35363 Drusen (degenerative) of macula, bilateral: Secondary | ICD-10-CM | POA: Diagnosis not present

## 2019-06-19 DIAGNOSIS — H25813 Combined forms of age-related cataract, bilateral: Secondary | ICD-10-CM | POA: Diagnosis not present

## 2019-06-19 DIAGNOSIS — H35453 Secondary pigmentary degeneration, bilateral: Secondary | ICD-10-CM | POA: Diagnosis not present

## 2019-07-02 ENCOUNTER — Other Ambulatory Visit: Payer: Self-pay | Admitting: Rheumatology

## 2019-07-02 DIAGNOSIS — M3219 Other organ or system involvement in systemic lupus erythematosus: Secondary | ICD-10-CM

## 2019-07-03 NOTE — Telephone Encounter (Signed)
Last Visit: 04/22/19 Next Visit: 07/24/19 Labs: 04/22/19 CBC stable. CMP stable. GFR improved and is WNL.  Plaquenil eye exam normal on 05/08/2019  Current Dose per office note on 04/22/19:  plaquenil 200 mg 1 tablet by mouth twice daily M-F  Okay to refill per Dr. Estanislado Pandy

## 2019-07-23 NOTE — Progress Notes (Signed)
Office Visit Note  Patient: [name]             Date of Birth: 04/16/1950           MRN: [id number]             PCP: [name], MD Referring: [name], MD Visit Date: 07/24/2019 Occupation: @GUAROCC @  Subjective:  Pain in right hand and left arm.   History of Present Illness: [name] is a 69 y.o. female with with systemic lupus, nephritis and DDD.  She states she has been seeing her nephrologist on a regular basis and everything is stable.  She denies any history of oral ulcers, nasal ulcers, Raynaud's phenomenon, photosensitivity or lymphadenopathy.  She states she has been having some discomfort in her right hand which she describes over the right fifth PIP joint.  She been also having some discomfort in the left biceps.  It has  been going on for about 2 months.  Activities of Daily Living:  Patient reports morning stiffness for 0 minute.   Patient Denies nocturnal pain.  Difficulty dressing/grooming: Denies Difficulty climbing stairs: Denies Difficulty getting out of chair: Denies Difficulty using hands for taps, buttons, cutlery, and/or writing: Denies  Review of Systems  Constitutional: Negative for fatigue, night sweats, weight gain and weight loss.  HENT: Positive for mouth dryness. Negative for mouth sores, trouble swallowing, trouble swallowing and nose dryness.   Eyes: Negative for pain, redness, visual disturbance and dryness.  Respiratory: Negative for cough, shortness of breath and difficulty breathing.   Cardiovascular: Negative for chest pain, palpitations, hypertension, irregular heartbeat and swelling in legs/feet.  Gastrointestinal: Negative for blood in stool, constipation and diarrhea.  Endocrine: Negative for increased urination.  Genitourinary: Negative for vaginal dryness.  Musculoskeletal: Positive for arthralgias, joint pain, myalgias and myalgias. Negative for joint swelling, muscle weakness, morning stiffness and  muscle tenderness.  Skin: Negative for color change, rash, hair loss, skin tightness, ulcers and sensitivity to sunlight.  Allergic/Immunologic: Negative for susceptible to infections.  Neurological: Negative for dizziness, memory loss, night sweats and weakness.  Hematological: Negative for swollen glands.  Psychiatric/Behavioral: Negative for depressed mood and sleep disturbance. The patient is not nervous/anxious.     PMFS History:  Patient Active Problem List   Diagnosis Date Noted  . Blepharitis of left upper eyelid 05/14/2019  . Impacted cerumen of right ear 11/04/2018  . Right otitis media 11/04/2018  . Allergic sinusitis 11/04/2018  . Vertigo 11/04/2018  . Need for 23-polyvalent pneumococcal polysaccharide vaccine 09/30/2018  . Lupus nephritis ([place]) 1999-2000  09/15/2016  . High risk medication use 09/15/2016  . DDD (degenerative disc disease), lumbar 09/15/2016  . History of hyperlipidemia 09/15/2016  . History of anemia 09/15/2016  . History of proteinuria  09/15/2016  . Dermatitis of face 09/15/2016  . Hair loss 09/15/2016  . Tubular adenoma of colon 04/30/2016  . Prediabetes 11/15/2014  . Annual physical exam 01/06/2014  . Osteopenia 07/21/2013  . SLE (systemic lupus erythematosus) ([place]) 02/15/2012  . History of diverticulosis 10/01/2007    Past Medical History:  Diagnosis Date  . Arthritis 02/13/10   abnormal MRI lumbar and c spine  with disc disease  . Osteoporosis   . SLE (systemic lupus erythematosus) ([place]) dx apprx 1990   reports immunotherapy, cytoxan and steroids at [facility], no f/u for over 83yrs    Family History  Problem Relation Age of Onset  . Heart disease Father   . Cancer Brother  48       breast  . Cancer Sister 63       breast  . Colon cancer Neg Hx    Past Surgical History:  Procedure Laterality Date  . ABDOMINAL HYSTERECTOMY  unsure, over 10 yrs   partial, has had abnormal pap  . BLADDER REPAIR    . COLONOSCOPY N/A 01/05/2014    Procedure: COLONOSCOPY;  Surgeon: Daneil Dolin, MD;  Location[facility];  Service: Endoscopy;  Laterality: N/A;  12:15 PM   Social History   Social History Narrative  . Not on file   Immunization History  Administered Date(s) Administered  . Fluad Quad(high Dose 65+) 11/18/2018  . Influenza Split 02/14/2012  . Influenza Whole 12/28/2000  . Influenza,inj,Quad PF,6+ Mos 12/16/2012, 01/06/2014, 01/19/2016, 12/10/2017  . Pneumococcal Conjugate-13 09/25/2017  . Pneumococcal Polysaccharide-23 09/30/2018  . Td 09/17/2003  . Tdap 01/06/2014  . Zoster 12/17/2012     Objective: Vital Signs: BP 111/73 (BP Location: Left Arm, Patient Position: Sitting, Cuff Size: Normal)   Pulse 66   Resp 18   Ht 5\' 2"  (1.575 m)   Wt 141 lb 12.8 oz (64.3 kg)   BMI 25.94 kg/m    Physical Exam Vitals and nursing note reviewed.  Constitutional:      Appearance: She is well-developed.  HENT:     Head: Normocephalic and atraumatic.  Eyes:     Conjunctiva/sclera: Conjunctivae normal.  Cardiovascular:     Rate and Rhythm: Normal rate and regular rhythm.     Heart sounds: Normal heart sounds.  Pulmonary:     Effort: Pulmonary effort is normal.     Breath sounds: Normal breath sounds.  Abdominal:     General: Bowel sounds are normal.     Palpations: Abdomen is soft.  Musculoskeletal:     Cervical back: Normal range of motion.  Lymphadenopathy:     Cervical: No cervical adenopathy.  Skin:    General: Skin is warm and dry.     Capillary Refill: Capillary refill takes less than 2 seconds.  Neurological:     Mental Status: She is alert and oriented to person, place, and time.  Psychiatric:        Behavior: Behavior normal.      Musculoskeletal Exam: C-spine thoracic and lumbar spine with good range of motion.  Shoulder joints, elbow joints, wrist joints, MCPs and PIPs with good range of motion.  She has hyperextension of her right fifth PIP joint but no synovitis was noted.  There was no  tenderness over the left biceps today.  Hip joints, knee joints, ankles, MTPs and PIPs with good range of motion with no synovitis.  She had some osteoarthritic changes in her feet.  CDAI Exam: CDAI Score: -- Patient Global: --; Provider Global: -- Swollen: --; Tender: -- Joint Exam 07/24/2019   No joint exam has been documented for this visit   There is currently no information documented on the homunculus. Go to the Rheumatology activity and complete the homunculus joint exam.  Investigation: No additional findings.  Imaging: No results found.  Recent Labs: Lab Results  Component Value Date   WBC 4.2 04/22/2019   HGB 12.6 04/22/2019   PLT 147 04/22/2019   NA 140 04/22/2019   K 4.5 04/22/2019   CL 103 04/22/2019   CO2 29 04/22/2019   GLUCOSE 91 04/22/2019   BUN 15 04/22/2019   CREATININE 1.08 (H) 04/22/2019   BILITOT 0.3 04/22/2019   ALKPHOS 43 09/18/2016  AST 23 04/22/2019   ALT 19 04/22/2019   PROT 7.3 04/22/2019   ALBUMIN 3.8 09/18/2016   CALCIUM 9.6 04/22/2019   GFRAA 62 04/22/2019    Speciality Comments: Plaquenil eye exam normal on 05/08/2019 per my eye doctor in [place]  Procedures:  No procedures performed Allergies: Sulfa antibiotics and Sulfonamide derivatives   Assessment / Plan:     Visit Diagnoses: Other systemic lupus erythematosus with other organ involvement ([place]) -  history of dsDNA +64, Smith + 5.5, RNP+ 6.5, SSA([place]) Negative, SSB ([place]) Negative, presented initially with nephritis, hair loss, arthralgias, dermatitis -she is clinically doing well.  She had no synovitis on examination today.  There have been no episodes of joint swelling, rash, Raynaud's, oral ulcers she has mild sicca symptoms.  Plan: Urinalysis, Routine w reflex microscopic, Anti-DNA antibody, double-stranded, C3 and C4, Sedimentation rate  Lupus nephritis ([place]) 1999-2000 -  treated with Cytoxan IV and prednisone for 1 year.  She follows up with her nephrologist every year.   Patient states she had recent nephrology visit which was stable.  High risk medication use - PLQ 200 mg p.o. twice daily Monday through Friday - Plan: CBC with Differential/Platelet, COMPLETE METABOLIC PANEL WITH GFR today and then every 3 months.  Her last eye examination was on May 08, 2019.  Dermatitis-she has not had any recent rash.  History of renal insufficiency-followed by nephrology.  DDD (degenerative disc disease), lumbar-she is currently not having much discomfort.  Age-related osteoporosis without current pathological fracture - -Managed by Dr. Moshe Cipro.  She is on Fosamax 70 mg weekly started in October 2019.  Most recent DEXA on 01/09/2018: showed T-score of -2.8 at [facility] with a -6.  Other medical problems are listed as follows:  History of hyperlipidemia  History of adenomatous polyp of colon  History of diverticulosis  History of proteinuria syndrome  History of prediabetes  History of anemia  Orders: Orders Placed This Encounter  Procedures  . CBC with Differential/Platelet  . COMPLETE METABOLIC PANEL WITH GFR  . Urinalysis, Routine w reflex microscopic  . Anti-DNA antibody, double-stranded  . C3 and C4  . Sedimentation rate   No orders of the defined types were placed in this encounter.     Follow-Up Instructions: Return in about 3 months (around 10/23/2019) for Systemic lupus.   Bo Merino, MD  Note - This record has been created using Editor, commissioning.  Chart creation errors have been sought, but may not always  have been located. Such creation errors do not reflect on  the standard of medical care.

## 2019-07-24 ENCOUNTER — Encounter: Payer: Self-pay | Admitting: Rheumatology

## 2019-07-24 ENCOUNTER — Other Ambulatory Visit: Payer: Self-pay

## 2019-07-24 ENCOUNTER — Ambulatory Visit: Payer: Medicare PPO | Admitting: Rheumatology

## 2019-07-24 VITALS — BP 111/73 | HR 66 | Resp 18 | Ht 62.0 in | Wt 141.8 lb

## 2019-07-24 DIAGNOSIS — Z87898 Personal history of other specified conditions: Secondary | ICD-10-CM

## 2019-07-24 DIAGNOSIS — M810 Age-related osteoporosis without current pathological fracture: Secondary | ICD-10-CM

## 2019-07-24 DIAGNOSIS — Z86010 Personal history of colonic polyps: Secondary | ICD-10-CM | POA: Diagnosis not present

## 2019-07-24 DIAGNOSIS — Z87448 Personal history of other diseases of urinary system: Secondary | ICD-10-CM | POA: Diagnosis not present

## 2019-07-24 DIAGNOSIS — L309 Dermatitis, unspecified: Secondary | ICD-10-CM

## 2019-07-24 DIAGNOSIS — M3214 Glomerular disease in systemic lupus erythematosus: Secondary | ICD-10-CM

## 2019-07-24 DIAGNOSIS — M3219 Other organ or system involvement in systemic lupus erythematosus: Secondary | ICD-10-CM

## 2019-07-24 DIAGNOSIS — M5136 Other intervertebral disc degeneration, lumbar region: Secondary | ICD-10-CM

## 2019-07-24 DIAGNOSIS — Z79899 Other long term (current) drug therapy: Secondary | ICD-10-CM | POA: Diagnosis not present

## 2019-07-24 DIAGNOSIS — Z8639 Personal history of other endocrine, nutritional and metabolic disease: Secondary | ICD-10-CM

## 2019-07-24 DIAGNOSIS — Z8719 Personal history of other diseases of the digestive system: Secondary | ICD-10-CM

## 2019-07-24 DIAGNOSIS — Z862 Personal history of diseases of the blood and blood-forming organs and certain disorders involving the immune mechanism: Secondary | ICD-10-CM

## 2019-07-25 LAB — URINALYSIS, ROUTINE W REFLEX MICROSCOPIC
Bilirubin Urine: NEGATIVE
Glucose, UA: NEGATIVE
Hgb urine dipstick: NEGATIVE
Ketones, ur: NEGATIVE
Leukocytes,Ua: NEGATIVE
Nitrite: NEGATIVE
Protein, ur: NEGATIVE
Specific Gravity, Urine: 1.011 (ref 1.001–1.03)
pH: 6 (ref 5.0–8.0)

## 2019-07-25 LAB — CBC WITH DIFFERENTIAL/PLATELET
Absolute Monocytes: 615 cells/uL (ref 200–950)
Basophils Absolute: 51 cells/uL (ref 0–200)
Basophils Relative: 1.5 %
Eosinophils Absolute: 51 cells/uL (ref 15–500)
Eosinophils Relative: 1.5 %
HCT: 40.3 % (ref 35.0–45.0)
Hemoglobin: 12.6 g/dL (ref 11.7–15.5)
Lymphs Abs: 1302 cells/uL (ref 850–3900)
MCH: 27.3 pg (ref 27.0–33.0)
MCHC: 31.3 g/dL — ABNORMAL LOW (ref 32.0–36.0)
MCV: 87.4 fL (ref 80.0–100.0)
MPV: 12.8 fL — ABNORMAL HIGH (ref 7.5–12.5)
Monocytes Relative: 18.1 %
Neutro Abs: 1380 cells/uL — ABNORMAL LOW (ref 1500–7800)
Neutrophils Relative %: 40.6 %
Platelets: 160 10*3/uL (ref 140–400)
RBC: 4.61 10*6/uL (ref 3.80–5.10)
RDW: 13.6 % (ref 11.0–15.0)
Total Lymphocyte: 38.3 %
WBC: 3.4 10*3/uL — ABNORMAL LOW (ref 3.8–10.8)

## 2019-07-25 LAB — COMPLETE METABOLIC PANEL WITH GFR
AG Ratio: 1.3 (calc) (ref 1.0–2.5)
ALT: 20 U/L (ref 6–29)
AST: 25 U/L (ref 10–35)
Albumin: 4.1 g/dL (ref 3.6–5.1)
Alkaline phosphatase (APISO): 32 U/L — ABNORMAL LOW (ref 37–153)
BUN/Creatinine Ratio: 17 (calc) (ref 6–22)
BUN: 21 mg/dL (ref 7–25)
CO2: 29 mmol/L (ref 20–32)
Calcium: 9.6 mg/dL (ref 8.6–10.4)
Chloride: 105 mmol/L (ref 98–110)
Creat: 1.23 mg/dL — ABNORMAL HIGH (ref 0.50–0.99)
GFR, Est African American: 53 mL/min/{1.73_m2} — ABNORMAL LOW (ref >=60)
GFR, Est Non African American: 45 mL/min/{1.73_m2} — ABNORMAL LOW (ref >=60)
Globulin: 3.1 g/dL (calc) (ref 1.9–3.7)
Glucose, Bld: 71 mg/dL (ref 65–99)
Potassium: 4.6 mmol/L (ref 3.5–5.3)
Sodium: 141 mmol/L (ref 135–146)
Total Bilirubin: 0.4 mg/dL (ref 0.2–1.2)
Total Protein: 7.2 g/dL (ref 6.1–8.1)

## 2019-07-25 LAB — C3 AND C4
C3 Complement: 125 mg/dL (ref 83–193)
C4 Complement: 24 mg/dL (ref 15–57)

## 2019-07-25 LAB — ANTI-DNA ANTIBODY, DOUBLE-STRANDED: ds DNA Ab: 8 IU/mL — ABNORMAL HIGH

## 2019-07-25 LAB — SEDIMENTATION RATE: Sed Rate: 11 mm/h (ref 0–30)

## 2019-07-28 NOTE — Progress Notes (Signed)
Mild neutropenia noted.  All other labs are stable.  Continue current treatment.

## 2019-08-04 ENCOUNTER — Other Ambulatory Visit: Payer: Self-pay | Admitting: Rheumatology

## 2019-08-04 DIAGNOSIS — M3219 Other organ or system involvement in systemic lupus erythematosus: Secondary | ICD-10-CM

## 2019-08-04 NOTE — Telephone Encounter (Signed)
Last Visit: 07/24/2019 Next Visit: 10/23/2019 Labs: 07/24/2019 Mild neutropenia noted. All other labs are stable. Continue current treatment. Eye exam: 05/08/2019   Okay to refill per Dr. Estanislado Pandy.

## 2019-08-29 DIAGNOSIS — M3210 Systemic lupus erythematosus, organ or system involvement unspecified: Secondary | ICD-10-CM | POA: Diagnosis not present

## 2019-08-29 DIAGNOSIS — Z79899 Other long term (current) drug therapy: Secondary | ICD-10-CM | POA: Diagnosis not present

## 2019-08-29 DIAGNOSIS — E559 Vitamin D deficiency, unspecified: Secondary | ICD-10-CM | POA: Diagnosis not present

## 2019-08-29 DIAGNOSIS — D649 Anemia, unspecified: Secondary | ICD-10-CM | POA: Diagnosis not present

## 2019-08-29 DIAGNOSIS — R809 Proteinuria, unspecified: Secondary | ICD-10-CM | POA: Diagnosis not present

## 2019-09-03 DIAGNOSIS — Z79899 Other long term (current) drug therapy: Secondary | ICD-10-CM | POA: Diagnosis not present

## 2019-09-03 DIAGNOSIS — N1831 Chronic kidney disease, stage 3a: Secondary | ICD-10-CM | POA: Diagnosis not present

## 2019-09-03 DIAGNOSIS — M3214 Glomerular disease in systemic lupus erythematosus: Secondary | ICD-10-CM | POA: Diagnosis not present

## 2019-09-03 DIAGNOSIS — R030 Elevated blood-pressure reading, without diagnosis of hypertension: Secondary | ICD-10-CM | POA: Diagnosis not present

## 2019-09-08 ENCOUNTER — Other Ambulatory Visit: Payer: Self-pay | Admitting: Family Medicine

## 2019-10-01 ENCOUNTER — Ambulatory Visit (INDEPENDENT_AMBULATORY_CARE_PROVIDER_SITE_OTHER): Payer: Medicare PPO | Admitting: Family Medicine

## 2019-10-01 ENCOUNTER — Encounter: Payer: Self-pay | Admitting: Family Medicine

## 2019-10-01 ENCOUNTER — Other Ambulatory Visit: Payer: Self-pay

## 2019-10-01 VITALS — BP 125/79 | HR 70 | Temp 98.8°F | Resp 16 | Ht 62.0 in | Wt 142.0 lb

## 2019-10-01 DIAGNOSIS — R7303 Prediabetes: Secondary | ICD-10-CM

## 2019-10-01 DIAGNOSIS — Z0000 Encounter for general adult medical examination without abnormal findings: Secondary | ICD-10-CM

## 2019-10-01 DIAGNOSIS — D126 Benign neoplasm of colon, unspecified: Secondary | ICD-10-CM

## 2019-10-01 DIAGNOSIS — E559 Vitamin D deficiency, unspecified: Secondary | ICD-10-CM

## 2019-10-01 DIAGNOSIS — Z8639 Personal history of other endocrine, nutritional and metabolic disease: Secondary | ICD-10-CM

## 2019-10-01 DIAGNOSIS — Z780 Asymptomatic menopausal state: Secondary | ICD-10-CM

## 2019-10-01 DIAGNOSIS — R7989 Other specified abnormal findings of blood chemistry: Secondary | ICD-10-CM

## 2019-10-01 DIAGNOSIS — Z13220 Encounter for screening for lipoid disorders: Secondary | ICD-10-CM

## 2019-10-01 LAB — POCT GLYCOSYLATED HEMOGLOBIN (HGB A1C): Hemoglobin A1C: 5.9 % — AB (ref 4.0–5.6)

## 2019-10-01 NOTE — Assessment & Plan Note (Signed)

## 2019-10-01 NOTE — Patient Instructions (Addendum)
F/U with MD in 6 months, call if you need me sooner  Glyco Hb in office   Please schedule at checkout dexa and mammogram, both due end October at checkout Please get fasting lipid, TSH and vit D this Friday or early next week  It is important that you exercise regularly at least 30 minutes 5 times a week. If you develop chest pain, have severe difficulty breathing, or feel very tired, stop exercising immediately and seek medical attention    Think about what you will eat, plan ahead. Choose " clean, green, fresh or frozen" over canned, processed or packaged foods which are more sugary, salty and fatty. 70 to 75% of food eaten should be vegetables and fruit. Three meals at set times with snacks allowed between meals, but they must be fruit or vegetables. Aim to eat over a 12 hour period , example 7 am to 7 pm, and STOP after  your last meal of the day. Drink water,generally about 64 ounces per day, no other drink is as healthy. Fruit juice is best enjoyed in a healthy way, by EATING the fruit.  Thanks for choosing [facility], we consider it a privelige to serve you.

## 2019-10-01 NOTE — Assessment & Plan Note (Signed)
Updated lab needed  Patient educated about the importance of limiting  Carbohydrate intake , the need to commit to daily physical activity for a minimum of 30 minutes , and to commit weight loss. The fact that changes in all these areas will reduce or eliminate all together the development of diabetes is stressed.   Diabetic Labs Latest Ref Rng & Units 07/24/2019 04/22/2019 10/22/2018 10/04/2018 05/17/2018  HbA1c <5.7 % of total Hgb - - - 6.2(H) -  Chol <200 mg/dL - - - 183 -  HDL > OR = 50 mg/dL - - - 37(L) -  Calc LDL mg/dL (calc) - - - 124(H) -  Triglycerides <150 mg/dL - - - 112 -  Creatinine 0.50 - 0.99 mg/dL 1.23(H) 1.08(H) 1.21(H) - 1.11(H)   BP/Weight 10/01/2019 07/24/2019 06/17/2019 05/14/2019 12/13/2018 11/04/2018 08/14/7469  Systolic BP 595 396 728 979 150 413 643  Diastolic BP 79 73 74 66 80 80 81  Wt. (Lbs) 142.04 141.8 142.8 142 132 132 132.6  BMI 25.98 25.94 26.12 25.97 24.14 24.14 24.25   No flowsheet data found.  Marland Kitchen

## 2019-10-01 NOTE — Progress Notes (Addendum)
    [name]     MRN: [id number]      DOB: 12-12-1950  HPI: Patient is in for annual physical exam. No other health concerns are expressed or addressed at the visit. Recent labs, if available are reviewed. Immunization is reviewed , and  updated if needed.   PE: BP 125/79   Pulse 70   Temp 98.8 F (37.1 C)   Resp 16   Ht 5\' 2"  (1.575 m)   Wt 142 lb 0.6 oz (64.4 kg)   SpO2 97%   BMI 25.98 kg/m   Pleasant  female, alert and oriented x 3, in no cardio-pulmonary distress. Afebrile. HEENT No facial trauma or asymetry. Sinuses non tender.  Extra occullar muscles intact.. External ears normal, . Neck: supple, no adenopathy,JVD or thyromegaly.No bruits.  Chest: Clear to ascultation bilaterally.No crackles or wheezes. Non tender to palpation  Breast:  not examined  Cardiovascular system; Heart sounds normal,  S1 and  S2 ,no S3.  No murmur, or thrill. Apical beat not displaced Peripheral pulses normal.  Abdomen: Soft, non tender, no organomegaly or masses. No bruits. Bowel sounds normal. No guarding, tenderness or rebound.   GU: Asymptomatic, not examined  Musculoskeletal exam: Full ROM of spine, hips , shoulders and knees. No deformity ,swelling or crepitus noted. No muscle wasting or atrophy.   Neurologic: Cranial nerves 2 to 12 intact. Power, tone ,sensation and reflexes normal throughout. No disturbance in gait. No tremor.  Skin: Intact, no ulceration, erythema , scaling or rash noted. Pigmentation normal throughout  Psych; Normal mood and affect. Judgement and concentration normal   Assessment & Plan:  Annual physical exam Annual exam as documented. Counseling done  re healthy lifestyle involving commitment to 150 minutes exercise per week, heart healthy diet, and attaining healthy weight.The importance of adequate sleep also discussed. Regular seat belt use and home safety, is also discussed. Changes in health habits are decided on by the  patient with goals and time frames  set for achieving them. Immunization and cancer screening needs are specifically addressed at this visit.   Prediabetes Updated lab needed  Patient educated about the importance of limiting  Carbohydrate intake , the need to commit to daily physical activity for a minimum of 30 minutes , and to commit weight loss. The fact that changes in all these areas will reduce or eliminate all together the development of diabetes is stressed.   Diabetic Labs Latest Ref Rng & Units 07/24/2019 04/22/2019 10/22/2018 10/04/2018 05/17/2018  HbA1c <5.7 % of total Hgb - - - 6.2(H) -  Chol <200 mg/dL - - - 183 -  HDL > OR = 50 mg/dL - - - 37(L) -  Calc LDL mg/dL (calc) - - - 124(H) -  Triglycerides <150 mg/dL - - - 112 -  Creatinine 0.50 - 0.99 mg/dL 1.23(H) 1.08(H) 1.21(H) - 1.11(H)   BP/Weight 10/01/2019 07/24/2019 06/17/2019 05/14/2019 12/13/2018 11/04/2018 7/61/6073  Systolic BP 710 626 948 546 270 350 093  Diastolic BP 79 73 74 66 80 80 81  Wt. (Lbs) 142.04 141.8 142.8 142 132 132 132.6  BMI 25.98 25.94 26.12 25.97 24.14 24.14 24.25   No flowsheet data found.  Marland Kitchen

## 2019-10-10 NOTE — Progress Notes (Deleted)
Office Visit Note  Patient: [name]             Date of Birth: 04-21-1950           MRN: [id number]             PCP: [name], MD Referring: [name], MD Visit Date: 10/23/2019 Occupation: @GUAROCC @  Subjective:  No chief complaint on file.   History of Present Illness: [name] is a 69 y.o. female ***   Activities of Daily Living:  Patient reports morning stiffness for *** {minute/hour:19697}.   Patient {ACTIONS;DENIES/REPORTS:21021675::"Denies"} nocturnal pain.  Difficulty dressing/grooming: {ACTIONS;DENIES/REPORTS:21021675::"Denies"} Difficulty climbing stairs: {ACTIONS;DENIES/REPORTS:21021675::"Denies"} Difficulty getting out of chair: {ACTIONS;DENIES/REPORTS:21021675::"Denies"} Difficulty using hands for taps, buttons, cutlery, and/or writing: {ACTIONS;DENIES/REPORTS:21021675::"Denies"}  No Rheumatology ROS completed.   PMFS History:  Patient Active Problem List   Diagnosis Date Noted  . Allergic sinusitis 11/04/2018  . Lupus nephritis ([place]) 1999-2000  09/15/2016  . High risk medication use 09/15/2016  . DDD (degenerative disc disease), lumbar 09/15/2016  . History of hyperlipidemia 09/15/2016  . History of anemia 09/15/2016  . History of proteinuria  09/15/2016  . Dermatitis of face 09/15/2016  . Tubular adenoma of colon 04/30/2016  . Prediabetes 11/15/2014  . Annual physical exam 01/06/2014  . SLE (systemic lupus erythematosus) ([place]) 02/15/2012  . History of diverticulosis 10/01/2007    Past Medical History:  Diagnosis Date  . Arthritis 02/13/10   abnormal MRI lumbar and c spine  with disc disease  . Osteoporosis   . SLE (systemic lupus erythematosus) ([place]) dx apprx 1990   reports immunotherapy, cytoxan and steroids at [facility], no f/u for over 13yrs    Family History  Problem Relation Age of Onset  . Heart disease Father   . Cancer Brother 85       breast  . Cancer Sister 78       breast  . Colon cancer Neg Hx     Past Surgical History:  Procedure Laterality Date  . ABDOMINAL HYSTERECTOMY  unsure, over 10 yrs   partial, has had abnormal pap  . BLADDER REPAIR    . COLONOSCOPY N/A 01/05/2014   Procedure: COLONOSCOPY;  Surgeon: Daneil Dolin, MD;  [facility];  Service: Endoscopy;  Laterality: N/A;  12:15 PM   Social History   Social History Narrative  . Not on file   Immunization History  Administered Date(s) Administered  . Fluad Quad(high Dose 65+) 11/18/2018  . Influenza Split 02/14/2012  . Influenza Whole 12/28/2000  . Influenza,inj,Quad PF,6+ Mos 12/16/2012, 01/06/2014, 01/19/2016, 12/10/2017  . Moderna SARS-COVID-2 Vaccination 05/02/2019, 05/31/2019  . Pneumococcal Conjugate-13 09/25/2017  . Pneumococcal Polysaccharide-23 09/30/2018  . Td 09/17/2003  . Tdap 01/06/2014  . Zoster 12/17/2012     Objective: Vital Signs: There were no vitals taken for this visit.   Physical Exam   Musculoskeletal Exam: ***  CDAI Exam: CDAI Score: -- Patient Global: --; Provider Global: -- Swollen: --; Tender: -- Joint Exam 10/23/2019   No joint exam has been documented for this visit   There is currently no information documented on the homunculus. Go to the Rheumatology activity and complete the homunculus joint exam.  Investigation: No additional findings.  Imaging: No results found.  Recent Labs: Lab Results  Component Value Date   WBC 3.4 (L) 07/24/2019   HGB 12.6 07/24/2019   PLT 160 07/24/2019   NA 141 07/24/2019   K 4.6 07/24/2019   CL 105 07/24/2019  CO2 29 07/24/2019   GLUCOSE 71 07/24/2019   BUN 21 07/24/2019   CREATININE 1.23 (H) 07/24/2019   BILITOT 0.4 07/24/2019   ALKPHOS 43 09/18/2016   AST 25 07/24/2019   ALT 20 07/24/2019   PROT 7.2 07/24/2019   ALBUMIN 3.8 09/18/2016   CALCIUM 9.6 07/24/2019   GFRAA 53 (L) 07/24/2019    Speciality Comments: Plaquenil eye exam normal on 05/08/2019 per my eye doctor in [place]  Procedures:  No  procedures performed Allergies: Sulfa antibiotics and Sulfonamide derivatives   Assessment / Plan:     Visit Diagnoses: No diagnosis found.  Orders: No orders of the defined types were placed in this encounter.  No orders of the defined types were placed in this encounter.   Face-to-face time spent with patient was *** minutes. Greater than 50% of time was spent in counseling and coordination of care.  Follow-Up Instructions: No follow-ups on file.   Earnestine Mealing, CMA  Note - This record has been created using Editor, commissioning.  Chart creation errors have been sought, but may not always  have been located. Such creation errors do not reflect on  the standard of medical care.

## 2019-10-13 DIAGNOSIS — R7989 Other specified abnormal findings of blood chemistry: Secondary | ICD-10-CM | POA: Diagnosis not present

## 2019-10-13 DIAGNOSIS — Z13220 Encounter for screening for lipoid disorders: Secondary | ICD-10-CM | POA: Diagnosis not present

## 2019-10-13 DIAGNOSIS — Z8639 Personal history of other endocrine, nutritional and metabolic disease: Secondary | ICD-10-CM | POA: Diagnosis not present

## 2019-10-13 DIAGNOSIS — E559 Vitamin D deficiency, unspecified: Secondary | ICD-10-CM | POA: Diagnosis not present

## 2019-10-13 LAB — LIPID PANEL
Cholesterol: 163 mg/dL (ref <200)
HDL: 33 mg/dL — ABNORMAL LOW (ref >=50)
LDL Cholesterol (Calc): 100 mg/dL (calc) — ABNORMAL HIGH
Non-HDL Cholesterol (Calc): 130 mg/dL (calc) — ABNORMAL HIGH (ref <130)
Total CHOL/HDL Ratio: 4.9 (calc) (ref <5.0)
Triglycerides: 179 mg/dL — ABNORMAL HIGH (ref <150)

## 2019-10-13 LAB — VITAMIN D 25 HYDROXY (VIT D DEFICIENCY, FRACTURES): Vit D, 25-Hydroxy: 43 ng/mL (ref 30–100)

## 2019-10-13 LAB — TSH: TSH: 2.73 mIU/L (ref 0.40–4.50)

## 2019-10-21 ENCOUNTER — Other Ambulatory Visit: Payer: Self-pay | Admitting: Family Medicine

## 2019-10-21 DIAGNOSIS — Z1231 Encounter for screening mammogram for malignant neoplasm of breast: Secondary | ICD-10-CM

## 2019-10-23 ENCOUNTER — Ambulatory Visit: Payer: Medicare PPO | Admitting: Rheumatology

## 2019-11-10 ENCOUNTER — Other Ambulatory Visit: Payer: Self-pay | Admitting: Rheumatology

## 2019-11-10 DIAGNOSIS — M3219 Other organ or system involvement in systemic lupus erythematosus: Secondary | ICD-10-CM

## 2019-11-10 NOTE — Telephone Encounter (Signed)
Last Visit: 07/24/2019 Next Visit: 10/23/2019 Labs: 07/24/2019 Mild neutropenia noted. All other labs are stable. Continue current treatment. Eye exam: 05/08/2019   Current Dose per office note on 07/24/2019: PLQ 200 mg p.o. twice daily Monday through Friday  Dx: Other systemic lupus erythematosus with other organ involvement   Okay to refill PLQ?

## 2019-11-14 NOTE — Progress Notes (Signed)
Office Visit Note  Patient: [name]             Date of Birth: 10-Dec-1950           MRN: [id number]             PCP: [name], MD Referring: [name], MD Visit Date: 11/27/2019 Occupation: @GUAROCC @  Subjective:  Medication Management   History of Present Illness: [name] is a 69 y.o. female with history of systemic lupus and lupus nephritis.  Her lupus nephritis has been in remission and she has been followed by the nephrologist.  She denies any history of oral ulcers, nasal ulcers, malar rash, photosensitivity, Raynaud's phenomenon, sicca symptoms or joint swelling.  She has been tolerating Plaquenil well.  She has been getting her eye examination some the regular basis.  Activities of Daily Living:  Patient reports morning stiffness for 0 minutes.   Patient Denies nocturnal pain.  Difficulty dressing/grooming: Denies Difficulty climbing stairs: Denies Difficulty getting out of chair: Denies Difficulty using hands for taps, buttons, cutlery, and/or writing: Denies  Review of Systems  Constitutional: Negative for fatigue.  HENT: Positive for mouth dryness. Negative for mouth sores and nose dryness.   Eyes: Positive for dryness.  Respiratory: Negative for shortness of breath and difficulty breathing.   Cardiovascular: Negative for chest pain and palpitations.  Gastrointestinal: Negative for blood in stool, constipation and diarrhea.  Endocrine: Negative for increased urination.  Genitourinary: Negative for painful urination.  Musculoskeletal: Negative for arthralgias, joint pain, joint swelling, myalgias, morning stiffness, muscle tenderness and myalgias.  Skin: Negative for color change, rash and redness.  Allergic/Immunologic: Negative for susceptible to infections.  Neurological: Negative for dizziness, numbness, headaches, memory loss and weakness.  Hematological: Negative for bruising/bleeding tendency.  Psychiatric/Behavioral:  Negative for confusion.    PMFS History:  Patient Active Problem List   Diagnosis Date Noted  . Allergic sinusitis 11/04/2018  . Lupus nephritis ([place]) 1999-2000  09/15/2016  . High risk medication use 09/15/2016  . DDD (degenerative disc disease), lumbar 09/15/2016  . History of hyperlipidemia 09/15/2016  . History of anemia 09/15/2016  . History of proteinuria  09/15/2016  . Dermatitis of face 09/15/2016  . Tubular adenoma of colon 04/30/2016  . Prediabetes 11/15/2014  . Annual physical exam 01/06/2014  . SLE (systemic lupus erythematosus) ([place]) 02/15/2012  . History of diverticulosis 10/01/2007    Past Medical History:  Diagnosis Date  . Arthritis 02/13/10   abnormal MRI lumbar and c spine  with disc disease  . Osteoporosis   . SLE (systemic lupus erythematosus) ([place]) dx apprx 1990   reports immunotherapy, cytoxan and steroids at [facility], no f/u for over 27yrs    Family History  Problem Relation Age of Onset  . Heart disease Father   . Cancer Brother 42       breast  . Cancer Sister 22       breast  . Colon cancer Neg Hx    Past Surgical History:  Procedure Laterality Date  . ABDOMINAL HYSTERECTOMY  unsure, over 10 yrs   partial, has had abnormal pap  . BLADDER REPAIR    . COLONOSCOPY N/A 01/05/2014   Procedure: COLONOSCOPY;  Surgeon: Daneil Dolin, MD;  Location: [facility];  Service: Endoscopy;  Laterality: N/A;  12:15 PM   Social History   Social History Narrative  . Not on file   Immunization History  Administered Date(s) Administered  . Fluad Quad(high Dose  65+) 11/18/2018  . Influenza Split 02/14/2012  . Influenza Whole 12/28/2000  . Influenza,inj,Quad PF,6+ Mos 12/16/2012, 01/06/2014, 01/19/2016, 12/10/2017  . Moderna SARS-COVID-2 Vaccination 05/02/2019, 05/31/2019  . Pneumococcal Conjugate-13 09/25/2017  . Pneumococcal Polysaccharide-23 09/30/2018  . Td 09/17/2003  . Tdap 01/06/2014  . Zoster 12/17/2012     Objective: Vital Signs: BP 133/85  (BP Location: Left Arm, Patient Position: Sitting, Cuff Size: Normal)   Pulse 65   Resp 14   Ht 5\' 2"  (1.575 m)   Wt 143 lb 6.4 oz (65 kg)   BMI 26.23 kg/m    Physical Exam Vitals and nursing note reviewed.  Constitutional:      Appearance: She is well-developed.  HENT:     Head: Normocephalic and atraumatic.  Eyes:     Conjunctiva/sclera: Conjunctivae normal.  Cardiovascular:     Rate and Rhythm: Normal rate and regular rhythm.     Heart sounds: Normal heart sounds.  Pulmonary:     Effort: Pulmonary effort is normal.     Breath sounds: Normal breath sounds.  Abdominal:     General: Bowel sounds are normal.     Palpations: Abdomen is soft.  Musculoskeletal:     Cervical back: Normal range of motion.  Lymphadenopathy:     Cervical: No cervical adenopathy.  Skin:    General: Skin is warm and dry.     Capillary Refill: Capillary refill takes less than 2 seconds.  Neurological:     Mental Status: She is alert and oriented to person, place, and time.  Psychiatric:        Behavior: Behavior normal.      Musculoskeletal Exam: C-spine thoracic and lumbar spine with good range of motion.  Shoulder joints, elbow joints, wrist joints, MCPs PIPs and DIPs with good range of motion with no synovitis.  Hip joints, knee joints, ankles, MTPs and PIPs with good range of motion with no synovitis.  She does have bilateral hallux valgus deformity.  CDAI Exam: CDAI Score: -- Patient Global: --; Provider Global: -- Swollen: --; Tender: -- Joint Exam 11/27/2019   No joint exam has been documented for this visit   There is currently no information documented on the homunculus. Go to the Rheumatology activity and complete the homunculus joint exam.  Investigation: No additional findings.  Imaging: No results found.  Recent Labs: Lab Results  Component Value Date   WBC 3.4 (L) 07/24/2019   HGB 12.6 07/24/2019   PLT 160 07/24/2019   NA 141 07/24/2019   K 4.6 07/24/2019   CL 105  07/24/2019   CO2 29 07/24/2019   GLUCOSE 71 07/24/2019   BUN 21 07/24/2019   CREATININE 1.23 (H) 07/24/2019   BILITOT 0.4 07/24/2019   ALKPHOS 43 09/18/2016   AST 25 07/24/2019   ALT 20 07/24/2019   PROT 7.2 07/24/2019   ALBUMIN 3.8 09/18/2016   CALCIUM 9.6 07/24/2019   GFRAA 53 (L) 07/24/2019    Speciality Comments: Plaquenil eye exam normal on 05/08/2019 per my eye doctor in [place]  Procedures:  No procedures performed Allergies: Sulfa antibiotics and Sulfonamide derivatives   Assessment / Plan:     Visit Diagnoses: Other systemic lupus erythematosus with other organ involvement ([place]) - history of dsDNA +64, Smith + 5.5, RNP+ 6.5, SSA([place]) Negative, SSB ([place]) Negative, presented initially with nephritis, hair loss, arthralgias, dermatitis.  She is clinically doing very well on Plaquenil.  There is no history of oral ulcers, nasal ulcers, malar rash, photosensitivity, Raynaud's or joint  inflammation today.  Use of sunscreen was emphasized.  Lupus nephritis ([place]) 1999-2000 - treated with Cytoxan IV and prednisone for 1 year.  She follows up with her nephrologist every year (Dr. Theador Hawthorne)  High risk medication use - PLQ 200 mg p.o. twice daily Monday through Friday. Plaquenil eye exam normal on 05/08/2019.  Her labs are stable.  I will get labs again today to monitor for disease process.  Dermatitis-she had no active lesions today.  History of renal insufficiency - followed by nephrology.  DDD (degenerative disc disease), lumbar-she continues to have some lower back pain.  Muscle strengthening was discussed.  Age-related osteoporosis without current pathological fracture - Managed by Dr. Moshe Cipro.  She is on Fosamax 70 mg weekly started in October 2019.  Most recent DEXA on 01/09/2018: showed T-score of -2.8 at [facility].  She will get repeat DEXA by her PCP.  History of adenomatous polyp of colon  History of hyperlipidemia  History of proteinuria syndrome  History of  diverticulosis  History of anemia  History of prediabetes  She has had both COVID-19 vaccines.  Use of mask, social distancing and hand hygiene was emphasized.  I have advised her to get COVID-19 booster.  I also advised her to contact us in case she develops COVID-19 infection to receive monoclonal antibody infusion.  Instructions were placed in the AVS.  Orders: Orders Placed This Encounter  Procedures  . CBC with Differential/Platelet  . COMPLETE METABOLIC PANEL WITH GFR  . Urinalysis, Routine w reflex microscopic  . ANA  . Anti-DNA antibody, double-stranded  . C3 and C4  . Sedimentation rate   No orders of the defined types were placed in this encounter.    Follow-Up Instructions: Return in about 5 months (around 04/28/2020) for Systemic lupus.   Bo Merino, MD  Note - This record has been created using Editor, commissioning.  Chart creation errors have been sought, but may not always  have been located. Such creation errors do not reflect on  the standard of medical care.

## 2019-11-26 ENCOUNTER — Telehealth: Payer: Self-pay

## 2019-11-26 NOTE — Telephone Encounter (Signed)
Recommended for everyone 8 months after the 2nd shot if the got  moderna or [employer]

## 2019-11-26 NOTE — Telephone Encounter (Signed)
Patient aware.

## 2019-11-26 NOTE — Telephone Encounter (Signed)
Patients wants your opinion on her getting the 3rd Covid booster.

## 2019-11-27 ENCOUNTER — Ambulatory Visit: Payer: Medicare PPO | Admitting: Rheumatology

## 2019-11-27 ENCOUNTER — Other Ambulatory Visit: Payer: Self-pay

## 2019-11-27 ENCOUNTER — Encounter: Payer: Self-pay | Admitting: Rheumatology

## 2019-11-27 VITALS — BP 133/85 | HR 65 | Resp 14 | Ht 62.0 in | Wt 143.4 lb

## 2019-11-27 DIAGNOSIS — M3214 Glomerular disease in systemic lupus erythematosus: Secondary | ICD-10-CM

## 2019-11-27 DIAGNOSIS — Z86010 Personal history of colonic polyps: Secondary | ICD-10-CM | POA: Diagnosis not present

## 2019-11-27 DIAGNOSIS — Z79899 Other long term (current) drug therapy: Secondary | ICD-10-CM | POA: Diagnosis not present

## 2019-11-27 DIAGNOSIS — M810 Age-related osteoporosis without current pathological fracture: Secondary | ICD-10-CM

## 2019-11-27 DIAGNOSIS — Z8639 Personal history of other endocrine, nutritional and metabolic disease: Secondary | ICD-10-CM | POA: Diagnosis not present

## 2019-11-27 DIAGNOSIS — M3219 Other organ or system involvement in systemic lupus erythematosus: Secondary | ICD-10-CM | POA: Diagnosis not present

## 2019-11-27 DIAGNOSIS — Z87898 Personal history of other specified conditions: Secondary | ICD-10-CM

## 2019-11-27 DIAGNOSIS — Z87448 Personal history of other diseases of urinary system: Secondary | ICD-10-CM

## 2019-11-27 DIAGNOSIS — L309 Dermatitis, unspecified: Secondary | ICD-10-CM

## 2019-11-27 DIAGNOSIS — Z8719 Personal history of other diseases of the digestive system: Secondary | ICD-10-CM

## 2019-11-27 DIAGNOSIS — M5136 Other intervertebral disc degeneration, lumbar region: Secondary | ICD-10-CM | POA: Diagnosis not present

## 2019-11-27 DIAGNOSIS — Z862 Personal history of diseases of the blood and blood-forming organs and certain disorders involving the immune mechanism: Secondary | ICD-10-CM

## 2019-11-27 NOTE — Patient Instructions (Signed)

## 2019-11-28 DIAGNOSIS — Z79899 Other long term (current) drug therapy: Secondary | ICD-10-CM | POA: Diagnosis not present

## 2019-11-28 DIAGNOSIS — M3219 Other organ or system involvement in systemic lupus erythematosus: Secondary | ICD-10-CM | POA: Diagnosis not present

## 2019-12-02 LAB — URINALYSIS, ROUTINE W REFLEX MICROSCOPIC
Bilirubin Urine: NEGATIVE
Glucose, UA: NEGATIVE
Hgb urine dipstick: NEGATIVE
Ketones, ur: NEGATIVE
Leukocytes,Ua: NEGATIVE
Nitrite: NEGATIVE
Protein, ur: NEGATIVE
Specific Gravity, Urine: 1.004 (ref 1.001–1.03)
pH: 6 (ref 5.0–8.0)

## 2019-12-02 LAB — CBC WITH DIFFERENTIAL/PLATELET
Absolute Monocytes: 602 cells/uL (ref 200–950)
Basophils Absolute: 49 cells/uL (ref 0–200)
Basophils Relative: 1.4 %
Eosinophils Absolute: 70 cells/uL (ref 15–500)
Eosinophils Relative: 2 %
HCT: 37.9 % (ref 35.0–45.0)
Hemoglobin: 12.2 g/dL (ref 11.7–15.5)
Lymphs Abs: 1253 cells/uL (ref 850–3900)
MCH: 27.7 pg (ref 27.0–33.0)
MCHC: 32.2 g/dL (ref 32.0–36.0)
MCV: 86.1 fL (ref 80.0–100.0)
MPV: 13.2 fL — ABNORMAL HIGH (ref 7.5–12.5)
Monocytes Relative: 17.2 %
Neutro Abs: 1526 cells/uL (ref 1500–7800)
Neutrophils Relative %: 43.6 %
Platelets: 160 10*3/uL (ref 140–400)
RBC: 4.4 10*6/uL (ref 3.80–5.10)
RDW: 13.5 % (ref 11.0–15.0)
Total Lymphocyte: 35.8 %
WBC: 3.5 10*3/uL — ABNORMAL LOW (ref 3.8–10.8)

## 2019-12-02 LAB — COMPLETE METABOLIC PANEL WITH GFR
AG Ratio: 1.3 (calc) (ref 1.0–2.5)
ALT: 20 U/L (ref 6–29)
AST: 29 U/L (ref 10–35)
Albumin: 4.1 g/dL (ref 3.6–5.1)
Alkaline phosphatase (APISO): 31 U/L — ABNORMAL LOW (ref 37–153)
BUN/Creatinine Ratio: 12 (calc) (ref 6–22)
BUN: 16 mg/dL (ref 7–25)
CO2: 28 mmol/L (ref 20–32)
Calcium: 9.8 mg/dL (ref 8.6–10.4)
Chloride: 103 mmol/L (ref 98–110)
Creat: 1.35 mg/dL — ABNORMAL HIGH (ref 0.50–0.99)
GFR, Est African American: 47 mL/min/{1.73_m2} — ABNORMAL LOW (ref >=60)
GFR, Est Non African American: 41 mL/min/{1.73_m2} — ABNORMAL LOW (ref >=60)
Globulin: 3.1 g/dL (calc) (ref 1.9–3.7)
Glucose, Bld: 82 mg/dL (ref 65–139)
Potassium: 4.5 mmol/L (ref 3.5–5.3)
Sodium: 138 mmol/L (ref 135–146)
Total Bilirubin: 0.5 mg/dL (ref 0.2–1.2)
Total Protein: 7.2 g/dL (ref 6.1–8.1)

## 2019-12-02 LAB — ANTI-NUCLEAR AB-TITER (ANA TITER)
ANA TITER: 1:40 {titer} — ABNORMAL HIGH
ANA Titer 1: 1:80 {titer} — ABNORMAL HIGH

## 2019-12-02 LAB — C3 AND C4
C3 Complement: 113 mg/dL (ref 83–193)
C4 Complement: 21 mg/dL (ref 15–57)

## 2019-12-02 LAB — ANA: Anti Nuclear Antibody (ANA): POSITIVE — AB

## 2019-12-02 LAB — SEDIMENTATION RATE: Sed Rate: 9 mm/h (ref 0–30)

## 2019-12-02 LAB — ANTI-DNA ANTIBODY, DOUBLE-STRANDED: ds DNA Ab: 7 IU/mL — ABNORMAL HIGH

## 2019-12-03 NOTE — Progress Notes (Signed)
WBC is low and stable.All other labs are stable.  Do not indicate a disease flare.

## 2019-12-08 ENCOUNTER — Other Ambulatory Visit: Payer: Self-pay | Admitting: Family Medicine

## 2019-12-16 ENCOUNTER — Telehealth: Payer: Medicare PPO

## 2019-12-22 DIAGNOSIS — H353131 Nonexudative age-related macular degeneration, bilateral, early dry stage: Secondary | ICD-10-CM | POA: Diagnosis not present

## 2019-12-22 DIAGNOSIS — H35363 Drusen (degenerative) of macula, bilateral: Secondary | ICD-10-CM | POA: Diagnosis not present

## 2019-12-22 DIAGNOSIS — H35033 Hypertensive retinopathy, bilateral: Secondary | ICD-10-CM | POA: Diagnosis not present

## 2019-12-22 DIAGNOSIS — Z79899 Other long term (current) drug therapy: Secondary | ICD-10-CM | POA: Diagnosis not present

## 2019-12-22 DIAGNOSIS — H35453 Secondary pigmentary degeneration, bilateral: Secondary | ICD-10-CM | POA: Diagnosis not present

## 2019-12-22 DIAGNOSIS — H35011 Changes in retinal vascular appearance, right eye: Secondary | ICD-10-CM | POA: Diagnosis not present

## 2019-12-22 DIAGNOSIS — H25813 Combined forms of age-related cataract, bilateral: Secondary | ICD-10-CM | POA: Diagnosis not present

## 2020-01-05 ENCOUNTER — Ambulatory Visit (INDEPENDENT_AMBULATORY_CARE_PROVIDER_SITE_OTHER): Payer: Medicare PPO

## 2020-01-05 ENCOUNTER — Other Ambulatory Visit: Payer: Self-pay

## 2020-01-05 ENCOUNTER — Telehealth (INDEPENDENT_AMBULATORY_CARE_PROVIDER_SITE_OTHER): Payer: Medicare PPO

## 2020-01-05 VITALS — BP 133/85 | Ht 62.0 in | Wt 142.0 lb

## 2020-01-05 DIAGNOSIS — Z0000 Encounter for general adult medical examination without abnormal findings: Secondary | ICD-10-CM

## 2020-01-05 DIAGNOSIS — Z23 Encounter for immunization: Secondary | ICD-10-CM

## 2020-01-05 NOTE — Progress Notes (Signed)
Subjective:   [name] is a 69 y.o. female who presents for Medicare Annual (Subsequent) preventive examination.  Review of Systems     Cardiac Risk Factors include: family history of premature cardiovascular disease;advanced age (>70men, >58 women)     Objective:    Today's Vitals   01/05/20 1409 01/05/20 1411  BP: 133/85   Weight: 142 lb (64.4 kg)   Height: 5\' 2"  (1.575 m)   PainSc: 0-No pain 0-No pain   Body mass index is 25.97 kg/m.  Advanced Directives 01/05/2020 01/05/2014  Does Patient Have a Medical Advance Directive? No No  Would patient like information on creating a medical advance directive? No - Patient declined Yes - Educational materials given    Current Medications (verified) Outpatient Encounter Medications as of 01/05/2020  Medication Sig  . alendronate (FOSAMAX) 70 MG tablet TAKE ONE TABLET (70MG  TOTAL) BY MOUTH EVERY 7 DAYS. TAKE WITH A FULL GLASS OF WATER ON AN EMPTY STOMACH.  . calcium gluconate 500 MG tablet Take 1 tablet by mouth daily.  . cyanocobalamin 1000 MCG tablet Take 1,000 mcg by mouth daily.  . hydroxychloroquine (PLAQUENIL) 200 MG tablet TAKE ONE TABLET BY MOUTH TWICE A DAY MONDAY THROUGH FRIDAY.  . Multiple Vitamin (MULTIVITAMIN) capsule Take 1 capsule by mouth daily.  . Multiple Vitamins-Minerals (SYSTANE ICAPS AREDS2) CAPS Take by mouth.  . Vitamin A 2400 MCG (8000 UT) TABS Take by mouth.  Marland Kitchen VITAMIN D PO Take by mouth.   No facility-administered encounter medications on file as of 01/05/2020.    Allergies (verified) Sulfa antibiotics and Sulfonamide derivatives   History: Past Medical History:  Diagnosis Date  . Arthritis 02/13/10   abnormal MRI lumbar and c spine  with disc disease  . Osteoporosis   . SLE (systemic lupus erythematosus) ([place]) dx apprx 1990   reports immunotherapy, cytoxan and steroids at [facility], no f/u for over 61yrs   Past Surgical History:  Procedure Laterality Date  . ABDOMINAL HYSTERECTOMY   unsure, over 10 yrs   partial, has had abnormal pap  . BLADDER REPAIR    . COLONOSCOPY N/A 01/05/2014   Procedure: COLONOSCOPY;  Surgeon: Daneil Dolin, MD;  Location[facility];  Service: Endoscopy;  Laterality: N/A;  12:15 PM   Family History  Problem Relation Age of Onset  . Heart disease Father   . Cancer Brother 72       breast  . Cancer Sister 46       breast  . Colon cancer Neg Hx    Social History   Socioeconomic History  . Marital status: Married    Spouse name: Not on file  . Number of children: Not on file  . Years of education: Not on file  . Highest education level: Not on file  Occupational History  . Not on file  Tobacco Use  . Smoking status: Never Smoker  . Smokeless tobacco: Never Used  Vaping Use  . Vaping Use: Never used  Substance and Sexual Activity  . Alcohol use: No  . Drug use: No  . Sexual activity: Not Currently  Other Topics Concern  . Not on file  Social History Narrative  . Not on file   Social Determinants of Health   Financial Resource Strain: Low Risk   . Difficulty of Paying Living Expenses: Not hard at all  Food Insecurity: No Food Insecurity  . Worried About Charity fundraiser in the Last Year: Never true  .  Ran Out of Food in the Last Year: Never true  Transportation Needs: No Transportation Needs  . Lack of Transportation (Medical): No  . Lack of Transportation (Non-Medical): No  Physical Activity: Insufficiently Active  . Days of Exercise per Week: 3 days  . Minutes of Exercise per Session: 20 min  Stress: No Stress Concern Present  . Feeling of Stress : Only a little  Social Connections: Socially Integrated  . Frequency of Communication with Friends and Family: More than three times a week  . Frequency of Social Gatherings with Friends and Family: More than three times a week  . Attends Religious Services: More than 4 times per year  . Active Member of Clubs or Organizations: Yes  . Attends Archivist  Meetings: 1 to 4 times per year  . Marital Status: Married    Tobacco Counseling Counseling given: Not Answered   Clinical Intake:  Pre-visit preparation completed: Yes  Pain : No/denies pain Pain Score: 0-No pain     BMI - recorded: 26.22 Nutritional Status: BMI 25 -29 Overweight Nutritional Risks: None Diabetes: No  How often do you need to have someone help you when you read instructions, pamphlets, or other written materials from your doctor or pharmacy?: 1 - Never What is the last grade level you completed in school?: 1 year in college  Diabetic?no  Interpreter Needed?: No      Activities of Daily Living In your present state of health, do you have any difficulty performing the following activities: 01/05/2020  Hearing? N  Vision? N  Difficulty concentrating or making decisions? N  Walking or climbing stairs? N  Dressing or bathing? N  Doing errands, shopping? N  Preparing Food and eating ? N  Using the Toilet? N  In the past six months, have you accidently leaked urine? N  Do you have problems with loss of bowel control? N  Managing your Medications? N  Managing your Finances? N  Housekeeping or managing your Housekeeping? N  Some recent data might be hidden    Patient Care Team: [name], MD as PCP - General Rourk, Cristopher Estimable, MD as Consulting Physician (Gastroenterology)  Indicate any recent Medical Services you may have received from other than [facility] providers in the past year (date may be approximate).     Assessment:   This is a routine wellness examination for [name].  Hearing/Vision screen No exam data present  Dietary issues and exercise activities discussed: Current Exercise Habits: Home exercise routine, Type of exercise: walking, Time (Minutes): 20, Frequency (Times/Week): 3, Weekly Exercise (Minutes/Week): 60, Intensity: Mild, Exercise limited by: None identified  Goals    . Increase physical activity     Start exercising  more, several times a week.      Depression Screen PHQ 2/9 Scores 01/05/2020 01/05/2020 10/01/2019 06/17/2019 05/14/2019 12/13/2018 11/04/2018  PHQ - 2 Score 0 0 0 0 0 0 0  PHQ- 9 Score - - - - - - -    Fall Risk Fall Risk  01/05/2020 10/01/2019 06/17/2019 05/14/2019 12/13/2018  Falls in the past year? 1 1 1  0 0  Comment - - tripped over stump yday 06/16/19 - -  Number falls in past yr: 0 1 0 0 0  Injury with Fall? 0 0 0 0 0  Comment - - scrapped left knee - -  Risk for fall due to : Impaired balance/gait - - - -  Risk for fall due to: Comment - - - - -  Follow up Falls evaluation completed - Falls evaluation completed - -    Any stairs in or around the home? Yes  If so, are there any without handrails? No  Home free of loose throw rugs in walkways, pet beds, electrical cords, etc? Yes  Adequate lighting in your home to reduce risk of falls? Yes   ASSISTIVE DEVICES UTILIZED TO PREVENT FALLS:  Life alert? No  Use of a cane, walker or w/c? No  Grab bars in the bathroom? Yes  Shower chair or bench in shower? No  Elevated toilet seat or a handicapped toilet? No   TIMED UP AND GO:  Was the test performed? No .  Length of time to ambulate n/a   Cognitive Function:     6CIT Screen 01/05/2020 12/13/2018  What Year? 0 points 0 points  What month? 0 points 0 points  What time? 0 points 0 points  Count back from 20 0 points 0 points  Months in reverse 0 points 0 points  Repeat phrase 0 points 0 points  Total Score 0 0    Immunizations Immunization History  Administered Date(s) Administered  . Fluad Quad(high Dose 65+) 11/18/2018  . Influenza Split 02/14/2012  . Influenza Whole 12/28/2000  . Influenza,inj,Quad PF,6+ Mos 12/16/2012, 01/06/2014, 01/19/2016, 12/10/2017  . Moderna SARS-COVID-2 Vaccination 05/02/2019, 05/31/2019  . Pneumococcal Conjugate-13 09/25/2017  . Pneumococcal Polysaccharide-23 09/30/2018  . Td 09/17/2003  . Tdap 01/06/2014  . Zoster 12/17/2012    TDAP  status: Up to date Flu Vaccine status: Completed at today's visit Pneumococcal vaccine status: Up to date Covid-19 vaccine status: Completed vaccines  Qualifies for Shingles Vaccine? Yes   Zostavax completed Yes   Shingrix Completed?: No.    Education has been provided regarding the importance of this vaccine. Patient has been advised to call insurance company to determine out of pocket expense if they have not yet received this vaccine. Advised may also receive vaccine at local pharmacy or Health Dept. Verbalized acceptance and understanding.  Screening Tests Health Maintenance  Topic Date Due  . INFLUENZA VACCINE  10/26/2019  . Hepatitis C Screening  06/16/2020 (Originally [birth year])  . MAMMOGRAM  01/16/2021  . COLONOSCOPY  01/06/2024  . TETANUS/TDAP  01/07/2024  . DEXA SCAN  Completed  . COVID-19 Vaccine  Completed  . PNA vac Low Risk Adult  Completed    Health Maintenance  Health Maintenance Due  Topic Date Due  . INFLUENZA VACCINE  10/26/2019     Bone Density status: Completed 01/19/20. Results reflect: Bone density results: NORMAL. Repeat every 5 years.  Lung Cancer Screening: (Low Dose CT Chest recommended if Age 23-80 years, 30 pack-year currently smoking OR have quit w/in 15years.) does not qualify.   Lung Cancer Screening Referral: n/a  Additional Screening:  Hepatitis C Screening: does not qualify; Completed n/a  Vision Screening: Recommended annual ophthalmology exams for early detection of glaucoma and other disorders of the eye. Is the patient up to date with their annual eye exam?  No  Who is the provider or what is the name of the office in which the patient attends annual eye exams? Dr. Jorja Loa @ [facility]. If pt is not established with a provider, would they like to be referred to a provider to establish care? n/a.   Dental Screening: Recommended annual dental exams for proper oral hygiene  Community Resource Referral / Chronic Care Management: CRR  required this visit?  No   CCM required this visit?  No  Plan:     I have personally reviewed and noted the following in the patient's chart:   . Medical and social history . Use of alcohol, tobacco or illicit drugs  . Current medications and supplements . Functional ability and status . Nutritional status . Physical activity . Advanced directives . List of other physicians . Hospitalizations, surgeries, and ER visits in previous 12 months . Vitals . Screenings to include cognitive, depression, and falls . Referrals and appointments  In addition, I have reviewed and discussed with patient certain preventive protocols, quality metrics, and best practice recommendations. A written personalized care plan for preventive services as well as general preventive health recommendations were provided to patient.     Laretta Bolster, [place]   25/67/2091   Nurse Notes:

## 2020-01-07 DIAGNOSIS — M3214 Glomerular disease in systemic lupus erythematosus: Secondary | ICD-10-CM | POA: Diagnosis not present

## 2020-01-07 DIAGNOSIS — N1831 Chronic kidney disease, stage 3a: Secondary | ICD-10-CM | POA: Diagnosis not present

## 2020-01-07 DIAGNOSIS — R030 Elevated blood-pressure reading, without diagnosis of hypertension: Secondary | ICD-10-CM | POA: Diagnosis not present

## 2020-01-07 DIAGNOSIS — Z79899 Other long term (current) drug therapy: Secondary | ICD-10-CM | POA: Diagnosis not present

## 2020-01-19 ENCOUNTER — Ambulatory Visit (HOSPITAL_COMMUNITY)
Admission: RE | Admit: 2020-01-19 | Discharge: 2020-01-19 | Disposition: A | Discharge status: Home / Self Care | Payer: Medicare PPO | Source: Ambulatory Visit | Attending: Family Medicine | Admitting: Family Medicine

## 2020-01-19 ENCOUNTER — Other Ambulatory Visit: Payer: Self-pay

## 2020-01-19 DIAGNOSIS — Z780 Asymptomatic menopausal state: Secondary | ICD-10-CM | POA: Diagnosis not present

## 2020-01-19 DIAGNOSIS — M8588 Other specified disorders of bone density and structure, other site: Secondary | ICD-10-CM | POA: Diagnosis not present

## 2020-01-19 DIAGNOSIS — Z90710 Acquired absence of both cervix and uterus: Secondary | ICD-10-CM | POA: Diagnosis not present

## 2020-02-09 ENCOUNTER — Other Ambulatory Visit: Payer: Self-pay | Admitting: Physician Assistant

## 2020-02-09 DIAGNOSIS — M3219 Other organ or system involvement in systemic lupus erythematosus: Secondary | ICD-10-CM

## 2020-02-10 NOTE — Telephone Encounter (Signed)
Last OV 11/27/2019 Last Labs 11/27/2019 Last RF 11/10/2019 Next OV 04/2020  WBC is low and stable.All other labs are stable.  Do not indicate a disease flare.

## 2020-02-10 NOTE — Telephone Encounter (Signed)
PLQ eye exam is up to date.  Ok to send in 90-day supply of PLQ.

## 2020-02-12 ENCOUNTER — Other Ambulatory Visit: Payer: Self-pay

## 2020-02-12 ENCOUNTER — Ambulatory Visit (HOSPITAL_COMMUNITY)
Admission: RE | Admit: 2020-02-12 | Discharge: 2020-02-12 | Disposition: A | Discharge status: Home / Self Care | Payer: Medicare PPO | Source: Ambulatory Visit | Attending: Internal Medicine | Admitting: Internal Medicine

## 2020-02-12 ENCOUNTER — Encounter: Payer: Self-pay | Admitting: Internal Medicine

## 2020-02-12 ENCOUNTER — Ambulatory Visit: Payer: Medicare PPO | Admitting: Internal Medicine

## 2020-02-12 VITALS — BP 170/70 | HR 73 | Temp 98.1°F | Resp 18 | Ht 62.0 in | Wt 144.1 lb

## 2020-02-12 DIAGNOSIS — M25572 Pain in left ankle and joints of left foot: Secondary | ICD-10-CM | POA: Diagnosis not present

## 2020-02-12 DIAGNOSIS — M329 Systemic lupus erythematosus, unspecified: Secondary | ICD-10-CM

## 2020-02-12 DIAGNOSIS — M3219 Other organ or system involvement in systemic lupus erythematosus: Secondary | ICD-10-CM | POA: Diagnosis not present

## 2020-02-12 DIAGNOSIS — M7989 Other specified soft tissue disorders: Secondary | ICD-10-CM | POA: Diagnosis not present

## 2020-02-12 MED ORDER — PREDNISONE 10 MG PO TABS: 10.0000 mg | ORAL_TABLET | Freq: Every day | ORAL | 0 refills | Status: DC

## 2020-02-12 NOTE — Patient Instructions (Signed)
Please take Prednisone as prescribed.  Okay to apply heating pad and/or local pain relieving cream, like Bengay/IcyHot.  Okay to take Tylenol for pain/swelling. Please do not take Motrin or Aleve.  You are being scheduled to get X-ray of the left ankle.  Please call your Rheumatologist to discuss if they want to evaluate you sooner.  Please contact us if symptoms are persistent.

## 2020-02-12 NOTE — Assessment & Plan Note (Signed)
Left ankle and knee pain Check left ankle X-ray Prednisone Tylenol PRN Advised patient to contact Rheumatology office as well.

## 2020-02-12 NOTE — Progress Notes (Signed)
X ray

## 2020-02-12 NOTE — Assessment & Plan Note (Signed)
No h/o injury or insect bite H/o SLE Will treat as SLE flare - Prednisone Check X-ray left ankle

## 2020-02-12 NOTE — Progress Notes (Signed)
Established Patient Office Visit  Subjective:  Patient ID: [name], female    DOB: 1950/06/26  Age: 69 y.o. MRN: [id number]  CC:  Chief Complaint  Patient presents with  . Foot Swelling    leg and feet swelling on the left side yesterday has gone down today but there is pain in her calf also when this happened she had a pulsating pain in her back near her kidneys     HPI [name] is a 69 year old female with PMH of SLE with GN, prediabetes and osteoporosis presents for evaluation of left ankle and calf pain for about 5 days. Pain is described as dull, non-radiating, 5-7/10, and without any numbness, tingling or weakness of the feet. She also has noticed swelling of the left knee and some left calf soreness, which is better now. She denies any injury or insect bite. She denies any new rash, fever, chills or fatigue.  Past Medical History:  Diagnosis Date  . Arthritis 02/13/10   abnormal MRI lumbar and c spine  with disc disease  . Osteoporosis   . SLE (systemic lupus erythematosus) ([place]) dx apprx 1990   reports immunotherapy, cytoxan and steroids at [facility], no f/u for over 81yrs    Past Surgical History:  Procedure Laterality Date  . ABDOMINAL HYSTERECTOMY  unsure, over 10 yrs   partial, has had abnormal pap  . BLADDER REPAIR    . COLONOSCOPY N/A 01/05/2014   Procedure: COLONOSCOPY;  Surgeon: Daneil Dolin, MD;  [facility];  Service: Endoscopy;  Laterality: N/A;  12:15 PM    Family History  Problem Relation Age of Onset  . Heart disease Father   . Cancer Brother 61       breast  . Cancer Sister 58       breast  . Colon cancer Neg Hx     Social History   Socioeconomic History  . Marital status: Married    Spouse name: Not on file  . Number of children: Not on file  . Years of education: Not on file  . Highest education level: Not on file  Occupational History  . Not on file  Tobacco Use  . Smoking status: Never Smoker  . Smokeless  tobacco: Never Used  Vaping Use  . Vaping Use: Never used  Substance and Sexual Activity  . Alcohol use: No  . Drug use: No  . Sexual activity: Not Currently  Other Topics Concern  . Not on file  Social History Narrative  . Not on file   Social Determinants of Health   Financial Resource Strain: Low Risk   . Difficulty of Paying Living Expenses: Not hard at all  Food Insecurity: No Food Insecurity  . Worried About Charity fundraiser in the Last Year: Never true  . Ran Out of Food in the Last Year: Never true  Transportation Needs: No Transportation Needs  . Lack of Transportation (Medical): No  . Lack of Transportation (Non-Medical): No  Physical Activity: Insufficiently Active  . Days of Exercise per Week: 3 days  . Minutes of Exercise per Session: 20 min  Stress: No Stress Concern Present  . Feeling of Stress : Only a little  Social Connections: Socially Integrated  . Frequency of Communication with Friends and Family: More than three times a week  . Frequency of Social Gatherings with Friends and Family: More than three times a week  . Attends Religious Services: More than 4 times per  year  . Active Member of Clubs or Organizations: Yes  . Attends Archivist Meetings: 1 to 4 times per year  . Marital Status: Married  Human resources officer Violence: Not At Risk  . Fear of Current or Ex-Partner: No  . Emotionally Abused: No  . Physically Abused: No  . Sexually Abused: No    Outpatient Medications Prior to Visit  Medication Sig Dispense Refill  . alendronate (FOSAMAX) 70 MG tablet TAKE ONE TABLET (70MG  TOTAL) BY MOUTH EVERY 7 DAYS. TAKE WITH A FULL GLASS OF WATER ON AN EMPTY STOMACH. 12 tablet 0  . calcium gluconate 500 MG tablet Take 1 tablet by mouth daily.    . cyanocobalamin 1000 MCG tablet Take 1,000 mcg by mouth daily.    . hydroxychloroquine (PLAQUENIL) 200 MG tablet TAKE ONE TABLET BY MOUTH TWICE A DAY MONDAY THROUGH FRIDAY. 120 tablet 0  . Multiple  Vitamin (MULTIVITAMIN) capsule Take 1 capsule by mouth daily.    . Multiple Vitamins-Minerals (SYSTANE ICAPS AREDS2) CAPS Take by mouth.    . Vitamin A 2400 MCG (8000 UT) TABS Take by mouth.    Marland Kitchen VITAMIN D PO Take by mouth.     No facility-administered medications prior to visit.    Allergies  Allergen Reactions  . Sulfa Antibiotics   . Sulfonamide Derivatives     ROS Review of Systems  Constitutional: Negative for chills and fever.  HENT: Negative for congestion, sinus pressure, sinus pain and sore throat.   Eyes: Negative for pain and discharge.  Respiratory: Negative for cough and shortness of breath.   Cardiovascular: Negative for chest pain and palpitations.  Gastrointestinal: Negative for abdominal pain, constipation, diarrhea, nausea and vomiting.  Endocrine: Negative for polydipsia and polyuria.  Genitourinary: Negative for dysuria and hematuria.  Musculoskeletal: Positive for arthralgias and joint swelling (Left ankle). Negative for neck pain and neck stiffness.  Skin: Negative for wound.  Neurological: Negative for dizziness and weakness.  Psychiatric/Behavioral: Negative for agitation and behavioral problems.       Objective:    Physical Exam Vitals reviewed.  Constitutional:      General: She is not in acute distress.    Appearance: She is not diaphoretic.  HENT:     Head: Normocephalic and atraumatic.     Nose: Nose normal. No congestion.     Mouth/Throat:     Mouth: Mucous membranes are moist.     Pharynx: No posterior oropharyngeal erythema.  Eyes:     General: No scleral icterus.    Extraocular Movements: Extraocular movements intact.     Pupils: Pupils are equal, round, and reactive to light.  Cardiovascular:     Rate and Rhythm: Normal rate and regular rhythm.     Heart sounds: No murmur heard.   Pulmonary:     Breath sounds: Normal breath sounds. No wheezing or rales.  Abdominal:     Palpations: Abdomen is soft.     Tenderness: There is no  abdominal tenderness.  Musculoskeletal:     Cervical back: Neck supple. No tenderness.     Right ankle: No swelling. Normal range of motion.     Left ankle: Swelling present. Tenderness present over the medial malleolus.  Skin:    General: Skin is warm.     Findings: No rash.  Neurological:     General: No focal deficit present.     Mental Status: She is alert and oriented to person, place, and time.  Psychiatric:  Mood and Affect: Mood normal.        Behavior: Behavior normal.     BP (!) 170/70 (BP Location: Right Arm, Patient Position: Sitting, Cuff Size: Normal)   Pulse 73   Temp 98.1 F (36.7 C) (Oral)   Resp 18   Ht 5\' 2"  (1.575 m)   Wt 144 lb 1.9 oz (65.4 kg)   SpO2 100%   BMI 26.36 kg/m  Wt Readings from Last 3 Encounters:  02/12/20 144 lb 1.9 oz (65.4 kg)  01/05/20 142 lb (64.4 kg)  11/27/19 143 lb 6.4 oz (65 kg)     There are no preventive care reminders to display for this patient.  There are no preventive care reminders to display for this patient.  Lab Results  Component Value Date   TSH 2.73 10/13/2019   Lab Results  Component Value Date   WBC 3.5 (L) 11/28/2019   HGB 12.2 11/28/2019   HCT 37.9 11/28/2019   MCV 86.1 11/28/2019   PLT 160 11/28/2019   Lab Results  Component Value Date   NA 138 11/28/2019   K 4.5 11/28/2019   CO2 28 11/28/2019   GLUCOSE 82 11/28/2019   BUN 16 11/28/2019   CREATININE 1.35 (H) 11/28/2019   BILITOT 0.5 11/28/2019   ALKPHOS 43 09/18/2016   AST 29 11/28/2019   ALT 20 11/28/2019   PROT 7.2 11/28/2019   ALBUMIN 3.8 09/18/2016   CALCIUM 9.8 11/28/2019   Lab Results  Component Value Date   CHOL 163 10/13/2019   Lab Results  Component Value Date   HDL 33 (L) 10/13/2019   Lab Results  Component Value Date   LDLCALC 100 (H) 10/13/2019   Lab Results  Component Value Date   TRIG 179 (H) 10/13/2019   Lab Results  Component Value Date   CHOLHDL 4.9 10/13/2019   Lab Results  Component Value Date    HGBA1C 5.9 (A) 10/01/2019      Assessment & Plan:   Problem List Items Addressed This Visit      Musculoskeletal and Integument   Systemic lupus erythematosus arthritis ([place]) - Primary    Left ankle and knee pain Check left ankle X-ray Prednisone Tylenol PRN Advised patient to contact Rheumatology office as well.      Relevant Medications   predniSONE (DELTASONE) 10 MG tablet     Other   Systemic lupus erythematosus ([facility])    On Hydroxychloroquine      Acute left ankle pain    No h/o injury or insect bite H/o SLE Will treat as SLE flare - Prednisone Check X-ray left ankle      Relevant Medications   predniSONE (DELTASONE) 10 MG tablet   Other Relevant Orders   DG Ankle Complete Left      Meds ordered this encounter  Medications  . predniSONE (DELTASONE) 10 MG tablet    Sig: Take 1 tablet (10 mg total) by mouth daily with breakfast.    Dispense:  10 tablet    Refill:  0    Follow-up: Return if symptoms worsen or fail to improve.    Lindell Spar, MD

## 2020-02-12 NOTE — Assessment & Plan Note (Signed)
On Hydroxychloroquine

## 2020-03-12 ENCOUNTER — Other Ambulatory Visit: Payer: Self-pay

## 2020-03-12 ENCOUNTER — Ambulatory Visit
Admission: RE | Admit: 2020-03-12 | Discharge: 2020-03-12 | Disposition: A | Discharge status: Home / Self Care | Payer: Medicare PPO | Source: Ambulatory Visit | Attending: Family Medicine | Admitting: Family Medicine

## 2020-03-12 DIAGNOSIS — Z1231 Encounter for screening mammogram for malignant neoplasm of breast: Secondary | ICD-10-CM

## 2020-04-05 ENCOUNTER — Other Ambulatory Visit: Payer: Self-pay

## 2020-04-05 ENCOUNTER — Telehealth (INDEPENDENT_AMBULATORY_CARE_PROVIDER_SITE_OTHER): Payer: Medicare PPO | Admitting: Family Medicine

## 2020-04-05 ENCOUNTER — Encounter: Payer: Self-pay | Admitting: Family Medicine

## 2020-04-05 VITALS — Ht 62.0 in | Wt 146.0 lb

## 2020-04-05 DIAGNOSIS — D126 Benign neoplasm of colon, unspecified: Secondary | ICD-10-CM | POA: Diagnosis not present

## 2020-04-05 DIAGNOSIS — M3219 Other organ or system involvement in systemic lupus erythematosus: Secondary | ICD-10-CM | POA: Diagnosis not present

## 2020-04-05 DIAGNOSIS — E559 Vitamin D deficiency, unspecified: Secondary | ICD-10-CM

## 2020-04-05 DIAGNOSIS — J309 Allergic rhinitis, unspecified: Secondary | ICD-10-CM | POA: Diagnosis not present

## 2020-04-05 DIAGNOSIS — Z79899 Other long term (current) drug therapy: Secondary | ICD-10-CM | POA: Diagnosis not present

## 2020-04-05 DIAGNOSIS — E782 Mixed hyperlipidemia: Secondary | ICD-10-CM

## 2020-04-05 MED ORDER — ALENDRONATE SODIUM 70 MG PO TABS
ORAL_TABLET | ORAL | 1 refills | Status: DC
Start: 2020-04-05 — End: 2020-10-18

## 2020-04-05 MED ORDER — ROGAINE WOMENS 5 % EX FOAM: CUTANEOUS | 0 refills | Status: DC

## 2020-04-05 MED ORDER — IPRATROPIUM BROMIDE 0.06 % NA SOLN: 2.0000 | Freq: Three times a day (TID) | NASAL | 3 refills | Status: DC

## 2020-04-05 NOTE — Patient Instructions (Addendum)
Annual physical exam in office with Md mid July, call if you need me sooner  Rogaine is sent in for hair loss and thinning  Nurse please call in 2 refill on atrovent nasal spray as before , if able. If unable please let me know  Next colonoscopy due in October of this year  Please reduce fried and fatty foods and continue regular exercise  Fasting lipid, TSH and vit D first week in July  Please be careful not to fall Wear mask, practice good hand hygiene, and maintain social distancing  Thanks for choosing [facility], we consider it a privelige to serve you.

## 2020-04-05 NOTE — Progress Notes (Signed)
Virtual Visit via Telephone Note  I connected with [name] on 04/05/20 at  8:20 AM EST by telephone and verified that I am speaking with the correct person using two identifiers.  Location: Patient: home Provider: work   I discussed the limitations, risks, security and privacy concerns of performing an evaluation and management service by telephone and the availability of in person appointments. I also discussed with the patient that there may be a patient responsible charge related to this service. The patient expressed understanding and agreed to proceed.   History of Present Illness: F/U chronic problems. C/o thinning and itching of hairline esp at forehead , saw Dermatology, no rx sent , using coconut oil and tea tree oil for approx 13 months, 50 c size at largest diameter Exercise 4 days / week for 2 hrs per session, does  Good sleep, Denies change in bM, appetite is good, denies abdominal pain Denies recent head or chest congestion Denies frequency, dysuria or incontinence Denies uncontrolled or disabling joint pains or stiffness    Observations/Objective: Ht 5\' 2"  (1.575 m)   Wt 146 lb (66.2 kg)   BMI 26.70 kg/m  Good communication with no confusion and intact memory. Alert and oriented x 3 No signs of respiratory distress during speech    Assessment and Plan: Tubular adenoma of colon rept colonscopy due this year  Hyperlipidemia Hyperlipidemia:Low fat diet discussed and encouraged.   Lipid Panel  Lab Results  Component Value Date   CHOL 163 10/13/2019   HDL 33 (L) 10/13/2019   LDLCALC 100 (H) 10/13/2019   TRIG 179 (H) 10/13/2019   CHOLHDL 4.9 10/13/2019   Updated lab needed at/ before next visit.     Allergic sinusitis Increased and uncontrolled symptoms medication is prescribed     Follow Up Instructions:    I discussed the assessment and treatment plan with the patient. The patient was provided an opportunity to ask questions and all  were answered. The patient agreed with the plan and demonstrated an understanding of the instructions.   The patient was advised to call back or seek an in-person evaluation if the symptoms worsen or if the condition fails to improve as anticipated.  I provided 22 minutes of non-face-to-face time during this encounter.   Tula Nakayama, MD

## 2020-04-06 ENCOUNTER — Encounter: Payer: Self-pay | Admitting: Internal Medicine

## 2020-04-07 DIAGNOSIS — N1831 Chronic kidney disease, stage 3a: Secondary | ICD-10-CM | POA: Diagnosis not present

## 2020-04-07 DIAGNOSIS — M3214 Glomerular disease in systemic lupus erythematosus: Secondary | ICD-10-CM | POA: Diagnosis not present

## 2020-04-07 DIAGNOSIS — R030 Elevated blood-pressure reading, without diagnosis of hypertension: Secondary | ICD-10-CM | POA: Diagnosis not present

## 2020-04-07 DIAGNOSIS — Z79899 Other long term (current) drug therapy: Secondary | ICD-10-CM | POA: Diagnosis not present

## 2020-04-07 NOTE — Assessment & Plan Note (Signed)
Increased and uncontrolled symptoms medication is prescribed

## 2020-04-07 NOTE — Assessment & Plan Note (Signed)
rept colonscopy due this year

## 2020-04-07 NOTE — Assessment & Plan Note (Signed)
Hyperlipidemia:Low fat diet discussed and encouraged.   Lipid Panel  Lab Results  Component Value Date   CHOL 163 10/13/2019   HDL 33 (L) 10/13/2019   LDLCALC 100 (H) 10/13/2019   TRIG 179 (H) 10/13/2019   CHOLHDL 4.9 10/13/2019   Updated lab needed at/ before next visit.

## 2020-04-09 DIAGNOSIS — M3214 Glomerular disease in systemic lupus erythematosus: Secondary | ICD-10-CM | POA: Diagnosis not present

## 2020-04-09 DIAGNOSIS — N1831 Chronic kidney disease, stage 3a: Secondary | ICD-10-CM | POA: Diagnosis not present

## 2020-04-09 DIAGNOSIS — I129 Hypertensive chronic kidney disease with stage 1 through stage 4 chronic kidney disease, or unspecified chronic kidney disease: Secondary | ICD-10-CM | POA: Diagnosis not present

## 2020-04-14 NOTE — Progress Notes (Signed)
Office Visit Note  Patient: [name]             Date of Birth: 12/18/1950           MRN: [id number]             PCP: [name], MD Referring: [name], MD Visit Date: 04/28/2020 Occupation: @GUAROCC @  Subjective:  Medication management.   History of Present Illness: [name] is a 70 y.o. female with history of systemic lupus and lupus nephritis.  She states she was seen recently by Dr. Theador Hawthorne.  Patient states her labs were stable.  No change in treatment was advised.  She denies any history of oral ulcers, nasal ulcers, malar rash, photosensitivity, sicca symptoms, Raynaud's phenomenon or inflammatory arthritis.  Activities of Daily Living:  Patient reports morning stiffness for 0 minutes.   Patient Denies nocturnal pain.  Difficulty dressing/grooming: Denies Difficulty climbing stairs: Denies Difficulty getting out of chair: Denies Difficulty using hands for taps, buttons, cutlery, and/or writing: Denies  Review of Systems  Constitutional: Negative for fatigue.  HENT: Negative for mouth sores, mouth dryness and nose dryness.   Eyes: Positive for dryness. Negative for pain and itching.  Respiratory: Negative for shortness of breath and difficulty breathing.   Cardiovascular: Negative for chest pain and palpitations.  Gastrointestinal: Positive for constipation. Negative for blood in stool and diarrhea.  Endocrine: Negative for increased urination.  Genitourinary: Negative for difficulty urinating.  Musculoskeletal: Negative for arthralgias, joint pain, joint swelling, myalgias, morning stiffness, muscle tenderness and myalgias.  Skin: Negative for color change, rash and redness.  Allergic/Immunologic: Negative for susceptible to infections.  Neurological: Negative for dizziness, numbness, headaches, memory loss and weakness.  Hematological: Negative for bruising/bleeding tendency.  Psychiatric/Behavioral: Negative for depressed mood,  confusion and sleep disturbance. The patient is not nervous/anxious.     PMFS History:  Patient Active Problem List   Diagnosis Date Noted  . Systemic lupus erythematosus arthritis ([place]) 02/12/2020  . Allergic sinusitis 11/04/2018  . Hyperlipidemia 03/08/2017  . Proteinuria 03/08/2017  . SLE glomerulonephritis syndrome ([place]) 09/15/2016  . High risk medication use 09/15/2016  . Degeneration of lumbar intervertebral disc 09/15/2016  . Personal history of other endocrine, nutritional and metabolic disease [id number]  . History of anemia 09/15/2016  . History of proteinuria  09/15/2016  . Dermatitis of face 09/15/2016  . Tubular adenoma of colon 04/30/2016  . Prediabetes 11/15/2014  . Osteopenia 07/21/2013  . Anemia 12/22/2012  . Systemic lupus erythematosus ([place]) 02/15/2012  . H/O: gastrointestinal disease 10/01/2007    Past Medical History:  Diagnosis Date  . Arthritis 02/13/10   abnormal MRI lumbar and c spine  with disc disease  . Osteoporosis   . SLE (systemic lupus erythematosus) ([place]) dx apprx 1990   reports immunotherapy, cytoxan and steroids at [facility], no f/u for over 99yrs    Family History  Problem Relation Age of Onset  . Heart disease Father   . Cancer Brother 35       breast  . Breast cancer Brother   . Cancer Sister 65       breast  . Breast cancer Sister   . Colon cancer Neg Hx    Past Surgical History:  Procedure Laterality Date  . ABDOMINAL HYSTERECTOMY  unsure, over 10 yrs   partial, has had abnormal pap  . BLADDER REPAIR    . COLONOSCOPY N/A 01/05/2014   Procedure: COLONOSCOPY;  Surgeon: Daneil Dolin, MD;  [facility];  Service: Endoscopy;  Laterality: N/A;  12:15 PM   Social History   Social History Narrative  . Not on file   Immunization History  Administered Date(s) Administered  . Fluad Quad(high Dose 65+) 11/18/2018, 01/05/2020  . Influenza Split 02/14/2012  . Influenza Whole 12/28/2000  . Influenza,inj,Quad PF,6+ Mos  12/16/2012, 01/06/2014, 01/19/2016, 12/10/2017  . Moderna Sars-Covid-2 Vaccination 05/02/2019, 05/31/2019, 01/29/2020  . Pneumococcal Conjugate-13 09/25/2017  . Pneumococcal Polysaccharide-23 09/30/2018  . Td 09/17/2003  . Tdap 01/06/2014  . Zoster 12/17/2012     Objective: Vital Signs: BP (!) 154/91 (BP Location: Left Arm, Patient Position: Sitting, Cuff Size: Normal)   Pulse 76   Resp 14   Ht 5\' 2"  (1.575 m)   Wt 145 lb (65.8 kg)   BMI 26.52 kg/m    Physical Exam Vitals and nursing note reviewed.  Constitutional:      Appearance: She is well-developed and well-nourished.  HENT:     Head: Normocephalic and atraumatic.  Eyes:     Extraocular Movements: EOM normal.     Conjunctiva/sclera: Conjunctivae normal.  Cardiovascular:     Rate and Rhythm: Normal rate and regular rhythm.     Pulses: Intact distal pulses.     Heart sounds: Normal heart sounds.  Pulmonary:     Effort: Pulmonary effort is normal.     Breath sounds: Normal breath sounds.  Abdominal:     General: Bowel sounds are normal.     Palpations: Abdomen is soft.  Musculoskeletal:     Cervical back: Normal range of motion.  Lymphadenopathy:     Cervical: No cervical adenopathy.  Skin:    General: Skin is warm and dry.     Capillary Refill: Capillary refill takes less than 2 seconds.  Neurological:     Mental Status: She is alert and oriented to person, place, and time.  Psychiatric:        Mood and Affect: Mood and affect normal.        Behavior: Behavior normal.      Musculoskeletal Exam: C-spine was in good range of motion.  Shoulder joints, elbow joints, wrist joints, MCPs PIPs and DIPs with good range of motion with no synovitis.  Hip joints, knee joints, ankles, MTPs and PIPs with good range of motion with no synovitis.  CDAI Exam: CDAI Score: -- Patient Global: --; Provider Global: -- Swollen: --; Tender: -- Joint Exam 04/28/2020   No joint exam has been documented for this visit   There is  currently no information documented on the homunculus. Go to the Rheumatology activity and complete the homunculus joint exam.  Investigation: No additional findings.  Imaging: No results found.  Recent Labs: Lab Results  Component Value Date   WBC 3.5 (L) 11/28/2019   HGB 12.2 11/28/2019   PLT 160 11/28/2019   NA 138 11/28/2019   K 4.5 11/28/2019   CL 103 11/28/2019   CO2 28 11/28/2019   GLUCOSE 82 11/28/2019   BUN 16 11/28/2019   CREATININE 1.35 (H) 11/28/2019   BILITOT 0.5 11/28/2019   ALKPHOS 43 09/18/2016   AST 29 11/28/2019   ALT 20 11/28/2019   PROT 7.2 11/28/2019   ALBUMIN 3.8 09/18/2016   CALCIUM 9.8 11/28/2019   GFRAA 47 (L) 11/28/2019   April 07, 2020 labs done by Dr. Theador Hawthorne showed CBC WBC 3.4, CMP creatinine 1.29, GFR 43, ANA 1: 80, positive RNP, positive Smith, double-stranded DNA 5, C3-C4 normal, UA showed trace esterase.  Speciality Comments: Plaquenil eye exam normal on 05/08/2019 per my eye doctor in [place]  Procedures:  No procedures performed Allergies: Sulfa antibiotics and Sulfonamide derivatives   Assessment / Plan:     Visit Diagnoses: Other systemic lupus erythematosus with other organ involvement ([place]) - history of dsDNA +64, Smith + 5.5, RNP+ 6.5, SSA([place]) Negative, SSB ([place]) Negative, presented initially with nephritis, hair loss, arthralgias, dermatitis.  Patient denies any history of oral ulcers, nasal ulcers, malar rash, photosensitivity or inflammatory arthritis.  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  She follows up with her nephrologist every year (Dr. Theador Hawthorne)  High risk medication use - PLQ 200 mg p.o. twice daily Monday through Friday. Plaquenil eye exam normal on 04/27/2020 was normal per patient.  Dermatitis-she had no active dermatitis.  History of renal insufficiency - followed by nephrology.  DDD (degenerative disc disease), lumbar-she denies any lower back discomfort  currently.  Age-related osteoporosis without current pathological fracture - Managed by Dr. Moshe Cipro.  She is on Fosamax 70 mg weekly started in October 2019.  Her GFR is above 35 and she can continue bisphosphonates.  History of hyperlipidemia  History of adenomatous polyp of colon  History of diverticulosis  History of proteinuria syndrome  History of anemia  History of prediabetes  Orders: No orders of the defined types were placed in this encounter.  No orders of the defined types were placed in this encounter.   Follow-Up Instructions: Return in about 5 months (around 09/25/2020) for Systemic lupus.   Bo Merino, MD  Note - This record has been created using Editor, commissioning.  Chart creation errors have been sought, but may not always  have been located. Such creation errors do not reflect on  the standard of medical care.

## 2020-04-26 DIAGNOSIS — M3210 Systemic lupus erythematosus, organ or system involvement unspecified: Secondary | ICD-10-CM | POA: Diagnosis not present

## 2020-04-26 DIAGNOSIS — Z79899 Other long term (current) drug therapy: Secondary | ICD-10-CM | POA: Diagnosis not present

## 2020-04-28 ENCOUNTER — Ambulatory Visit: Payer: Medicare PPO | Admitting: Rheumatology

## 2020-04-28 ENCOUNTER — Encounter: Payer: Self-pay | Admitting: Rheumatology

## 2020-04-28 ENCOUNTER — Other Ambulatory Visit: Payer: Self-pay

## 2020-04-28 VITALS — BP 154/91 | HR 76 | Resp 14 | Ht 62.0 in | Wt 145.0 lb

## 2020-04-28 DIAGNOSIS — M3214 Glomerular disease in systemic lupus erythematosus: Secondary | ICD-10-CM | POA: Diagnosis not present

## 2020-04-28 DIAGNOSIS — L309 Dermatitis, unspecified: Secondary | ICD-10-CM

## 2020-04-28 DIAGNOSIS — Z87448 Personal history of other diseases of urinary system: Secondary | ICD-10-CM

## 2020-04-28 DIAGNOSIS — Z86010 Personal history of colonic polyps: Secondary | ICD-10-CM | POA: Diagnosis not present

## 2020-04-28 DIAGNOSIS — Z8639 Personal history of other endocrine, nutritional and metabolic disease: Secondary | ICD-10-CM

## 2020-04-28 DIAGNOSIS — M810 Age-related osteoporosis without current pathological fracture: Secondary | ICD-10-CM

## 2020-04-28 DIAGNOSIS — Z87898 Personal history of other specified conditions: Secondary | ICD-10-CM

## 2020-04-28 DIAGNOSIS — M3219 Other organ or system involvement in systemic lupus erythematosus: Secondary | ICD-10-CM

## 2020-04-28 DIAGNOSIS — Z79899 Other long term (current) drug therapy: Secondary | ICD-10-CM

## 2020-04-28 DIAGNOSIS — Z862 Personal history of diseases of the blood and blood-forming organs and certain disorders involving the immune mechanism: Secondary | ICD-10-CM

## 2020-04-28 DIAGNOSIS — M5136 Other intervertebral disc degeneration, lumbar region: Secondary | ICD-10-CM | POA: Diagnosis not present

## 2020-04-28 DIAGNOSIS — Z8719 Personal history of other diseases of the digestive system: Secondary | ICD-10-CM

## 2020-05-10 DIAGNOSIS — Z79899 Other long term (current) drug therapy: Secondary | ICD-10-CM | POA: Diagnosis not present

## 2020-05-10 DIAGNOSIS — M3210 Systemic lupus erythematosus, organ or system involvement unspecified: Secondary | ICD-10-CM | POA: Diagnosis not present

## 2020-05-10 DIAGNOSIS — H3550 Unspecified hereditary retinal dystrophy: Secondary | ICD-10-CM | POA: Diagnosis not present

## 2020-05-10 DIAGNOSIS — H5203 Hypermetropia, bilateral: Secondary | ICD-10-CM | POA: Diagnosis not present

## 2020-05-10 DIAGNOSIS — H52223 Regular astigmatism, bilateral: Secondary | ICD-10-CM | POA: Diagnosis not present

## 2020-05-10 DIAGNOSIS — H524 Presbyopia: Secondary | ICD-10-CM | POA: Diagnosis not present

## 2020-05-10 DIAGNOSIS — H25813 Combined forms of age-related cataract, bilateral: Secondary | ICD-10-CM | POA: Diagnosis not present

## 2020-05-13 ENCOUNTER — Ambulatory Visit (INDEPENDENT_AMBULATORY_CARE_PROVIDER_SITE_OTHER): Payer: Self-pay | Admitting: *Deleted

## 2020-05-13 ENCOUNTER — Other Ambulatory Visit: Payer: Self-pay

## 2020-05-13 VITALS — Ht 62.0 in | Wt 146.8 lb

## 2020-05-13 DIAGNOSIS — Z86010 Personal history of colonic polyps: Secondary | ICD-10-CM

## 2020-05-13 MED ORDER — NA SULFATE-K SULFATE-MG SULF 17.5-3.13-1.6 GM/177ML PO SOLN: 1.0000 | Freq: Once | ORAL | 0 refills | Status: AC

## 2020-05-13 NOTE — Patient Instructions (Signed)
FREDDY SPADAFORA  15-Apr-1950 MRN: [id number]     Procedure Date: 07/07/2020 Time to register: You will receive a call from the hospital a few days before your procedure. Place to register: [facility] Scheduled provider: Dr. Gala Romney    PREPARATION FOR COLONOSCOPY WITH SUPREP BOWEL PREP KIT  Note: Suprep Bowel Prep Kit is a split-dose (2day) regimen. Consumption of BOTH 6-ounce bottles is required for a complete prep.  Please notify us immediately if you are diabetic, take iron supplements, or if you are on Coumadin or any other blood thinners.  Please hold the following medications: n/a                                                                                                                                                  2 DAYS BEFORE PROCEDURE:  DATE: 07/05/2020   DAY: Monday Begin clear liquid diet AFTER your lunch meal. NO SOLID FOODS after this point.  1 DAY BEFORE PROCEDURE:  DATE: 07/06/2020  DAY: Tuesday Continue clear liquids the entire day - NO SOLID FOOD.   Diabetic medications adjustments for today: n/a  At 8:00am: Complete steps 1 through 4 below, using ONE (1) 6-ounce bottle, before going to bed. Step 1:  Pour ONE (1) 6-ounce bottle of SUPREP liquid into the mixing container.  Step 2:  Add cool drinking water to the 16 ounce line on the container and mix.  Note: Dilute the solution concentrate as directed prior to use. Step 3:  DRINK ALL the liquid in the container. Step 4:  You MUST drink an additional two (2) or more 16 ounce containers of water over the next one (1) hour.    At 6:00pm: Complete steps 1 through 4 below, using ONE (1) 6-ounce bottle, before going to bed. Step 1:  Pour ONE (1) 6-ounce bottle of SUPREP liquid into the mixing container.  Step 2:  Add cool drinking water to the 16 ounce line on the container and mix.  Note: Dilute the solution concentrate as directed prior to use. Step 3:  DRINK ALL the liquid in the container. Step 4:   You MUST drink an additional two (2) or more 16 ounce containers of water over the next one (1) hour.   Continue clear liquids until midnight.  Nothing by mouth after midnight.  DAY OF PROCEDURE:   DATE: 07/07/2020  DAY: Wednesday If you take medications for your heart, blood pressure, or breathing, you may take these medications.  Diabetic medications adjustments for today: n/a    Please see below for Dietary Information.  CLEAR LIQUIDS INCLUDE:  Water Jello (NOT red in color)   Ice Popsicles (NOT red in color)   Tea (sugar ok, no milk/cream) Powdered fruit flavored drinks  Coffee (sugar ok, no milk/cream) Gatorade/ Lemonade/ Kool-Aid  (NOT red in color)   Juice:  apple, white grape, white cranberry Soft drinks  Clear bullion, consomme, broth (fat free beef/chicken/vegetable)  Carbonated beverages (any kind)  Strained chicken noodle soup Hard Candy   Remember: Clear liquids are liquids that will allow you to see your fingers on the other side of a clear glass. Be sure liquids are NOT red in color, and not cloudy, but CLEAR.  DO NOT EAT OR DRINK ANY OF THE FOLLOWING:  Dairy products of any kind   Cranberry juice Tomato juice / V8 juice   Grapefruit juice Orange juice     Red grape juice  Do not eat any solid foods, including such foods as: cereal, oatmeal, yogurt, fruits, vegetables, creamed soups, eggs, bread, crackers, pureed foods in a blender, etc.   HELPFUL HINTS FOR DRINKING PREP SOLUTION:   Make sure prep is extremely cold. Mix and refrigerate the the morning of the prep. You may also put in the freezer.   You may try mixing some Crystal Light or Country Time Lemonade if you prefer. Mix in small amounts; add more if necessary.  Try drinking through a straw  Rinse mouth with water or a mouthwash between glasses, to remove after-taste.  Try sipping on a cold beverage /ice/ popsicles between glasses of prep.  Place a piece of sugar-free hard candy in mouth between  glasses.  If you become nauseated, try consuming smaller amounts, or stretch out the time between glasses. Stop for 30-60 minutes, then slowly start back drinking.     OTHER INSTRUCTIONS  You will need a responsible adult at least 68 years of age to accompany you and drive you home. This person must remain in the waiting room during your procedure. The hospital will cancel your procedure if you do not have a responsible adult with you.   1. Wear loose fitting clothing that is easily removed. 2. Leave jewelry and other valuables at home.  3. Remove all body piercing jewelry and leave at home. 4. Total time from sign-in until discharge is approximately 2-3 hours. 5. You should go home directly after your procedure and rest. You can resume normal activities the day after your procedure. 6. The day of your procedure you should not:  Drive  Make legal decisions  Operate machinery  Drink alcohol  Return to work   You may call the office (Dept: [phone]) before 5:00pm, or page the doctor on call ([phone]) after 5:00pm, for further instructions, if necessary.   Insurance Information YOU WILL NEED TO CHECK WITH YOUR INSURANCE COMPANY FOR THE BENEFITS OF COVERAGE YOU HAVE FOR THIS PROCEDURE.  UNFORTUNATELY, NOT ALL INSURANCE COMPANIES HAVE BENEFITS TO COVER ALL OR PART OF THESE TYPES OF PROCEDURES.  IT IS YOUR RESPONSIBILITY TO CHECK YOUR BENEFITS, HOWEVER, WE WILL BE GLAD TO ASSIST YOU WITH ANY CODES YOUR INSURANCE COMPANY MAY NEED.    PLEASE NOTE THAT MOST INSURANCE COMPANIES WILL NOT COVER A SCREENING COLONOSCOPY FOR PEOPLE UNDER THE AGE OF 50  IF YOU HAVE BCBS INSURANCE, YOU MAY HAVE BENEFITS FOR A SCREENING COLONOSCOPY BUT IF POLYPS ARE FOUND THE DIAGNOSIS WILL CHANGE AND THEN YOU MAY HAVE A DEDUCTIBLE THAT WILL NEED TO BE MET. SO PLEASE MAKE SURE YOU CHECK YOUR BENEFITS FOR A SCREENING COLONOSCOPY AS WELL AS A DIAGNOSTIC COLONOSCOPY.      

## 2020-05-13 NOTE — Progress Notes (Addendum)
Gastroenterology Pre-Procedure Review  Request Date: 05/13/2020 Requesting Physician: Dr. Moshe Cipro, Last TCS 01/05/2014 by Dr. Gala Romney, tubular adenoma  PATIENT REVIEW QUESTIONS: The patient responded to the following health history questions as indicated:    1. Diabetes Melitis: no 2. Joint replacements in the past 12 months: no 3. Major health problems in the past 3 months: no 4. Has an artificial valve or MVP: no 5. Has a defibrillator: no 6. Has been advised in past to take antibiotics in advance of a procedure like teeth cleaning: no 7. Family history of colon cancer: no  8. Alcohol Use: no 9. Illicit drug Use: no 10. History of sleep apnea: no  11. History of coronary artery or other vascular stents placed within the last 12 months: no 12. History of any prior anesthesia complications: no 13. Body mass index is 26.85 kg/m.    MEDICATIONS & ALLERGIES:    Patient reports the following regarding taking any blood thinners:   Plavix? no Aspirin? no Coumadin? no Brilinta? no Xarelto? no Eliquis? no Pradaxa? no Savaysa? no Effient? no  Patient confirms/reports the following medications:  Current Outpatient Medications  Medication Sig Dispense Refill  . alendronate (FOSAMAX) 70 MG tablet TAKE ONE TABLET (70MG  TOTAL) BY MOUTH EVERY 7 DAYS. TAKE WITH A FULL GLASS OF WATER ON AN EMPTY STOMACH. 12 tablet 1  . Ascorbic Acid (VITA-C PO) Take by mouth daily.    . calcium gluconate 500 MG tablet Take 1 tablet by mouth daily.    . cyanocobalamin 1000 MCG tablet Take 1,000 mcg by mouth daily.    Marland Kitchen GARLIC PO Take 1 tablet by mouth daily.    . hydroxychloroquine (PLAQUENIL) 200 MG tablet TAKE ONE TABLET BY MOUTH TWICE A DAY MONDAY THROUGH FRIDAY. 120 tablet 0  . ipratropium (ATROVENT) 0.06 % nasal spray Place 2 sprays into both nostrils 3 (three) times daily. (Patient taking differently: Place 2 sprays into both nostrils as needed.) 15 mL 3  . Multiple Vitamin (MULTIVITAMIN) capsule Take 1  capsule by mouth daily.    . Multiple Vitamins-Minerals (SYSTANE ICAPS AREDS2) CAPS Take by mouth.    . Vitamin A 2400 MCG (8000 UT) TABS Take by mouth.    Marland Kitchen VITAMIN D PO Take by mouth.     No current facility-administered medications for this visit.    Patient confirms/reports the following allergies:  Allergies  Allergen Reactions  . Sulfa Antibiotics   . Sulfonamide Derivatives     No orders of the defined types were placed in this encounter.   AUTHORIZATION INFORMATION Primary Insurance: [place],  ID #: [id number] Pre-Cert / Josem Kaufmann required: Yes, approved by [facility] 07/11/3843-3/[id number]/6803 Pre-Cert / Josem Kaufmann #: [id number]  SCHEDULE INFORMATION: Procedure has been scheduled as follows:  Date: 07/07/2020, Time: AM procedure Location: [facility] with Dr. Gala Romney  This Gastroenterology Pre-Precedure Review Form is being routed to the following provider(s): Roseanne Kaufman, NP

## 2020-05-21 ENCOUNTER — Other Ambulatory Visit: Payer: Self-pay | Admitting: Physician Assistant

## 2020-05-21 DIAGNOSIS — M3219 Other organ or system involvement in systemic lupus erythematosus: Secondary | ICD-10-CM

## 2020-05-21 NOTE — Telephone Encounter (Signed)
Last Visit: 04/28/2020 Next Visit: 09/29/2020 Labs:April 07, 2020 labs done by Dr. Theador Hawthorne showed CBC WBC 3.4, CMP creatinine 1.29, GFR 43, ANA 1: 80, positive RNP, positive Smith, double-stranded DNA 5, C3-C4 normal, UA showed trace esterase. Eye exam: 04/26/2020  Current Dose per office note 04/28/2020,  PLQ 200 mg p.o. twice daily Monday through Friday. QI:HKVQQ systemic lupus erythematosus with other organ involvement   Last Fill: 02/10/2020  Okay to refill Plaquenil?

## 2020-05-24 NOTE — Progress Notes (Signed)
ASA 2. Appropriate.  ?

## 2020-05-25 ENCOUNTER — Other Ambulatory Visit: Payer: Self-pay | Admitting: *Deleted

## 2020-06-18 DIAGNOSIS — H353131 Nonexudative age-related macular degeneration, bilateral, early dry stage: Secondary | ICD-10-CM | POA: Diagnosis not present

## 2020-06-18 DIAGNOSIS — H25813 Combined forms of age-related cataract, bilateral: Secondary | ICD-10-CM | POA: Diagnosis not present

## 2020-06-18 DIAGNOSIS — H04123 Dry eye syndrome of bilateral lacrimal glands: Secondary | ICD-10-CM | POA: Diagnosis not present

## 2020-06-18 DIAGNOSIS — H35363 Drusen (degenerative) of macula, bilateral: Secondary | ICD-10-CM | POA: Diagnosis not present

## 2020-06-18 DIAGNOSIS — H35453 Secondary pigmentary degeneration, bilateral: Secondary | ICD-10-CM | POA: Diagnosis not present

## 2020-06-18 DIAGNOSIS — H35033 Hypertensive retinopathy, bilateral: Secondary | ICD-10-CM | POA: Diagnosis not present

## 2020-07-05 ENCOUNTER — Other Ambulatory Visit: Payer: Self-pay

## 2020-07-05 ENCOUNTER — Other Ambulatory Visit (HOSPITAL_COMMUNITY)
Admission: RE | Admit: 2020-07-05 | Discharge: 2020-07-05 | Disposition: A | Discharge status: Home / Self Care | Payer: Medicare PPO | Source: Ambulatory Visit | Attending: Internal Medicine | Admitting: Internal Medicine

## 2020-07-05 ENCOUNTER — Telehealth: Payer: Self-pay

## 2020-07-05 DIAGNOSIS — Z20822 Contact with and (suspected) exposure to covid-19: Secondary | ICD-10-CM | POA: Diagnosis not present

## 2020-07-05 DIAGNOSIS — Z01812 Encounter for preprocedural laboratory examination: Secondary | ICD-10-CM | POA: Insufficient documentation

## 2020-07-05 LAB — SARS CORONAVIRUS 2 (TAT 6-24 HRS): SARS Coronavirus 2: NEGATIVE

## 2020-07-05 NOTE — Telephone Encounter (Signed)
Spoke to pt.  She was instructed to start at 8am, take 1 capful of Miralax in water/juice (8 ounces) for 3 doses (e.g. 8am, 9am, 10am).  Pt advised to take Suprep at 6 pm as instructed on her prep instructions.  Pt voiced understanding.

## 2020-07-05 NOTE — Telephone Encounter (Signed)
Pt drank one bottle of Suprep at 2:00 today be accident.  She was suppose to start it tomorrow morning.  What are your recommendations?

## 2020-07-05 NOTE — Telephone Encounter (Signed)
Starting at 8am, take 1 capful of Miralax in water/juice (8 ounces) for 3 doses (e.g. 8am, 9am, 10am).

## 2020-07-05 NOTE — Telephone Encounter (Signed)
Continue with clears strictly.   Tomorrow morning at 8am, take 1 capful of Miralax in 8 ounces of water every hour times  doses.   Pick up with the prep at 6pm using suprep as previously outlined.

## 2020-07-05 NOTE — Telephone Encounter (Signed)
Please call pt. Pt feels she started her prep a day earlier than she was suppose to. Pt wants to have her procedure. Please advise pt.

## 2020-07-07 ENCOUNTER — Ambulatory Visit (HOSPITAL_COMMUNITY)
Admission: RE | Admit: 2020-07-07 | Discharge: 2020-07-07 | Disposition: A | Discharge status: Home / Self Care | Payer: Medicare PPO | Attending: Internal Medicine | Admitting: Internal Medicine

## 2020-07-07 ENCOUNTER — Other Ambulatory Visit: Payer: Self-pay

## 2020-07-07 ENCOUNTER — Encounter (HOSPITAL_COMMUNITY)
Admission: RE | Disposition: A | Discharge status: Home / Self Care | Payer: Self-pay | Source: Home / Self Care | Attending: Internal Medicine

## 2020-07-07 ENCOUNTER — Encounter (HOSPITAL_COMMUNITY): Payer: Self-pay | Admitting: Internal Medicine

## 2020-07-07 DIAGNOSIS — Z881 Allergy status to other antibiotic agents status: Secondary | ICD-10-CM | POA: Insufficient documentation

## 2020-07-07 DIAGNOSIS — K5730 Diverticulosis of large intestine without perforation or abscess without bleeding: Secondary | ICD-10-CM | POA: Insufficient documentation

## 2020-07-07 DIAGNOSIS — Z86010 Personal history of colonic polyps: Secondary | ICD-10-CM | POA: Insufficient documentation

## 2020-07-07 DIAGNOSIS — Q438 Other specified congenital malformations of intestine: Secondary | ICD-10-CM | POA: Diagnosis not present

## 2020-07-07 DIAGNOSIS — Z1211 Encounter for screening for malignant neoplasm of colon: Secondary | ICD-10-CM | POA: Insufficient documentation

## 2020-07-07 HISTORY — PX: COLONOSCOPY: SHX5424

## 2020-07-07 SURGERY — COLONOSCOPY
Anesthesia: Moderate Sedation

## 2020-07-07 MED ORDER — MEPERIDINE HCL 50 MG/ML IJ SOLN
INTRAMUSCULAR | Status: AC
  Filled 2020-07-07: qty 1

## 2020-07-07 MED ORDER — MIDAZOLAM HCL 5 MG/5ML IJ SOLN
INTRAMUSCULAR | Status: DC | PRN
  Administered 2020-07-07: 1 mg via INTRAVENOUS
  Administered 2020-07-07: 2 mg via INTRAVENOUS
  Administered 2020-07-07 (×4): 1 mg via INTRAVENOUS

## 2020-07-07 MED ORDER — ONDANSETRON HCL 4 MG/2ML IJ SOLN
INTRAMUSCULAR | Status: AC
  Filled 2020-07-07: qty 2

## 2020-07-07 MED ORDER — MEPERIDINE HCL 100 MG/ML IJ SOLN
INTRAMUSCULAR | Status: DC | PRN
  Administered 2020-07-07: 15 mg via INTRAVENOUS
  Administered 2020-07-07: 25 mg via INTRAVENOUS

## 2020-07-07 MED ORDER — STERILE WATER FOR IRRIGATION IR SOLN
Status: DC | PRN
  Administered 2020-07-07: 200 mL

## 2020-07-07 MED ORDER — SODIUM CHLORIDE 0.9 % IV SOLN
INTRAVENOUS | Status: DC
  Administered 2020-07-07: 1000 mL via INTRAVENOUS

## 2020-07-07 MED ORDER — MIDAZOLAM HCL 5 MG/5ML IJ SOLN
INTRAMUSCULAR | Status: AC
  Filled 2020-07-07: qty 10

## 2020-07-07 MED ORDER — ONDANSETRON HCL 4 MG/2ML IJ SOLN
INTRAMUSCULAR | Status: DC | PRN
  Administered 2020-07-07: 4 mg via INTRAVENOUS

## 2020-07-07 NOTE — Op Note (Signed)
[facility] Patient Name: [name] Procedure Date: 07/07/2020 10:33 AM MRN: [id number] Date of Birth: 02-07-1951 Attending MD: Norvel Richards , MD CSN: [id number] Age: 70 Admit Type: Outpatient Procedure:                Colonoscopy Indications:              High risk colon cancer surveillance: Personal                            history of colonic polyps Providers:                Norvel Richards, MD, Lurline Del, RN, Wynonia Musty Tech, Technician Referring MD:              Medicines:                Midazolam 7 mg IV, Meperidine 40 mg IV Complications:            No immediate complications. Estimated Blood Loss:     Estimated blood loss was minimal. Estimated blood                            loss: none. Procedure:                Pre-Anesthesia Assessment:                           - Prior to the procedure, a History and Physical                            was performed, and patient medications and                            allergies were reviewed. The patient's tolerance of                            previous anesthesia was also reviewed. The risks                            and benefits of the procedure and the sedation                            options and risks were discussed with the patient.                            All questions were answered, and informed consent                            was obtained. Prior Anticoagulants: The patient has                            taken no previous anticoagulant or antiplatelet  agents. ASA Grade Assessment: II - A patient with                            mild systemic disease. After reviewing the risks                            and benefits, the patient was deemed in                            satisfactory condition to undergo the procedure.                           After obtaining informed consent, the colonoscope                            was passed under  direct vision. Throughout the                            procedure, the patient's blood pressure, pulse, and                            oxygen saturations were monitored continuously. The                            CF-HQ190L ([id number]) scope was introduced through                            the anus and advanced to the the cecum, identified                            by appendiceal orifice and ileocecal valve. The                            colonoscopy was performed without difficulty. The                            patient tolerated the procedure well. The quality                            of the bowel preparation was adequate. Scope In: 10:56:26 AM Scope Out: 11:19:22 AM Scope Withdrawal Time: 0 hours 7 minutes 23 seconds  Total Procedure Duration: 0 hours 22 minutes 56 seconds  Findings:      The perianal and digital rectal examinations were normal.      Multiple small and large-mouthed diverticula were found in the entire       colon. Redundant left colon required external abdominal pressure to       reach the cecum.      The exam was otherwise without abnormality on direct and retroflexion       views. Impression:               - Diverticulosis in the entire examined colon.                            Redundant  colon.                           - The examination was otherwise normal on direct                            views. - No specimens collected. Moderate Sedation:      Moderate (conscious) sedation was personally administered by an       anesthesia professional. The following parameters were monitored: oxygen       saturation, heart rate, blood pressure, respiratory rate, EKG, adequacy       of pulmonary ventilation, and response to care. Recommendation:           - Patient has a contact number available for                            emergencies. The signs and symptoms of potential                            delayed complications were discussed with the                             patient. Return to normal activities tomorrow.                            Written discharge instructions were provided to the                            patient.                           - Resume previous diet.                           - Continue present medications.                           - No repeat colonoscopy due to age.                           - Return to GI clinic (date not yet determined). Procedure Code(s):        --- Professional ---                           [phone], Colonoscopy, flexible; diagnostic, including                            collection of specimen(s) by brushing or washing,                            when performed (separate procedure) Diagnosis Code(s):        --- Professional ---                           Z86.010, Personal history of colonic polyps  K57.30, Diverticulosis of large intestine without                            perforation or abscess without bleeding CPT copyright 2019 American Medical Association. All rights reserved. The codes documented in this report are preliminary and upon coder review may  be revised to meet current compliance requirements. Cristopher Estimable. Meigan Pates, MD Norvel Richards, MD 07/07/2020 11:32:36 AM This report has been signed electronically. Number of Addenda: 0

## 2020-07-07 NOTE — H&P (Signed)
@LOGO @   Primary Care Physician:  Ariel Helper, MD Primary Gastroenterologist:  Dr. Gala Romney  Pre-Procedure History & Physical: HPI:  [name] is a 70 y.o. female here for surveillance colonoscopy.  History of colonic adenoma removed previously.  No bowel symptoms currently.  Past Medical History:  Diagnosis Date  . Arthritis 02/13/10   abnormal MRI lumbar and c spine  with disc disease  . Osteoporosis   . SLE (systemic lupus erythematosus) ([place]) dx apprx 1990   reports immunotherapy, cytoxan and steroids at [facility], no f/u for over 68yrs    Past Surgical History:  Procedure Laterality Date  . ABDOMINAL HYSTERECTOMY  unsure, over 10 yrs   partial, has had abnormal pap  . BLADDER REPAIR    . COLONOSCOPY N/A 01/05/2014   Procedure: COLONOSCOPY;  Surgeon: Daneil Dolin, MD;  Location: [facility];  Service: Endoscopy;  Laterality: N/A;  12:15 PM    Prior to Admission medications   Medication Sig Start Date End Date Taking? Authorizing Provider  cholecalciferol (VITAMIN D3) 25 MCG (1000 UNIT) tablet Take 2,000 Units by mouth daily.   Yes [provider]  Cyanocobalamin (B-12 PO) Take 1 tablet by mouth daily.   Yes [provider]  Garlic 7619 MG CAPS Take 1,000 mg by mouth daily.   Yes [provider]  hydroxychloroquine (PLAQUENIL) 200 MG tablet TAKE ONE TABLET BY MOUTH TWICE A DAY MONDAY THROUGH FRIDAY. Patient taking differently: Take 400 mg by mouth every Monday, Tuesday, Wednesday, Thursday, and Friday. 05/24/20  Yes Ofilia Neas, PA-C  ipratropium (ATROVENT) 0.06 % nasal spray Place 2 sprays into both nostrils 3 (three) times daily. Patient taking differently: Place 2 sprays into both nostrils as needed for rhinitis. 04/05/20  Yes Ariel Helper, MD  Multiple Vitamin (MULTIVITAMIN) capsule Take 1 capsule by mouth daily.   Yes [provider]  Multiple Vitamins-Minerals (SYSTANE ICAPS AREDS2) CAPS Take 2 capsules by mouth daily.    Yes [provider]  vitamin C (ASCORBIC ACID) 500 MG tablet Take 1,000 mg by mouth daily.   Yes [provider]  alendronate (FOSAMAX) 70 MG tablet TAKE ONE TABLET (70MG  TOTAL) BY MOUTH EVERY 7 DAYS. TAKE WITH A FULL GLASS OF WATER ON AN EMPTY STOMACH. Patient taking differently: Take 70 mg by mouth every Monday. TAKE WITH A FULL GLASS OF WATER ON AN EMPTY STOMACH. 04/05/20   Ariel Helper, MD  Calcium Carb-Cholecalciferol (CALCIUM 600+D) 600-800 MG-UNIT TABS Take 2 tablets by mouth daily.    [provider]  VITAMIN A PO Take 1 capsule by mouth daily.    [provider]    Allergies as of 05/24/2020 - Review Complete 05/13/2020  Allergen Reaction Noted  . Sulfa antibiotics  03/13/2007  . Sulfonamide derivatives  03/13/2007    Family History  Problem Relation Age of Onset  . Heart disease Father   . Cancer Brother 19       breast  . Breast cancer Brother   . Cancer Sister 41       breast  . Breast cancer Sister   . Colon cancer Neg Hx     Social History   Socioeconomic History  . Marital status: Married    Spouse name: Not on file  . Number of children: Not on file  . Years of education: Not on file  . Highest education level: Not on file  Occupational History  . Not on file  Tobacco Use  .  Smoking status: Never Smoker  . Smokeless tobacco: Never Used  Vaping Use  . Vaping Use: Never used  Substance and Sexual Activity  . Alcohol use: No  . Drug use: No  . Sexual activity: Not Currently  Other Topics Concern  . Not on file  Social History Narrative  . Not on file   Social Determinants of Health   Financial Resource Strain: Low Risk   . Difficulty of Paying Living Expenses: Not hard at all  Food Insecurity: No Food Insecurity  . Worried About Charity fundraiser in the Last Year: Never true  . Ran Out of Food in the Last Year: Never true  Transportation Needs: No Transportation Needs  . Lack of Transportation  (Medical): No  . Lack of Transportation (Non-Medical): No  Physical Activity: Insufficiently Active  . Days of Exercise per Week: 3 days  . Minutes of Exercise per Session: 20 min  Stress: No Stress Concern Present  . Feeling of Stress : Only a little  Social Connections: Socially Integrated  . Frequency of Communication with Friends and Family: More than three times a week  . Frequency of Social Gatherings with Friends and Family: More than three times a week  . Attends Religious Services: More than 4 times per year  . Active Member of Clubs or Organizations: Yes  . Attends Archivist Meetings: 1 to 4 times per year  . Marital Status: Married  Human resources officer Violence: Not At Risk  . Fear of Current or Ex-Partner: No  . Emotionally Abused: No  . Physically Abused: No  . Sexually Abused: No    Review of Systems: See HPI, otherwise negative ROS  Physical Exam: BP 115/78   Pulse 80   Temp 98 F (36.7 C) (Oral)   Resp 15   Ht 5\' 2"  (1.575 m)   Wt 63.5 kg   SpO2 100%   BMI 25.61 kg/m  General:   Alert,  Well-developed, well-nourished, pleasant and cooperative in NAD Neck:  Supple; no masses or thyromegaly. No significant cervical adenopathy. Lungs:  Clear throughout to auscultation.   No wheezes, crackles, or rhonchi. No acute distress. Heart:  Regular rate and rhythm; no murmurs, clicks, rubs,  or gallops. Abdomen: Non-distended, normal bowel sounds.  Soft and nontender without appreciable mass or hepatosplenomegaly.  Pulses:  Normal pulses noted. Extremities:  Without clubbing or edema.  Impression/Plan: 70 year old lady here for a surveillance colonoscopy.  History colonic adenoma. The risks, benefits, limitations, alternatives and imponderables have been reviewed with the patient. Questions have been answered. All parties are agreeable.      Notice: This dictation was prepared with Dragon dictation along with smaller phrase technology. Any transcriptional  errors that result from this process are unintentional and may not be corrected upon review.

## 2020-07-07 NOTE — Discharge Instructions (Signed)
Colonoscopy Discharge Instructions  Read the instructions outlined below and refer to this sheet in the next few weeks. These discharge instructions provide you with general information on caring for yourself after you leave the hospital. Your doctor may also give you specific instructions. While your treatment has been planned according to the most current medical practices available, unavoidable complications occasionally occur. If you have any problems or questions after discharge, call Dr. Gala Romney at [phone]. ACTIVITY  You may resume your regular activity, but move at a slower pace for the next 24 hours.   Take frequent rest periods for the next 24 hours.   Walking will help get rid of the air and reduce the bloated feeling in your belly (abdomen).   No driving for 24 hours (because of the medicine (anesthesia) used during the test).    Do not sign any important legal documents or operate any machinery for 24 hours (because of the anesthesia used during the test).  NUTRITION  Drink plenty of fluids.   You may resume your normal diet as instructed by your doctor.   Begin with a light meal and progress to your normal diet. Heavy or fried foods are harder to digest and may make you feel sick to your stomach (nauseated).   Avoid alcoholic beverages for 24 hours or as instructed.  MEDICATIONS  You may resume your normal medications unless your doctor tells you otherwise.  WHAT YOU CAN EXPECT TODAY  Some feelings of bloating in the abdomen.   Passage of more gas than usual.   Spotting of blood in your stool or on the toilet paper.  IF YOU HAD POLYPS REMOVED DURING THE COLONOSCOPY:  No aspirin products for 7 days or as instructed.   No alcohol for 7 days or as instructed.   Eat a soft diet for the next 24 hours.  FINDING OUT THE RESULTS OF YOUR TEST Not all test results are available during your visit. If your test results are not back during the visit, make an appointment  with your caregiver to find out the results. Do not assume everything is normal if you have not heard from your caregiver or the medical facility. It is important for you to follow up on all of your test results.  SEEK IMMEDIATE MEDICAL ATTENTION IF:  You have more than a spotting of blood in your stool.   Your belly is swollen (abdominal distention).   You are nauseated or vomiting.   You have a temperature over 101.   You have abdominal pain or discomfort that is severe or gets worse throughout the day.    Diverticulosis only found today.  No polyps.  Future colonoscopy is not recommended unless new symptoms develop  At patient request, I called Roseanna Rainbow at [phone]-reviewed findings and recommendations      Diverticulosis  Diverticulosis is a condition that develops when small pouches (diverticula) form in the wall of the large intestine (colon). The colon is where water is absorbed and stool (feces) is formed. The pouches form when the inside layer of the colon pushes through weak spots in the outer layers of the colon. You may have a few pouches or many of them. The pouches usually do not cause problems unless they become inflamed or infected. When this happens, the condition is called diverticulitis. What are the causes? The cause of this condition is not known. What increases the risk? The following factors may make you more likely to develop this condition:  Being  older than age 65. Your risk for this condition increases with age. Diverticulosis is rare among people younger than age 29. By age 61, many people have it.  Eating a low-fiber diet.  Having frequent constipation.  Being overweight.  Not getting enough exercise.  Smoking.  Taking over-the-counter pain medicines, like aspirin and ibuprofen.  Having a family history of diverticulosis. What are the signs or symptoms? In most people, there are no symptoms of this condition. If you do have  symptoms, they may include:  Bloating.  Cramps in the abdomen.  Constipation or diarrhea.  Pain in the lower left side of the abdomen. How is this diagnosed? Because diverticulosis usually has no symptoms, it is most often diagnosed during an exam for other colon problems. The condition may be diagnosed by:  Using a flexible scope to examine the colon (colonoscopy).  Taking an X-ray of the colon after dye has been put into the colon (barium enema).  Having a CT scan. How is this treated? You may not need treatment for this condition. Your health care provider may recommend treatment to prevent problems. You may need treatment if you have symptoms or if you previously had diverticulitis. Treatment may include:  Eating a high-fiber diet.  Taking a fiber supplement.  Taking a live bacteria supplement (probiotic).  Taking medicine to relax your colon.   Follow these instructions at home: Medicines  Take over-the-counter and prescription medicines only as told by your health care provider.  If told by your health care provider, take a fiber supplement or probiotic. Constipation prevention Your condition may cause constipation. To prevent or treat constipation, you may need to:  Drink enough fluid to keep your urine pale yellow.  Take over-the-counter or prescription medicines.  Eat foods that are high in fiber, such as beans, whole grains, and fresh fruits and vegetables.  Limit foods that are high in fat and processed sugars, such as fried or sweet foods.   General instructions  Try not to strain when you have a bowel movement.  Keep all follow-up visits as told by your health care provider. This is important. Contact a health care provider if you:  Have pain in your abdomen.  Have bloating.  Have cramps.  Have not had a bowel movement in 3 days. Get help right away if:  Your pain gets worse.  Your bloating becomes very bad.  You have a fever or chills, and  your symptoms suddenly get worse.  You vomit.  You have bowel movements that are bloody or black.  You have bleeding from your rectum. Summary  Diverticulosis is a condition that develops when small pouches (diverticula) form in the wall of the large intestine (colon).  You may have a few pouches or many of them.  This condition is most often diagnosed during an exam for other colon problems.  Treatment may include increasing the fiber in your diet, taking supplements, or taking medicines. This information is not intended to replace advice given to you by your health care provider. Make sure you discuss any questions you have with your health care provider. Document Revised: 10/10/2018 Document Reviewed: 10/10/2018 Elsevier Patient Education  [place].

## 2020-07-12 ENCOUNTER — Telehealth: Payer: Self-pay

## 2020-07-12 ENCOUNTER — Encounter (HOSPITAL_COMMUNITY): Payer: Self-pay | Admitting: Internal Medicine

## 2020-07-12 NOTE — Telephone Encounter (Signed)
error 

## 2020-07-19 ENCOUNTER — Encounter: Payer: Self-pay | Admitting: Family Medicine

## 2020-07-19 ENCOUNTER — Ambulatory Visit: Payer: Medicare PPO | Admitting: Family Medicine

## 2020-07-19 ENCOUNTER — Other Ambulatory Visit: Payer: Self-pay

## 2020-07-19 VITALS — BP 142/90 | HR 66 | Temp 97.4°F | Ht 62.0 in | Wt 143.0 lb

## 2020-07-19 DIAGNOSIS — E663 Overweight: Secondary | ICD-10-CM | POA: Diagnosis not present

## 2020-07-19 DIAGNOSIS — R030 Elevated blood-pressure reading, without diagnosis of hypertension: Secondary | ICD-10-CM | POA: Diagnosis not present

## 2020-07-19 DIAGNOSIS — L219 Seborrheic dermatitis, unspecified: Secondary | ICD-10-CM

## 2020-07-19 DIAGNOSIS — I10 Essential (primary) hypertension: Secondary | ICD-10-CM | POA: Insufficient documentation

## 2020-07-19 MED ORDER — CLOBETASOL PROPIONATE 0.05 % EX SOLN
CUTANEOUS | 0 refills | Status: DC
Start: 2020-07-19 — End: 2020-10-04

## 2020-07-19 NOTE — Assessment & Plan Note (Signed)
Limited intermittent topical steroid and once weekly antifungal shampoo.'limit apple cider vinegar

## 2020-07-19 NOTE — Assessment & Plan Note (Signed)
  Patient re-educated about  the importance of commitment to a  minimum of 150 minutes of exercise per week as able.  The importance of healthy food choices with portion control discussed, as well as eating regularly and within a 12 hour window most days. The need to choose "clean , green" food 50 to 75% of the time is discussed, as well as to make water the primary drink and set a goal of 64 ounces water daily.    Weight /BMI 07/19/2020 07/07/2020 05/13/2020  WEIGHT 143 lb 140 lb 146 lb 12.8 oz  HEIGHT 5\' 2"  5\' 2"  5\' 2"   BMI 26.16 kg/m2 25.61 kg/m2 26.85 kg/m2

## 2020-07-19 NOTE — Assessment & Plan Note (Signed)
DASH diet and commitment to daily physical activity for a minimum of 30 minutes discussed and encouraged, as a part of hypertension management. The importance of attaining a healthy weight is also discussed.  BP/Weight 07/19/2020 07/07/2020 05/13/2020 04/28/2020 04/05/2020 02/12/2020 15/07/6977  Systolic BP 480 165 - 537 - 482 707  Diastolic BP 90 66 - 91 - 70 85  Wt. (Lbs) 143 140 146.8 145 146 144.12 142  BMI 26.16 25.61 26.85 26.52 26.7 26.36 25.97

## 2020-07-19 NOTE — Progress Notes (Signed)
   [name]     MRN: [id number]      DOB: 1950/05/03   HPI [name] is here for follow up and re-evaluation of chronic medical conditions, medication management and review of any available recent lab and radiology data.  Preventive health is updated, specifically  Cancer screening and Immunization.   Itchy rash in front of scalp over forehead, has seen 2 dermatolgists ROS Denies recent fever or chills. Denies sinus pressure, nasal congestion, ear pain or sore throat. Denies chest congestion, productive cough or wheezing. Denies chest pains, palpitations and leg swelling Denies abdominal pain, nausea, vomiting,diarrhea or constipation.   Denies dysuria, frequency, hesitancy or incontinence. Denies joint pain, swelling and limitation in mobility. Denies headaches, seizures, numbness, or tingling. Denies depression, anxiety or insomnia.  PE  BP (!) 142/90   Pulse 66   Temp (!) 97.4 F (36.3 C) (Temporal)   Ht 5\' 2"  (1.575 m)   Wt 143 lb (64.9 kg)   SpO2 98%   BMI 26.16 kg/m   Patient alert and oriented and in no cardiopulmonary distress.  HEENT: No facial asymmetry, EOMI,     Neck supple .  Chest: Clear to auscultation bilaterally.  CVS: S1, S2 no murmurs, no S3.Regular rate.  ABD: Soft non tender.   Ext: No edema  MS: Adequate ROM spine, shoulders, hips and knees.  Skin: Intact, mild erythema and papular rash covering approx 3 in diameter area in front of scalp.  Psych: Good eye contact, normal affect. Memory intact not anxious or depressed appearing.  CNS: CN 2-12 intact, power,  normal throughout.no focal deficits noted.   Assessment & Plan  Seborrheic dermatitis of scalp Limited intermittent topical steroid and once weekly antifungal shampoo.'limit apple cider vinegar  Elevated BP without diagnosis of hypertension DASH diet and commitment to daily physical activity for a minimum of 30 minutes discussed and encouraged, as a part of hypertension  management. The importance of attaining a healthy weight is also discussed.  BP/Weight 07/19/2020 07/07/2020 05/13/2020 04/28/2020 04/05/2020 02/12/2020 40/98/1191  Systolic BP 478 295 - 621 - 308 657  Diastolic BP 90 66 - 91 - 70 85  Wt. (Lbs) 143 140 146.8 145 146 144.12 142  BMI 26.16 25.61 26.85 26.52 26.7 26.36 25.97       Overweight (BMI 25.0-29.9)  Patient re-educated about  the importance of commitment to a  minimum of 150 minutes of exercise per week as able.  The importance of healthy food choices with portion control discussed, as well as eating regularly and within a 12 hour window most days. The need to choose "clean , green" food 50 to 75% of the time is discussed, as well as to make water the primary drink and set a goal of 64 ounces water daily.    Weight /BMI 07/19/2020 07/07/2020 05/13/2020  WEIGHT 143 lb 140 lb 146 lb 12.8 oz  HEIGHT 5\' 2"  5\' 2"  5\' 2"   BMI 26.16 kg/m2 25.61 kg/m2 26.85 kg/m2

## 2020-07-19 NOTE — Patient Instructions (Addendum)
Annual physical exam in office with MD, July 8 or after, call if you need me soooner, re evaluate bP  New for rash on scalp is lotion preescribed, use as directed  Shampoo hair at  Most once weekly with the nizoral or T gle shampoo  You are treated for seborrheic dermatitis   Please work on weight loss, exercise and lots of natural foods, limit salt, your bp is elevated today   Seborrheic Dermatitis, Adult Seborrheic dermatitis is a skin disease that causes red, scaly patches. It usually occurs on the scalp, and it is often called dandruff. The patches may appear on other parts of the body. Skin patches tend to appear where there are many oil glands in the skin. Areas of the body that are commonly affected include the:  Scalp.  Ears.  Eyebrows.  Face.  Bearded area of [employer].  Skin folds of the body, such as the armpits, groin, and buttocks.  Chest. The condition may come and go for no known reason, and it is often long-lasting (chronic). What are the causes? The cause of this condition is not known. What increases the risk? The following factors may make you more likely to develop this condition:  Having certain conditions, such as: ? HIV (human immunodeficiency virus). ? AIDS (acquired immunodeficiency syndrome). ? Parkinson's disease. ? Mood disorders, such as depression.  Being 37-68 years old. What are the signs or symptoms? Symptoms of this condition include:  Thick scales on the scalp.  Redness on the face or in the armpits.  Skin that is flaky. The flakes may be white or yellow.  Skin that seems oily or dry but is not helped with moisturizers.  Itching or burning in the affected areas.   How is this diagnosed? This condition is diagnosed with a medical history and physical exam. A sample of your skin may be tested (skin biopsy). You may need to see a skin specialist (dermatologist). How is this treated? There is no cure for this condition, but  treatment can help to manage the symptoms. You may get treatment to remove scales, lower the risk of skin infection, and reduce swelling or itching. Treatment may include:  Creams that reduce skin yeast.  Medicated shampoo.  Moisturizing creams or ointments.  Creams that reduce swelling and irritation (steroids). Follow these instructions at home:  Apply over-the-counter and prescription medicines only as told by your health care provider.  Use any medicated shampoo, skin creams, or ointments only as told by your health care provider.  Keep all follow-up visits as told by your health care provider. This is important. Contact a health care provider if:  Your symptoms do not improve with treatment.  Your symptoms get worse.  You have new symptoms. Get help right away if:  Your condition rapidly worsens with treatment. Summary  Seborrheic dermatitis is a skin disease that causes red, scaly patches.  Seborrheic dermatitis commonly affects the scalp, face, and skin folds.  There is no cure for this condition, but treatment can help to manage the symptoms. This information is not intended to replace advice given to you by your health care provider. Make sure you discuss any questions you have with your health care provider. Document Revised: 12/19/2018 Document Reviewed: 12/19/2018 Elsevier Patient Education  [place].

## 2020-07-20 DIAGNOSIS — N1831 Chronic kidney disease, stage 3a: Secondary | ICD-10-CM | POA: Diagnosis not present

## 2020-07-20 DIAGNOSIS — I129 Hypertensive chronic kidney disease with stage 1 through stage 4 chronic kidney disease, or unspecified chronic kidney disease: Secondary | ICD-10-CM | POA: Diagnosis not present

## 2020-07-20 DIAGNOSIS — M3214 Glomerular disease in systemic lupus erythematosus: Secondary | ICD-10-CM | POA: Diagnosis not present

## 2020-07-28 DIAGNOSIS — R030 Elevated blood-pressure reading, without diagnosis of hypertension: Secondary | ICD-10-CM | POA: Diagnosis not present

## 2020-07-28 DIAGNOSIS — M3214 Glomerular disease in systemic lupus erythematosus: Secondary | ICD-10-CM | POA: Diagnosis not present

## 2020-07-28 DIAGNOSIS — N1831 Chronic kidney disease, stage 3a: Secondary | ICD-10-CM | POA: Diagnosis not present

## 2020-08-16 ENCOUNTER — Emergency Department (HOSPITAL_COMMUNITY): Payer: Medicare PPO

## 2020-08-16 ENCOUNTER — Other Ambulatory Visit: Payer: Self-pay

## 2020-08-16 ENCOUNTER — Emergency Department (HOSPITAL_COMMUNITY)
Admission: EM | Admit: 2020-08-16 | Discharge: 2020-08-16 | Disposition: A | Discharge status: Home / Self Care | Payer: Medicare PPO | Attending: Emergency Medicine | Admitting: Emergency Medicine

## 2020-08-16 ENCOUNTER — Telehealth: Payer: Self-pay

## 2020-08-16 ENCOUNTER — Encounter (HOSPITAL_COMMUNITY): Payer: Self-pay | Admitting: *Deleted

## 2020-08-16 DIAGNOSIS — M79604 Pain in right leg: Secondary | ICD-10-CM | POA: Insufficient documentation

## 2020-08-16 DIAGNOSIS — R600 Localized edema: Secondary | ICD-10-CM | POA: Insufficient documentation

## 2020-08-16 DIAGNOSIS — R609 Edema, unspecified: Secondary | ICD-10-CM

## 2020-08-16 DIAGNOSIS — M79605 Pain in left leg: Secondary | ICD-10-CM | POA: Insufficient documentation

## 2020-08-16 DIAGNOSIS — M7122 Synovial cyst of popliteal space [Baker], left knee: Secondary | ICD-10-CM | POA: Diagnosis not present

## 2020-08-16 DIAGNOSIS — M7989 Other specified soft tissue disorders: Secondary | ICD-10-CM | POA: Diagnosis not present

## 2020-08-16 NOTE — ED Triage Notes (Signed)
Left leg pain x 3 weeks

## 2020-08-16 NOTE — ED Provider Notes (Signed)
[name]   CSN: [id number] Arrival date & time: 08/16/20  1528     History Chief Complaint  Patient presents with  . Leg Pain    [name] is a 70 y.o. female with PMH of SLE, arthritis, osteoporosis, and recent colonoscopy performed 07/07/2020 who presents the ED sent from rheumatologist, Dr. Estanislado Pandy, for DVT rule out.    I reviewed patient's medical record and she was last seen by her rheumatologist on 04/28/2020 and has documented history of arthralgias.  She then saw her primary care provider as recently as 07/19/2020 for itchy scalp, diagnosed with seborrheic dermatitis.  On my examination, patient reports that for the past few weeks she has been experiencing intermittent swelling involving her left knee, left calf, and left ankle.  She also states that she will periodically experience right ankle swelling.  She walks 1 hour/day and the symptoms have not deterred her from her typical routine.  She states that her symptoms are worse in the middle of the day and towards the end of the day.  When she elevates her legs and sleeps in her recliner, she typically wakes up feeling improved.  She has been taking over-the-counter pain medications, with good relief.  She states that her primary care provider and rheumatologist sent her here for DVT rule out given her atraumatic swelling.  She denies any fevers or chills, chest pain or shortness of breath, cough or hemoptysis, pleuritic symptoms, history of clots or clotting disorder, numbness or weakness, diminished range of motion, overlying redness, precipitating injury or trauma, history of IVDA, or other symptoms.  She has never worn compression stockings or been told that she has venous insufficiency/dependent edema.  HPI     Past Medical History:  Diagnosis Date  . Arthritis 02/13/10   abnormal MRI lumbar and c spine  with disc disease  . Osteoporosis   . SLE (systemic lupus erythematosus)  ([place]) dx apprx 1990   reports immunotherapy, cytoxan and steroids at [facility], no f/u for over 65yrs    Patient Active Problem List   Diagnosis Date Noted  . Seborrheic dermatitis of scalp 07/19/2020  . Elevated BP without diagnosis of hypertension 07/19/2020  . Overweight (BMI 25.0-29.9) 07/19/2020  . Systemic lupus erythematosus arthritis ([place]) 02/12/2020  . Allergic sinusitis 11/04/2018  . Hyperlipidemia 03/08/2017  . Proteinuria 03/08/2017  . SLE glomerulonephritis syndrome ( [place]) 09/15/2016  . High risk medication use 09/15/2016  . Degeneration of lumbar intervertebral disc 09/15/2016  . Personal history of other endocrine, nutritional and metabolic disease 04/04/3233  . History of anemia 09/15/2016  . History of proteinuria  09/15/2016  . Dermatitis of face 09/15/2016  . Tubular adenoma of colon 04/30/2016  . Prediabetes 11/15/2014  . Osteopenia 07/21/2013  . Anemia 12/22/2012  . Systemic lupus erythematosus ([place]) 02/15/2012  . H/O: gastrointestinal disease 10/01/2007    Past Surgical History:  Procedure Laterality Date  . ABDOMINAL HYSTERECTOMY  unsure, over 10 yrs   partial, has had abnormal pap  . BLADDER REPAIR    . COLONOSCOPY N/A 01/05/2014   Procedure: COLONOSCOPY;  Surgeon: Daneil Dolin, MD;  Location: [facility];  Service: Endoscopy;  Laterality: N/A;  12:15 PM  . COLONOSCOPY N/A 07/07/2020   Procedure: COLONOSCOPY;  Surgeon: Daneil Dolin, MD;  Location: [facility];  Service: Endoscopy;  Laterality: N/A;  ASA II / AM procedure     OB History   No obstetric history on file.  Family History  Problem Relation Age of Onset  . Heart disease Father   . Cancer Brother 35       breast  . Breast cancer Brother   . Cancer Sister 33       breast  . Breast cancer Sister   . Colon cancer Neg Hx     Social History   Tobacco Use  . Smoking status: Never Smoker  . Smokeless tobacco: Never Used  Vaping Use  . Vaping Use: Never used  Substance Use  Topics  . Alcohol use: No  . Drug use: No    Home Medications Prior to Admission medications   Medication Sig Start Date End Date Taking? Authorizing Provider  alendronate (FOSAMAX) 70 MG tablet TAKE ONE TABLET (70MG  TOTAL) BY MOUTH EVERY 7 DAYS. TAKE WITH A FULL GLASS OF WATER ON AN EMPTY STOMACH. Patient taking differently: Take 70 mg by mouth every Monday. TAKE WITH A FULL GLASS OF WATER ON AN EMPTY STOMACH. 04/05/20   [name], MD  Calcium Carb-Cholecalciferol (CALCIUM 600+D) 600-800 MG-UNIT TABS Take 2 tablets by mouth daily.    [provider]  cholecalciferol (VITAMIN D3) 25 MCG (1000 UNIT) tablet Take 2,000 Units by mouth daily.    [provider]  clobetasol (TEMOVATE) 0.05 % external solution Apply sparingly once to two times weekly to affected area on scalp , for excess itching 07/19/20   [name], MD  Cyanocobalamin (B-12 PO) Take 1 tablet by mouth daily.    [provider]  Garlic 123XX123 MG CAPS Take 1,000 mg by mouth daily.    [provider]  hydroxychloroquine (PLAQUENIL) 200 MG tablet TAKE ONE TABLET BY MOUTH TWICE A DAY MONDAY THROUGH FRIDAY. Patient taking differently: Take 400 mg by mouth every Monday, Tuesday, Wednesday, Thursday, and Friday. 05/24/20   Ofilia Neas, PA-C  ipratropium (ATROVENT) 0.06 % nasal spray Place 2 sprays into both nostrils 3 (three) times daily. Patient taking differently: Place 2 sprays into both nostrils as needed for rhinitis. 04/05/20   [name], MD  Multiple Vitamins-Minerals (SYSTANE ICAPS AREDS2) CAPS Take 2 capsules by mouth daily.    [provider]  VITAMIN A PO Take 1 capsule by mouth daily.    [provider]  vitamin C (ASCORBIC ACID) 500 MG tablet Take 1,000 mg by mouth daily.    [provider]    Allergies    Sulfa antibiotics and Sulfonamide derivatives  Review of Systems   Review of Systems  All other systems reviewed and are  negative.   Physical Exam Updated Vital Signs BP (!) 147/94 (BP Location: Right Arm)   Pulse 67   Temp 98.3 F (36.8 C) (Oral)   Resp 16   SpO2 97%   Physical Exam Vitals and nursing Wilson reviewed. Exam conducted with a chaperone present.  Constitutional:      Appearance: Normal appearance.  HENT:     Head: Normocephalic and atraumatic.  Eyes:     General: No scleral icterus.    Conjunctiva/sclera: Conjunctivae normal.  Cardiovascular:     Rate and Rhythm: Normal rate.     Pulses: Normal pulses.  Pulmonary:     Effort: Pulmonary effort is normal. No respiratory distress.  Musculoskeletal:     Cervical back: Normal range of motion.     Right lower leg: No edema.     Left lower leg: Edema present.     Comments: Left leg: No appreciable swelling, erythema,  or other overlying skin changes.  No TTP involving the left knee or calf.  She does have mild ankle nonpitting edema relative to contralateral leg.  AROM is fully intact.  Pedal pulse intact and symmetric with contralateral foot.  Sensation intact throughout.  Compartments are soft.  No evidence of traumas.  No areas of induration/warmth.  Skin:    General: Skin is dry.  Neurological:     Mental Status: She is alert.     GCS: GCS eye subscore is 4. GCS verbal subscore is 5. GCS motor subscore is 6.  Psychiatric:        Mood and Affect: Mood normal.        Behavior: Behavior normal.        Thought Content: Thought content normal.     ED Results / Procedures / Treatments   Labs (all labs ordered are listed, but only abnormal results are displayed) Labs Reviewed - No data to display  EKG None  Radiology US Venous Img Lower Bilateral (DVT)  Result Date: 08/16/2020 CLINICAL DATA:  Leg pain with right ankle swelling. EXAM: BILATERAL LOWER EXTREMITY VENOUS DOPPLER ULTRASOUND TECHNIQUE: Gray-scale sonography with graded compression, as well as color Doppler and duplex ultrasound were performed to evaluate the lower  extremity deep venous systems from the level of the common femoral vein and including the common femoral, femoral, profunda femoral, popliteal and calf veins including the posterior tibial, peroneal and gastrocnemius veins when visible. The superficial great saphenous vein was also interrogated. Spectral Doppler was utilized to evaluate flow at rest and with distal augmentation maneuvers in the common femoral, femoral and popliteal veins. COMPARISON:  None. FINDINGS: RIGHT LOWER EXTREMITY Common Femoral Vein: No evidence of thrombus. Normal compressibility, respiratory phasicity and response to augmentation. Saphenofemoral Junction: No evidence of thrombus. Normal compressibility and flow on color Doppler imaging. Profunda Femoral Vein: No evidence of thrombus. Normal compressibility and flow on color Doppler imaging. Femoral Vein: No evidence of thrombus. Normal compressibility, respiratory phasicity and response to augmentation. Popliteal Vein: No evidence of thrombus. Normal compressibility, respiratory phasicity and response to augmentation. Calf Veins: No evidence of thrombus. Normal compressibility and flow on color Doppler imaging. LEFT LOWER EXTREMITY Common Femoral Vein: No evidence of thrombus. Normal compressibility, respiratory phasicity and response to augmentation. Saphenofemoral Junction: No evidence of thrombus. Normal compressibility and flow on color Doppler imaging. Profunda Femoral Vein: No evidence of thrombus. Normal compressibility and flow on color Doppler imaging. Femoral Vein: No evidence of thrombus. Normal compressibility, respiratory phasicity and response to augmentation. Popliteal Vein: No evidence of thrombus. Normal compressibility, respiratory phasicity and response to augmentation. Calf Veins: No evidence of thrombus. Normal compressibility and flow on color Doppler imaging. Other Findings: Complex cyst within the left popliteal fossa which measures 4.1 x 1.7 x 1.4 cm. This lesion  has an internal septation and internal complex debris. No flow with Doppler imaging. IMPRESSION: 1. No evidence of deep venous thrombosis in either lower extremity. 2. Complex 4.1 cm cyst within the left popliteal fossa, which is nonspecific but most likely a Baker's cyst. Electronically Signed   By: Margaretha Sheffield MD   On: 08/16/2020 17:30    Procedures Procedures   Medications Ordered in ED Medications - No data to display  ED Course  I have reviewed the triage vital signs and the nursing notes.  Pertinent labs & imaging results that were available during my care of the patient were reviewed by me and considered in my medical decision making (see  chart for details).    MDM Rules/Calculators/A&P                          GENIENE LIST was evaluated in Emergency Department on 08/16/2020 for the symptoms described in the history of present illness. She was evaluated in the context of the global COVID-19 pandemic, which necessitated consideration that the patient might be at risk for infection with the SARS-CoV-2 virus that causes COVID-19. Institutional protocols and algorithms that pertain to the evaluation of patients at risk for COVID-19 are in a state of rapid change based on information released by regulatory bodies including the CDC and federal and state organizations. These policies and algorithms were followed during the patient's care in the ED.  I personally reviewed patient's medical chart and all notes from triage and staff during today's encounter. I have also ordered and reviewed all labs and imaging that I felt to be medically necessary in the evaluation of this patient's complaints and with consideration of their physical exam. If needed, translation services were available and utilized.   Patient's physical exam is benign.  No appreciable swelling aside from left ankle which is only mild.  Doubt effusion.  AROM fully intact.  She is functionally and neurovascularly intact.   There are no cellulitic changes.  She denies any fevers or history of IVDA.  Compartments are soft and there is no precipitating trauma.  Bilateral DVT study is without any evidence of DVT.  There is a complex 4.1 cm cyst within the left popliteal fossa, likely Baker's cyst.  Her history is suggestive of dependent edema in context of venous insufficiency.  I am recommending that she obtain compression stockings and keep the legs elevated.  Given that she has multijoint involvement, we will also recommend that she follow-up with her rheumatologist regarding today's ED encounter and reassuring work-up/imaging.  ER return precautions discussed.  Patient voices understanding and is agreeable to plan.  Final Clinical Impression(s) / ED Diagnoses Final diagnoses:  Leg pain, bilateral    Rx / DC Orders ED Discharge Orders    None       Corena Herter, PA-C 08/16/20 1832    Truddie Hidden, MD 08/16/20 2342

## 2020-08-16 NOTE — Discharge Instructions (Signed)
Your DVT study here in the ED was negative.  There is no evidence of blood clots in either of your legs.  You did have a 4 cm Baker's cyst found incidentally.  This is not likely contributing to your symptoms.  Instead, your history and physical exam is suggestive of dependent edema, likely in context of early venous insufficiency.  I have prescribed you compression stockings, but please consult with your primary care provider or pharmacist for what they would recommend in context of your newly diagnosed dependent edema/venous insufficiency.  Please follow-up with your primary care provider as well as your rheumatologist regarding today's ED encounter and for ongoing evaluation and management.  Keep your legs elevated whenever possible to mitigate swelling and help facilitate blood flow return to the heart.  This will help with your symptoms of discomfort.  Return to the ER seek immediate medical attention should experience any new or worsening symptoms.

## 2020-08-16 NOTE — Telephone Encounter (Signed)
Patient called she has some swelling of legs and pain. Does he need to be seen by Rheumatologist for this? Please call patient # [phone].

## 2020-08-16 NOTE — Telephone Encounter (Signed)
Patient left a voicemail stating she has swelling in her knee, leg and ankle and requested to speak with Dr. Estanislado Pandy or her nurse before scheduling an appointment.

## 2020-08-16 NOTE — Telephone Encounter (Signed)
I called patient, Dr. Estanislado Pandy wants patient to go to the emergency room.

## 2020-08-16 NOTE — Telephone Encounter (Signed)
If patient sees rheumatology she can make an appointment there or make an appointment here to follow up her choice.

## 2020-08-16 NOTE — ED Triage Notes (Signed)
Also has some swelling in the right ankle, referred here for ultrasound of left leg

## 2020-08-26 ENCOUNTER — Ambulatory Visit: Payer: Medicare PPO | Admitting: Family Medicine

## 2020-08-30 ENCOUNTER — Other Ambulatory Visit: Payer: Self-pay | Admitting: Physician Assistant

## 2020-08-30 DIAGNOSIS — M3219 Other organ or system involvement in systemic lupus erythematosus: Secondary | ICD-10-CM

## 2020-08-30 NOTE — Telephone Encounter (Signed)
Last Visit: 04/28/2020 Next Visit: 09/29/2020 Labs:April 07, 2020 labs done by Dr. Theador Hawthorne showed CBC WBC 3.4, CMP creatinine 1.29, GFR 43, ANA 1: 80, positive RNP, positive Smith, double-stranded DNA 5, C3-C4 normal, UA showed trace esterase. Eye exam: 04/26/2020  Current Dose per office note 04/28/2020,  PLQ 200 mg p.o. twice daily Monday through Friday. XG:XIVHS systemic lupus erythematosus with other organ involvement   Last Fill: 05/24/2020  Okay to refill PLQ?

## 2020-09-15 NOTE — Progress Notes (Signed)
Office Visit Note  Patient: [name]             Date of Birth: [birth year]           MRN: [id number]             PCP: [name], MD Referring: [name], MD Visit Date: 09/29/2020 Occupation: @GUAROCC @  Subjective:  Medication monitoring   History of Present Illness: [name] is a 70 y.o. female with history of systemic lupus erythematosus and lupus nephritis.  Patient is taking Plaquenil 200 mg 1 tablet by mouth twice daily Monday through Friday.  She continues to tolerate Plaquenil without any side effects and has not missed any doses recently.  She denies any signs or symptoms of a systemic lupus flare recently.  She has not had any oral or nasal ulcerations.  She denies any recent rashes and tries to avoid direct sun exposure.  She has occasional mouth dryness and uses Biotene products as needed.  She denies any swollen lymph nodes or increased fatigue.  She has not had any shortness of breath, pleuritic chest pain, or palpitations.  She denies any increased joint pain or joint swelling at this time.  According to the patient about 2 months ago she started to experience tightness in her left knee.  She was diagnosed with a Baker's cyst after having an ultrasound to rule out a blood clot.  The tenderness and soreness in her left calf has completely resolved.  She denies any recent falls.  She remains on Fosamax 70 mg 1 tablet by mouth once weekly for management of osteopenia.  She is also taking a calcium and vitamin D supplement daily.     Activities of Daily Living:  Patient reports morning stiffness for 0  none .   Patient Denies nocturnal pain.  Difficulty dressing/grooming: Denies Difficulty climbing stairs: Denies Difficulty getting out of chair: Denies Difficulty using hands for taps, buttons, cutlery, and/or writing: Denies  Review of Systems  Constitutional:  Negative for fatigue.  HENT:  Negative for mouth dryness.   Eyes:  Positive for  dryness.  Respiratory:  Negative for shortness of breath.   Cardiovascular:  Negative for swelling in legs/feet.  Gastrointestinal:  Positive for constipation.  Endocrine: Negative for excessive thirst.  Genitourinary:  Negative for difficulty urinating.  Musculoskeletal:  Negative for muscle tenderness.  Skin:  Negative for rash.  Allergic/Immunologic: Negative for susceptible to infections.  Neurological:  Negative for numbness.  Hematological:  Negative for bruising/bleeding tendency.  Psychiatric/Behavioral:  Negative for sleep disturbance.    PMFS History:  Patient Active Problem List   Diagnosis Date Noted   Seborrheic dermatitis of scalp 07/19/2020   Elevated BP without diagnosis of hypertension 07/19/2020   Overweight (BMI 25.0-29.9) 07/19/2020   Systemic lupus erythematosus arthritis ([place]) 02/12/2020   Allergic sinusitis 11/04/2018   Hyperlipidemia 03/08/2017   Proteinuria 03/08/2017   SLE glomerulonephritis syndrome ([place]) 09/15/2016   High risk medication use 09/15/2016   Degeneration of lumbar intervertebral disc 09/15/2016   Personal history of other endocrine, nutritional and metabolic disease 84/16/6063   History of anemia 09/15/2016   History of proteinuria  09/15/2016   Dermatitis of face 09/15/2016   Tubular adenoma of colon 04/30/2016   Prediabetes 11/15/2014   Osteopenia 07/21/2013   Anemia 12/22/2012   Systemic lupus erythematosus ([place]) 02/15/2012   H/O: gastrointestinal disease 10/01/2007    Past Medical History:  Diagnosis Date   Arthritis 02/13/10  abnormal MRI lumbar and c spine  with disc disease   Osteoporosis    SLE (systemic lupus erythematosus) ([facility]) dx apprx 1990   reports immunotherapy, cytoxan and steroids at [facility], no f/u for over 44yr    Family History  Problem Relation Age of Onset   Heart disease Father    Cancer Brother 620      breast   Breast cancer Brother    Cancer Sister 517      breast   Breast cancer Sister    Colon  cancer Neg Hx    Past Surgical History:  Procedure Laterality Date   ABDOMINAL HYSTERECTOMY  unsure, over 10 yrs   partial, has had abnormal pap   BLADDER REPAIR     COLONOSCOPY N/A 01/05/2014   Procedure: COLONOSCOPY;  Surgeon: RDaneil Dolin MD;  [facility];  Service: Endoscopy;  Laterality: N/A;  12:15 PM   COLONOSCOPY N/A 07/07/2020   Procedure: COLONOSCOPY;  Surgeon: RDaneil Dolin MD;  Location[facility];  Service: Endoscopy;  Laterality: N/A;  ASA II / AM procedure   Social History   Social History Narrative   Not on file   Immunization History  Administered Date(s) Administered   Fluad Quad(high Dose 65+) 11/18/2018, 01/05/2020   Influenza Split 02/14/2012   Influenza Whole 12/28/2000   Influenza,inj,Quad PF,6+ Mos 12/16/2012, 01/06/2014, 01/19/2016, 12/10/2017   Moderna Sars-Covid-2 Vaccination 05/02/2019, 05/31/2019, 01/29/2020   Pneumococcal Conjugate-13 09/25/2017   Pneumococcal Polysaccharide-23 09/30/2018   Td 09/17/2003   Tdap 01/06/2014   Zoster, Live 12/17/2012     Objective: Vital Signs: BP 121/70 (BP Location: Right Arm, Patient Position: Sitting, Cuff Size: Normal)   Pulse 79   Resp 15   Ht 5' 2"  (1.575 m)   Wt 144 lb (65.3 kg)   BMI 26.34 kg/m    Physical Exam Vitals and nursing note reviewed.  Constitutional:      Appearance: She is well-developed.  HENT:     Head: Normocephalic and atraumatic.     Mouth/Throat:     Comments: No parotid swelling or tenderness Eyes:     Conjunctiva/sclera: Conjunctivae normal.  Cardiovascular:     Rate and Rhythm: Normal rate and regular rhythm.  Pulmonary:     Effort: Pulmonary effort is normal.     Breath sounds: Normal breath sounds.  Abdominal:     Palpations: Abdomen is soft.  Musculoskeletal:     Cervical back: Normal range of motion.  Lymphadenopathy:     Cervical: No cervical adenopathy.  Skin:    General: Skin is warm and dry.     Capillary Refill: Capillary refill takes  less than 2 seconds.     Comments: No digital ulcerations or signs of gangrene  Neurological:     Mental Status: She is alert and oriented to person, place, and time.  Psychiatric:        Behavior: Behavior normal.     Musculoskeletal Exam: C-spine, thoracic spine, and lumbar spine good ROM with no discomfort.  Shoulder joints, elbow joints, wrist joints, MCPs, PIPs, and DIPs good ROM with no synovitis.  Complete fist formation bilaterally.  Hip joints, knee joints, and ankle joints good ROM with no discomfort.  No warmth or effusion of knee joints.  No tenderness or swelling of ankle joints.    CDAI Exam: CDAI Score: -- Patient Global: --; Provider Global: -- Swollen: --; Tender: -- Joint Exam 09/29/2020   No joint exam has been documented  for this visit   There is currently no information documented on the homunculus. Go to the Rheumatology activity and complete the homunculus joint exam.  Investigation: No additional findings.  Imaging: No results found.  Recent Labs: Lab Results  Component Value Date   WBC 3.5 (L) 11/28/2019   HGB 12.2 11/28/2019   PLT 160 11/28/2019   NA 138 11/28/2019   K 4.5 11/28/2019   CL 103 11/28/2019   CO2 28 11/28/2019   GLUCOSE 82 11/28/2019   BUN 16 11/28/2019   CREATININE 1.35 (H) 11/28/2019   BILITOT 0.5 11/28/2019   ALKPHOS 43 09/18/2016   AST 29 11/28/2019   ALT 20 11/28/2019   PROT 7.2 11/28/2019   ALBUMIN 3.8 09/18/2016   CALCIUM 9.8 11/28/2019   GFRAA 47 (L) 11/28/2019    Speciality Comments: Plaquenil eye exam normal on 04/26/2020 NORMAL 6 months  per my eye doctor in [facility]  Procedures:  No procedures performed Allergies: Sulfa antibiotics and Sulfonamide derivatives     Assessment / Plan:     Visit Diagnoses: Other systemic lupus erythematosus with other organ involvement ([place]) - History of dsDNA +64, Smith + 5.5, RNP+ 6.5, SSA([place]) Negative, SSB ([place]) Negative, presented initially with nephritis, hair loss,  arthralgias, dermatitis: She has not had any signs or symptoms of a systemic lupus flare.  She is clinically doing well taking Plaquenil 200 mg 1 tablet by mouth twice daily Monday through Friday.  She continues to tolerate Plaquenil without any side effects and has not missed any doses recently.  She is not experiencing any arthralgias and has no synovitis on examination today.  She has not had any recent rashes and has been trying to avoid direct sun exposure.  We discussed the importance of wearing SPF greater than 50 on a daily basis and to wear a widebrimmed hat while outdoors.  She has not noticed any increased hair loss.  No signs of alopecia were noted at this time.  She has not had any oral or nasal ulcerations.  She has occasional mouth dryness and uses Biotene products for symptomatic relief.  She has no parotid swelling or tenderness on examination.  No cervical lymphadenopathy was palpable today.  She has not had any shortness of breath, pleuritic chest pain, or palpitations.  Her lungs were clear to auscultation today. Lab work from 11/28/2019 was reviewed today in the office: ANA 1: 80NH, 1: 40 cytoplasmic, ESR within normal limits, complements within normal limits, and double-stranded DNA 7.   She had updated labs on 07/20/2020: White blood cell count 4.3, hemoglobin 12.1, platelets 169, creatinine 1.40, GFR 45, ESR 17, ANA negative, C3 123, C4 24, and no proteinuria noted.  According to the patient she had updated lab work drawn yesterday so we will call to obtain these records to review.  She continues to follow-up with Dr. Theador Hawthorne.   She will remain on Plaquenil as prescribed.  She was advised to notify us if she develops signs or symptoms of a flare.  She will follow-up in the office in 5 months.  Lupus nephritis ([place]) 1999-2000 - treated with Cytoxan IV and prednisone for 1 year.  She follows up with her nephrologist every year (Dr. Theador Hawthorne).  She had updated lab work yesterday so we will call  to obtain these records.  No proteinuria was noted on 04/07/2020.  High risk medication use -Plaquenil 200 mg 1 tablet by mouth twice daily Monday through Friday. Plaquenil eye exam normal on 04/26/2020  NORMAL 6 months per my eye doctor in [facility].  She had lab work drawn yesterday so we will call to obtain these records.  Lab results from 07/20/2020 were reviewed today while the patient was in the office.  White blood cell count was 4.3, hemoglobin was 12.1, platelet count 169, creatinine 1.40, and GFR 45.  Dermatitis: Resolved.   History of renal insufficiency - Followed by Dr. Theador Hawthorne.  According to the patient she had lab work drawn yesterday so we will call to obtain these records.  DDD (degenerative disc disease), lumbar: She is not experiencing any increased lower back pain or stiffness at this time.  Age-related osteoporosis without current pathological fracture -DEXA 01/19/2020: The BMD measured at Forearm Radius 33% is 0.549 g/cm2 with a T-score of -2.3.  Managed by Dr. Moshe Cipro.  She is taking Fosamax 70 mg 1 tablet by mouth once weekly started in October 2019.   She continues to take a calcium and vitamin D supplement on a daily basis as recommended.  She has not had any recent falls or fractures.  Other medical conditions are listed as follows:   History of hyperlipidemia  History of proteinuria syndrome: Followed by Theador Hawthorne.    History of diverticulosis  History of adenomatous polyp of colon  History of prediabetes  History of anemia  Orders: No orders of the defined types were placed in this encounter.  No orders of the defined types were placed in this encounter.   Follow-Up Instructions: Return in about 5 months (around 03/01/2021) for Systemic lupus erythematosus.   Ofilia Neas, PA-C  Note - This record has been created using Dragon software.  Chart creation errors have been sought, but may not always  have been located. Such creation errors do not reflect  on  the standard of medical care.

## 2020-09-24 DIAGNOSIS — M3214 Glomerular disease in systemic lupus erythematosus: Secondary | ICD-10-CM | POA: Diagnosis not present

## 2020-09-24 DIAGNOSIS — N1831 Chronic kidney disease, stage 3a: Secondary | ICD-10-CM | POA: Diagnosis not present

## 2020-09-24 DIAGNOSIS — R030 Elevated blood-pressure reading, without diagnosis of hypertension: Secondary | ICD-10-CM | POA: Diagnosis not present

## 2020-09-29 ENCOUNTER — Ambulatory Visit: Payer: Medicare PPO | Admitting: Physician Assistant

## 2020-09-29 ENCOUNTER — Encounter: Payer: Self-pay | Admitting: Physician Assistant

## 2020-09-29 ENCOUNTER — Other Ambulatory Visit: Payer: Self-pay

## 2020-09-29 VITALS — BP 121/70 | HR 79 | Resp 15 | Ht 62.0 in | Wt 144.0 lb

## 2020-09-29 DIAGNOSIS — Z8639 Personal history of other endocrine, nutritional and metabolic disease: Secondary | ICD-10-CM

## 2020-09-29 DIAGNOSIS — M810 Age-related osteoporosis without current pathological fracture: Secondary | ICD-10-CM

## 2020-09-29 DIAGNOSIS — Z8719 Personal history of other diseases of the digestive system: Secondary | ICD-10-CM | POA: Diagnosis not present

## 2020-09-29 DIAGNOSIS — Z87898 Personal history of other specified conditions: Secondary | ICD-10-CM

## 2020-09-29 DIAGNOSIS — Z87448 Personal history of other diseases of urinary system: Secondary | ICD-10-CM | POA: Diagnosis not present

## 2020-09-29 DIAGNOSIS — M3219 Other organ or system involvement in systemic lupus erythematosus: Secondary | ICD-10-CM | POA: Diagnosis not present

## 2020-09-29 DIAGNOSIS — M5136 Other intervertebral disc degeneration, lumbar region: Secondary | ICD-10-CM | POA: Diagnosis not present

## 2020-09-29 DIAGNOSIS — Z79899 Other long term (current) drug therapy: Secondary | ICD-10-CM | POA: Diagnosis not present

## 2020-09-29 DIAGNOSIS — Z860101 Personal history of adenomatous and serrated colon polyps: Secondary | ICD-10-CM

## 2020-09-29 DIAGNOSIS — M3214 Glomerular disease in systemic lupus erythematosus: Secondary | ICD-10-CM | POA: Diagnosis not present

## 2020-09-29 DIAGNOSIS — Z862 Personal history of diseases of the blood and blood-forming organs and certain disorders involving the immune mechanism: Secondary | ICD-10-CM

## 2020-09-29 DIAGNOSIS — Z86010 Personal history of colonic polyps: Secondary | ICD-10-CM

## 2020-09-29 DIAGNOSIS — L309 Dermatitis, unspecified: Secondary | ICD-10-CM

## 2020-09-29 DIAGNOSIS — M51369 Other intervertebral disc degeneration, lumbar region without mention of lumbar back pain or lower extremity pain: Secondary | ICD-10-CM

## 2020-10-04 ENCOUNTER — Encounter: Payer: Self-pay | Admitting: Family Medicine

## 2020-10-04 ENCOUNTER — Other Ambulatory Visit: Payer: Self-pay

## 2020-10-04 ENCOUNTER — Ambulatory Visit (INDEPENDENT_AMBULATORY_CARE_PROVIDER_SITE_OTHER): Payer: Medicare PPO | Admitting: Family Medicine

## 2020-10-04 VITALS — BP 131/78 | HR 80 | Resp 16 | Ht 62.0 in | Wt 145.4 lb

## 2020-10-04 DIAGNOSIS — Z0000 Encounter for general adult medical examination without abnormal findings: Secondary | ICD-10-CM

## 2020-10-04 DIAGNOSIS — Z1231 Encounter for screening mammogram for malignant neoplasm of breast: Secondary | ICD-10-CM

## 2020-10-04 NOTE — Assessment & Plan Note (Signed)

## 2020-10-04 NOTE — Patient Instructions (Signed)
F/U in 6 months, call if you need me sooner  Call for flu vaccine end August   Please get covid booster , overdue  Please schedule December mammogram at checkout  When you have your recent labs, please leave a copy at the desk, so I know what to order  GREAT exercise routine, keep eating making healthy food choices  Thanks for choosing [facility], we consider it a privelige to serve you.

## 2020-10-04 NOTE — Progress Notes (Signed)
    [name]     MRN: [id number]      DOB: [birth year]  HPI: Patient is in for annual physical exam. No other health concerns are expressed or addressed at the visit. Recent labs,  are reviewed. Immunization is reviewed , and  updated if needed.   PE: BP 131/78   Pulse 80   Resp 16   Ht 5\' 2"  (1.575 m)   Wt 145 lb 6.4 oz (66 kg)   SpO2 97%   BMI 26.59 kg/m   Pleasant  female, alert and oriented x 3, in no cardio-pulmonary distress. Afebrile. HEENT No facial trauma or asymetry. Sinuses non tender.  Extra occullar muscles intact.. External ears normal, . Neck: supple, no adenopathy,JVD or thyromegaly.No bruits.  Chest: Clear to ascultation bilaterally.No crackles or wheezes. Non tender to palpation  Breast: No asymetry,no masses or lumps. No tenderness. No nipple discharge or inversion. No axillary or supraclavicular adenopathy  Cardiovascular system; Heart sounds normal,  S1 and  S2 ,no S3.  No murmur, or thrill. Apical beat not displaced Peripheral pulses normal.  Abdomen: Soft, non tender, no organomegaly or masses. No bruits. Bowel sounds normal. No guarding, tenderness or rebound.      Musculoskeletal exam: Full ROM of spine, hips , shoulders and knees. No deformity ,swelling or crepitus noted. No muscle wasting or atrophy.   Neurologic: Cranial nerves 2 to 12 intact. Power, tone ,sensation and reflexes normal throughout. No disturbance in gait. No tremor.  Skin: Intact, no ulceration, erythema , scaling or rash noted. Pigmentation normal throughout  Psych; Normal mood and affect. Judgement and concentration normal   Assessment & Plan:  Annual physical exam Annual exam as documented. Counseling done  re healthy lifestyle involving commitment to 150 minutes exercise per week, heart healthy diet, and attaining healthy weight.The importance of adequate sleep also discussed. Regular seat belt use and home safety, is also discussed. Changes  in health habits are decided on by the patient with goals and time frames  set for achieving them. Immunization and cancer screening needs are specifically addressed at this visit.

## 2020-10-07 DIAGNOSIS — M3214 Glomerular disease in systemic lupus erythematosus: Secondary | ICD-10-CM | POA: Diagnosis not present

## 2020-10-07 DIAGNOSIS — N1831 Chronic kidney disease, stage 3a: Secondary | ICD-10-CM | POA: Diagnosis not present

## 2020-10-07 DIAGNOSIS — I129 Hypertensive chronic kidney disease with stage 1 through stage 4 chronic kidney disease, or unspecified chronic kidney disease: Secondary | ICD-10-CM | POA: Diagnosis not present

## 2020-10-18 ENCOUNTER — Other Ambulatory Visit: Payer: Self-pay | Admitting: Family Medicine

## 2020-11-16 DIAGNOSIS — E663 Overweight: Secondary | ICD-10-CM | POA: Diagnosis not present

## 2020-11-16 DIAGNOSIS — M810 Age-related osteoporosis without current pathological fracture: Secondary | ICD-10-CM | POA: Diagnosis not present

## 2020-11-16 DIAGNOSIS — N189 Chronic kidney disease, unspecified: Secondary | ICD-10-CM | POA: Diagnosis not present

## 2020-11-16 DIAGNOSIS — J309 Allergic rhinitis, unspecified: Secondary | ICD-10-CM | POA: Diagnosis not present

## 2020-11-16 DIAGNOSIS — R030 Elevated blood-pressure reading, without diagnosis of hypertension: Secondary | ICD-10-CM | POA: Diagnosis not present

## 2020-11-16 DIAGNOSIS — G8929 Other chronic pain: Secondary | ICD-10-CM | POA: Diagnosis not present

## 2020-11-16 DIAGNOSIS — M329 Systemic lupus erythematosus, unspecified: Secondary | ICD-10-CM | POA: Diagnosis not present

## 2020-11-16 DIAGNOSIS — Z7983 Long term (current) use of bisphosphonates: Secondary | ICD-10-CM | POA: Diagnosis not present

## 2020-11-16 DIAGNOSIS — Z6825 Body mass index (BMI) 25.0-25.9, adult: Secondary | ICD-10-CM | POA: Diagnosis not present

## 2020-11-26 ENCOUNTER — Ambulatory Visit: Payer: Medicare PPO | Admitting: Family Medicine

## 2020-11-26 ENCOUNTER — Other Ambulatory Visit: Payer: Self-pay

## 2020-11-26 ENCOUNTER — Encounter: Payer: Self-pay | Admitting: Family Medicine

## 2020-11-26 VITALS — BP 136/82 | HR 66 | Temp 98.6°F | Resp 16 | Ht 63.0 in | Wt 142.4 lb

## 2020-11-26 DIAGNOSIS — M7122 Synovial cyst of popliteal space [Baker], left knee: Secondary | ICD-10-CM | POA: Insufficient documentation

## 2020-11-26 DIAGNOSIS — Z23 Encounter for immunization: Secondary | ICD-10-CM | POA: Diagnosis not present

## 2020-11-26 NOTE — Progress Notes (Signed)
   [name]     MRN: [id number]      DOB: 11/03/1950   HPI Ms. Buffone is here for follow up of left baker's cyst initially dx in 07/2020. Had flare of pain for 3 days this week, could barely walk, needs ortho eval and management No inciting trauma associated with current flare ROS Denies recent fever or chills. Denies sinus pressure, nasal congestion, ear pain or sore throat. Denies chest congestion, productive cough or wheezing. Denies chest pains, palpitations and leg swelling Denies abdominal pain, nausea, vomiting,diarrhea or constipation.   Denies dysuria, frequency, hesitancy or incontinence.  Denies headaches, seizures, numbness, or tingling. Denies depression, anxiety or insomnia. Denies skin break down or rash.   PE BP 136/82   Pulse 66   Temp 98.6 F (37 C)   Resp 16   Ht '5\' 3"'$  (1.6 m)   Wt 142 lb 6.4 oz (64.6 kg)   SpO2 99%   BMI 25.23 kg/m    Patient alert and oriented and in no cardiopulmonary distress.Pt in pain  HEENT: No facial asymmetry, EOMI,     Neck supple .  Chest: Clear to auscultation bilaterally.  CVS: S1, S2 no murmurs, no S3.Regular rate.  ABD: Soft non tender.   Ext: No edema  MS: Adequate ROM spine, shoulders, hips and reduced in  left knee with tenderness behind left knee Skin: Intact, no ulcerations or rash noted.  Psych: Good eye contact, normal affect. Memory intact not anxious or depressed appearing.  CNS: CN 2-12 intact, power,  normal throughout.no focal deficits noted.   Assessment & Plan  Baker's cyst of knee, left Refer to ortho , sym,ptomatic, acue flare x 3 days

## 2020-11-26 NOTE — Patient Instructions (Addendum)
F/U as before, call if you need me sooner  Please schedule appt for Ortho at checkout asap, thanks  Flu vaccine today  Thanks for choosing [facility], we consider it a privelige to serve you.

## 2020-11-26 NOTE — Assessment & Plan Note (Addendum)
Refer to ortho , sym,ptomatic, acue flare x 3 days

## 2020-12-01 ENCOUNTER — Ambulatory Visit: Payer: Medicare PPO

## 2020-12-01 ENCOUNTER — Encounter: Payer: Self-pay | Admitting: Orthopedic Surgery

## 2020-12-01 ENCOUNTER — Other Ambulatory Visit: Payer: Self-pay

## 2020-12-01 ENCOUNTER — Ambulatory Visit: Payer: Medicare PPO | Admitting: Orthopedic Surgery

## 2020-12-01 VITALS — BP 152/80 | HR 75 | Ht 63.0 in | Wt 140.2 lb

## 2020-12-01 DIAGNOSIS — M1712 Unilateral primary osteoarthritis, left knee: Secondary | ICD-10-CM | POA: Diagnosis not present

## 2020-12-01 DIAGNOSIS — M7122 Synovial cyst of popliteal space [Baker], left knee: Secondary | ICD-10-CM

## 2020-12-01 DIAGNOSIS — G8929 Other chronic pain: Secondary | ICD-10-CM

## 2020-12-01 NOTE — Progress Notes (Signed)
New Patient Visit  Assessment: [name] is a 70 y.o. female with the following: Left knee arthritis, painful left Bakers cyst  Plan: Reviewed radiographs with the patient which demonstrates moderate degenerative changes, including the decreased joint space within the medial compartment.  She does have tenderness on physical exam.  We discussed the possibility of proceeding with a left knee, intra-articular steroid injection as well as the possibility of an image guided aspiration and injection of the Baker's cyst.  We can do the knee injection in clinic today, and she elects to proceed.  She will see if this improves her symptoms overall, and call back if she wished to proceed with the procedure on her Baker's cyst.  Procedure note injection Left knee joint   Verbal consent was obtained to inject the left knee joint  Timeout was completed to confirm the site of injection.  The skin was prepped with alcohol and ethyl chloride was sprayed at the injection site.  A 21-gauge needle was used to inject 40 mg of Depo-Medrol and 1% lidocaine (3 cc) into the left knee using an anterolateral approach.  There were no complications. A sterile bandage was applied.      Follow-up: Return if symptoms worsen or fail to improve.  Subjective:  Chief Complaint  Patient presents with   New Patient (Initial Visit)   Knee Pain    Left//pt states she has a Bakers cyst behind that knee x 2 mths    History of Present Illness: [name] is a 70 y.o. female who has been referred to clinic today by Tula Nakayama, MD for evaluation of left knee pain.  She states she had pain in the left knee for several months.  For the past 2-3 months, she has had significant pain in the posterior aspect of her knee.  She presented to the emergency department where an ultrasound determine the presence of a large Baker's cyst.  Over the last few days, her pain is worsened.  She has been taking some over-the-counter  pain medications, but has a history of lupus and cannot take NSAIDs.  She is never had an injection in her left knee.  She is frustrated because the pain is preventing her from continuing with her usual activities.   Review of Systems: No fevers or chills No numbness or tingling No chest pain No shortness of breath No bowel or bladder dysfunction No GI distress No headaches   Medical History:  Past Medical History:  Diagnosis Date   Arthritis 02/13/10   abnormal MRI lumbar and c spine  with disc disease   Osteoporosis    SLE (systemic lupus erythematosus) ([place]) dx apprx 1990   reports immunotherapy, cytoxan and steroids at [facility], no f/u for over 13yr    Past Surgical History:  Procedure Laterality Date   ABDOMINAL HYSTERECTOMY  unsure, over 10 yrs   partial, has had abnormal pap   BLADDER REPAIR     COLONOSCOPY N/A 01/05/2014   Procedure: COLONOSCOPY;  Surgeon: RDaneil Dolin MD;  [facility];  Service: Endoscopy;  Laterality: N/A;  12:15 PM   COLONOSCOPY N/A 07/07/2020   Procedure: COLONOSCOPY;  Surgeon: RDaneil Dolin MD;  [facility];  Service: Endoscopy;  Laterality: N/A;  ASA II / AM procedure    Family History  Problem Relation Age of Onset   Heart disease Father    Cancer Brother [phone]      breast   Breast cancer  Brother    Cancer Sister 66       breast   Breast cancer Sister    Colon cancer Neg Hx    Social History   Tobacco Use   Smoking status: Never   Smokeless tobacco: Never  Vaping Use   Vaping Use: Never used  Substance Use Topics   Alcohol use: No   Drug use: No    Allergies  Allergen Reactions   Sulfa Antibiotics Rash    Felt bad   Sulfonamide Derivatives Rash    Felt bad    Current Meds  Medication Sig   alendronate (FOSAMAX) 70 MG tablet TAKE ONE TABLET ('70MG'$  TOTAL) BY MOUTH EVERY 7 DAYS. TAKE WITH A FULL GLASS OF WATER ON AN EMPTY STOMACH.   Calcium Carb-Cholecalciferol (CALCIUM 600+D) 600-800 MG-UNIT TABS  Take 2 tablets by mouth daily.   cholecalciferol (VITAMIN D3) 25 MCG (1000 UNIT) tablet Take 2,000 Units by mouth daily.   Cyanocobalamin (B-12 PO) Take 1 tablet by mouth daily.   ELDERBERRY PO Take by mouth.   hydroxychloroquine (PLAQUENIL) 200 MG tablet TAKE ONE TABLET BY MOUTH TWICE A DAY MONDAY THROUGH FRIDAY.   ipratropium (ATROVENT) 0.06 % nasal spray Place 2 sprays into both nostrils 3 (three) times daily. (Patient taking differently: Place 2 sprays into both nostrils as needed for rhinitis.)   Multiple Vitamins-Minerals (SYSTANE ICAPS AREDS2) CAPS Take 2 capsules by mouth daily.   VITAMIN A PO Take 1 capsule by mouth daily.   vitamin C (ASCORBIC ACID) 500 MG tablet Take 1,000 mg by mouth daily.    Objective: BP (!) 152/80   Pulse 75   Ht '5\' 3"'$  (1.6 m)   Wt 140 lb 3.2 oz (63.6 kg)   BMI 24.84 kg/m   Physical Exam:  General: Alert and oriented. and No acute distress. Gait: Left sided antalgic gait.  Evaluation left knee demonstrates a mild varus alignment overall.  She has full extension, flexion beyond 100 degrees.  Beyond this flexion, she notes pain throughout the knee, as well as in the posterior aspect of her knee.  There is a presence of a Baker's cyst in the popliteal fossa, which is tender to palpation.  She has tenderness to palpation along the medial joint line.  Knee is stable to varus and valgus stress.  Negative Lachman.    IMAGING: I personally ordered and reviewed the following images  X-rays of the left knee were obtained in clinic today and demonstrates a varus alignment overall.  She has loss of joint space medially, with small osteophytes.  There are some small cysts in the medial aspect the medial femoral condyle.  Patellofemoral joint has maintained joint space, small osteophytes on the patella.  Impression: Mild to moderate left knee arthritis  New Medications:  No orders of the defined types were placed in this encounter.     Mordecai Rasmussen,  MD  12/01/2020 10:19 PM

## 2020-12-01 NOTE — Patient Instructions (Addendum)
Please contact the office if you continue to have pain in the back of the knee and we can place an order for an ultrasound guided injection   Instructions Following Joint Injections  In clinic today, you received an injection in one of your joints (sometimes more than one).  Occasionally, you can have some pain at the injection site, this is normal.  You can place ice at the injection site, or take over-the-counter medications such as Tylenol (acetaminophen) or Advil (ibuprofen).  Please follow all directions listed on the bottle.  If your joint (knee or shoulder) becomes swollen, red or very painful, please contact the clinic for additional assistance.   Two medications were injected, including lidocaine and a steroid (often referred to as cortisone).  Lidocaine is effective almost immediately but wears off quickly.  However, the steroid can take a few days to improve your symptoms.  In some cases, it can make your pain worse for a couple of days.  Do not be concerned if this happens as it is common.  You can apply ice or take some over-the-counter medications as needed.       Knee Exercises  Ask your health care provider which exercises are safe for you. Do exercises exactly as told by your health care provider and adjust them as directed. It is normal to feel mild stretching, pulling, tightness, or discomfort as you do these exercises. Stop right away if you feel sudden pain or your pain gets worse. Do not begin these exercises until told by your health care provider.  Stretching and range-of-motion exercises These exercises warm up your muscles and joints and improve the movement and flexibility of your knee. These exercises also help to relieve pain and swelling.  Knee extension, prone Lie on your abdomen (prone position) on a bed. Place your left / right knee just beyond the edge of the surface so your knee is not on the bed. You can put a towel under your left / right thigh just above  your kneecap for comfort. Relax your leg muscles and allow gravity to straighten your knee (extension). You should feel a stretch behind your left / right knee. Hold this position for 10 seconds. Scoot up so your knee is supported between repetitions. Repeat 10 times. Complete this exercise 3-4 times per week.     Knee flexion, active Lie on your back with both legs straight. If this causes back discomfort, bend your left / right knee so your foot is flat on the floor. Slowly slide your left / right heel back toward your buttocks. Stop when you feel a gentle stretch in the front of your knee or thigh (flexion). Hold this position for 10 seconds. Slowly slide your left / right heel back to the starting position. Repeat 10 times. Complete this exercise 3-4 times per week.      Quadriceps stretch, prone Lie on your abdomen on a firm surface, such as a bed or padded floor. Bend your left / right knee and hold your ankle. If you cannot reach your ankle or pant leg, loop a belt around your foot and grab the belt instead. Gently pull your heel toward your buttocks. Your knee should not slide out to the side. You should feel a stretch in the front of your thigh and knee (quadriceps). Hold this position for 10 seconds. Repeat 10 times. Complete this exercise 3-4 times per week.      Hamstring, supine Lie on your back (supine position). Loop  a belt or towel over the ball of your left / right foot. The ball of your foot is on the walking surface, right under your toes. Straighten your left / right knee and slowly pull on the belt to raise your leg until you feel a gentle stretch behind your knee (hamstring). Do not let your knee bend while you do this. Keep your other leg flat on the floor. Hold this position for 10 seconds. Repeat 10 times. Complete this exercise 3-4 times per week.   Strengthening exercises These exercises build strength and endurance in your knee. Endurance is the ability to  use your muscles for a long time, even after they get tired.  Quadriceps, isometric This exercise stretches the muscles in front of your thigh (quadriceps) without moving your knee joint (isometric). Lie on your back with your left / right leg extended and your other knee bent. Put a rolled towel or small pillow under your knee if told by your health care provider. Slowly tense the muscles in the front of your left / right thigh. You should see your kneecap slide up toward your hip or see increased dimpling just above the knee. This motion will push the back of the knee toward the floor. For 10 seconds, hold the muscle as tight as you can without increasing your pain. Relax the muscles slowly and completely. Repeat 10 times. Complete this exercise 3-4 times per week. .     Straight leg raises This exercise stretches the muscles in front of your thigh (quadriceps) and the muscles that move your hips (hip flexors). Lie on your back with your left / right leg extended and your other knee bent. Tense the muscles in the front of your left / right thigh. You should see your kneecap slide up or see increased dimpling just above the knee. Your thigh may even shake a bit. Keep these muscles tight as you raise your leg 4-6 inches (10-15 cm) off the floor. Do not let your knee bend. Hold this position for 10 seconds. Keep these muscles tense as you lower your leg. Relax your muscles slowly and completely after each repetition. Repeat 10 times. Complete this exercise 3-4 times per week.  Hamstring, isometric Lie on your back on a firm surface. Bend your left / right knee about 30 degrees. Dig your left / right heel into the surface as if you are trying to pull it toward your buttocks. Tighten the muscles in the back of your thighs (hamstring) to "dig" as hard as you can without increasing any pain. Hold this position for 10 seconds. Release the tension gradually and allow your muscles to relax  completely for __________ seconds after each repetition. Repeat 10 times. Complete this exercise 3-4 times per week.  Hamstring curls If told by your health care provider, do this exercise while wearing ankle weights. Begin with 5 lb weights. Then increase the weight by 1 lb (0.5 kg) increments. You can also use an exercise band Lie on your abdomen with your legs straight. Bend your left / right knee as far as you can without feeling pain. Keep your hips flat against the floor. Hold this position for 10 seconds. Slowly lower your leg to the starting position. Repeat 10 times. Complete this exercise 3-4 times per week.      Squats This exercise strengthens the muscles in front of your thigh and knee (quadriceps). Stand in front of a table, with your feet and knees pointing straight ahead. You  may rest your hands on the table for balance but not for support. Slowly bend your knees and lower your hips like you are going to sit in a chair. Keep your weight over your heels, not over your toes. Keep your lower legs upright so they are parallel with the table legs. Do not let your hips go lower than your knees. Do not bend lower than told by your health care provider. If your knee pain increases, do not bend as low. Hold the squat position for 10 seconds. Slowly push with your legs to return to standing. Do not use your hands to pull yourself to standing. Repeat 10 times. Complete this exercise 3-4 times per week .     Wall slides This exercise strengthens the muscles in front of your thigh and knee (quadriceps). Lean your back against a smooth wall or door, and walk your feet out 18-24 inches (46-61 cm) from it. Place your feet hip-width apart. Slowly slide down the wall or door until your knees bend 90 degrees. Keep your knees over your heels, not over your toes. Keep your knees in line with your hips. Hold this position for 10 seconds. Repeat 10 times. Complete this exercise 3-4 times per  week.      Straight leg raises This exercise strengthens the muscles that rotate the leg at the hip and move it away from your body (hip abductors). Lie on your side with your left / right leg in the top position. Lie so your head, shoulder, knee, and hip line up. You may bend your bottom knee to help you keep your balance. Roll your hips slightly forward so your hips are stacked directly over each other and your left / right knee is facing forward. Leading with your heel, lift your top leg 4-6 inches (10-15 cm). You should feel the muscles in your outer hip lifting. Do not let your foot drift forward. Do not let your knee roll toward the ceiling. Hold this position for 10 seconds. Slowly return your leg to the starting position. Let your muscles relax completely after each repetition. Repeat 10 times. Complete this exercise 3-4 times per week.      Straight leg raises This exercise stretches the muscles that move your hips away from the front of the pelvis (hip extensors). Lie on your abdomen on a firm surface. You can put a pillow under your hips if that is more comfortable. Tense the muscles in your buttocks and lift your left / right leg about 4-6 inches (10-15 cm). Keep your knee straight as you lift your leg. Hold this position for 10 seconds. Slowly lower your leg to the starting position. Let your leg relax completely after each repetition. Repeat 10 times. Complete this exercise 3-4 times per week.

## 2020-12-02 ENCOUNTER — Other Ambulatory Visit: Payer: Self-pay | Admitting: Physician Assistant

## 2020-12-02 DIAGNOSIS — M3219 Other organ or system involvement in systemic lupus erythematosus: Secondary | ICD-10-CM

## 2020-12-02 NOTE — Telephone Encounter (Signed)
Next Visit: 03/02/2021  Last Visit: 09/29/2020  Labs: 07/20/2020: White blood cell count 4.3, hemoglobin 12.1, platelets 169, creatinine 1.40, GFR 45, ESR 17, ANA negative, C3 123, C4 24, and no proteinuria noted.  Eye exam: 04/26/2020 NORMAL    Current Dose per office note 09/29/2020: Plaquenil 200 mg 1 tablet by mouth twice daily Monday through Friday.   OH:KGOVP systemic lupus erythematosus with other organ involvement   Last Fill: 08/30/2020  Okay to refill Plaquenil?

## 2020-12-06 ENCOUNTER — Encounter: Payer: Self-pay | Admitting: *Deleted

## 2020-12-31 DIAGNOSIS — I129 Hypertensive chronic kidney disease with stage 1 through stage 4 chronic kidney disease, or unspecified chronic kidney disease: Secondary | ICD-10-CM | POA: Diagnosis not present

## 2020-12-31 DIAGNOSIS — N1831 Chronic kidney disease, stage 3a: Secondary | ICD-10-CM | POA: Diagnosis not present

## 2020-12-31 DIAGNOSIS — M3214 Glomerular disease in systemic lupus erythematosus: Secondary | ICD-10-CM | POA: Diagnosis not present

## 2021-01-05 ENCOUNTER — Ambulatory Visit (INDEPENDENT_AMBULATORY_CARE_PROVIDER_SITE_OTHER): Payer: Medicare PPO

## 2021-01-05 ENCOUNTER — Other Ambulatory Visit: Payer: Self-pay

## 2021-01-05 VITALS — Ht 63.0 in | Wt 140.0 lb

## 2021-01-05 DIAGNOSIS — Z0000 Encounter for general adult medical examination without abnormal findings: Secondary | ICD-10-CM | POA: Diagnosis not present

## 2021-01-05 NOTE — Patient Instructions (Signed)
[name] , Thank you for taking time to come for your Medicare Wellness Visit. I appreciate your ongoing commitment to your health goals. Please review the following plan we discussed and let me know if I can assist you in the future.   Screening recommendations/referrals: Colonoscopy: Done 07/07/2020 Repeat in 10 years  Mammogram: Done 03/12/2020 Repeat annually  Bone Density: Done 01/19/2020 Repeat every 2 years  Recommended yearly ophthalmology/optometry visit for glaucoma screening and checkup Recommended yearly dental visit for hygiene and checkup  Vaccinations: Influenza vaccine: Done 11/26/2020 Repeat annually  Pneumococcal vaccine: Done 09/25/2017 and 09/30/2018 Tdap vaccine: Done 01/06/2014 Repeat in 10 years  Shingles vaccine: Shingrix discussed. Please contact your pharmacy for coverage information.     Covid-19:Done 05/22/19, 05/31/19, 01/29/20 and 10/04/20  Advanced directives: Advance directive discussed with you today. Even though you declined this today, please call our office should you change your mind, and we can give you the proper paperwork for you to fill out.   Conditions/risks identified: Aim for 30 minutes of exercise or walking each day, drink 6-8 glasses of water and eat lots of fruits and vegetables. KEEP UP THE GOOD WORK!!!    Next appointment: Follow up in one year for your annual wellness visit 2023.   Preventive Care 79 Years and Older, Female Preventive care refers to lifestyle choices and visits with your health care provider that can promote health and wellness. What does preventive care include? A yearly physical exam. This is also called an annual well check. Dental exams once or twice a year. Routine eye exams. Ask your health care provider how often you should have your eyes checked. Personal lifestyle choices, including: Daily care of your teeth and gums. Regular physical activity. Eating a healthy diet. Avoiding tobacco and drug use. Limiting  alcohol use. Practicing safe sex. Taking low-dose aspirin every day. Taking vitamin and mineral supplements as recommended by your health care provider. What happens during an annual well check? The services and screenings done by your health care provider during your annual well check will depend on your age, overall health, lifestyle risk factors, and family history of disease. Counseling  Your health care provider may ask you questions about your: Alcohol use. Tobacco use. Drug use. Emotional well-being. Home and relationship well-being. Sexual activity. Eating habits. History of falls. Memory and ability to understand (cognition). Work and work Statistician. Reproductive health. Screening  You may have the following tests or measurements: Height, weight, and BMI. Blood pressure. Lipid and cholesterol levels. These may be checked every 5 years, or more frequently if you are over 56 years old. Skin check. Lung cancer screening. You may have this screening every year starting at age 31 if you have a 30-pack-year history of smoking and currently smoke or have quit within the past 15 years. Fecal occult blood test (FOBT) of the stool. You may have this test every year starting at age 53. Flexible sigmoidoscopy or colonoscopy. You may have a sigmoidoscopy every 5 years or a colonoscopy every 10 years starting at age 9. Hepatitis C blood test. Hepatitis B blood test. Sexually transmitted disease (STD) testing. Diabetes screening. This is done by checking your blood sugar (glucose) after you have not eaten for a while (fasting). You may have this done every 1-3 years. Bone density scan. This is done to screen for osteoporosis. You may have this done starting at age 18. Mammogram. This may be done every 1-2 years. Talk to your health care provider about how  often you should have regular mammograms. Talk with your health care provider about your test results, treatment options, and if  necessary, the need for more tests. Vaccines  Your health care provider may recommend certain vaccines, such as: Influenza vaccine. This is recommended every year. Tetanus, diphtheria, and acellular pertussis (Tdap, Td) vaccine. You may need a Td booster every 10 years. Zoster vaccine. You may need this after age 58. Pneumococcal 13-valent conjugate (PCV13) vaccine. One dose is recommended after age 16. Pneumococcal polysaccharide (PPSV23) vaccine. One dose is recommended after age 21. Talk to your health care provider about which screenings and vaccines you need and how often you need them. This information is not intended to replace advice given to you by your health care provider. Make sure you discuss any questions you have with your health care provider. Document Released: 04/09/2015 Document Revised: 12/01/2015 Document Reviewed: 01/12/2015 Elsevier Interactive Patient Education  2017 [place] Prevention in the Home Falls can cause injuries. They can happen to people of all ages. There are many things you can do to make your home safe and to help prevent falls. What can I do on the outside of my home? Regularly fix the edges of walkways and driveways and fix any cracks. Remove anything that might make you trip as you walk through a door, such as a raised step or threshold. Trim any bushes or trees on the path to your home. Use bright outdoor lighting. Clear any walking paths of anything that might make someone trip, such as rocks or tools. Regularly check to see if handrails are loose or broken. Make sure that both sides of any steps have handrails. Any raised decks and porches should have guardrails on the edges. Have any leaves, snow, or ice cleared regularly. Use sand or salt on walking paths during winter. Clean up any spills in your garage right away. This includes oil or grease spills. What can I do in the bathroom? Use night lights. Install grab bars by the toilet  and in the tub and shower. Do not use towel bars as grab bars. Use non-skid mats or decals in the tub or shower. If you need to sit down in the shower, use a plastic, non-slip stool. Keep the floor dry. Clean up any water that spills on the floor as soon as it happens. Remove soap buildup in the tub or shower regularly. Attach bath mats securely with double-sided non-slip rug tape. Do not have throw rugs and other things on the floor that can make you trip. What can I do in the bedroom? Use night lights. Make sure that you have a light by your bed that is easy to reach. Do not use any sheets or blankets that are too big for your bed. They should not hang down onto the floor. Have a firm chair that has side arms. You can use this for support while you get dressed. Do not have throw rugs and other things on the floor that can make you trip. What can I do in the kitchen? Clean up any spills right away. Avoid walking on wet floors. Keep items that you use a lot in easy-to-reach places. If you need to reach something above you, use a strong step stool that has a grab bar. Keep electrical cords out of the way. Do not use floor polish or wax that makes floors slippery. If you must use wax, use non-skid floor wax. Do not have throw rugs and other things  on the floor that can make you trip. What can I do with my stairs? Do not leave any items on the stairs. Make sure that there are handrails on both sides of the stairs and use them. Fix handrails that are broken or loose. Make sure that handrails are as long as the stairways. Check any carpeting to make sure that it is firmly attached to the stairs. Fix any carpet that is loose or worn. Avoid having throw rugs at the top or bottom of the stairs. If you do have throw rugs, attach them to the floor with carpet tape. Make sure that you have a light switch at the top of the stairs and the bottom of the stairs. If you do not have them, ask someone to add  them for you. What else can I do to help prevent falls? Wear shoes that: Do not have high heels. Have rubber bottoms. Are comfortable and fit you well. Are closed at the toe. Do not wear sandals. If you use a stepladder: Make sure that it is fully opened. Do not climb a closed stepladder. Make sure that both sides of the stepladder are locked into place. Ask someone to hold it for you, if possible. Clearly mark and make sure that you can see: Any grab bars or handrails. First and last steps. Where the edge of each step is. Use tools that help you move around (mobility aids) if they are needed. These include: Canes. Walkers. Scooters. Crutches. Turn on the lights when you go into a dark area. Replace any light bulbs as soon as they burn out. Set up your furniture so you have a clear path. Avoid moving your furniture around. If any of your floors are uneven, fix them. If there are any pets around you, be aware of where they are. Review your medicines with your doctor. Some medicines can make you feel dizzy. This can increase your chance of falling. Ask your doctor what other things that you can do to help prevent falls. This information is not intended to replace advice given to you by your health care provider. Make sure you discuss any questions you have with your health care provider. Document Released: 01/07/2009 Document Revised: 08/19/2015 Document Reviewed: 04/17/2014 Elsevier Interactive Patient Education  2017 [employer].

## 2021-01-05 NOTE — Progress Notes (Signed)
Subjective:   [name] is a 70 y.o. female who presents for Medicare Annual (Subsequent) preventive examination. Virtual Visit via Telephone Note  I connected with  [name] on 01/05/21 at  9:40 AM EDT by telephone and verified that I am speaking with the correct person using two identifiers.  Location: Patient: HOME Provider: [facility] Persons participating in the virtual visit: patient/Nurse Health Advisor   I discussed the limitations, risks, security and privacy concerns of performing an evaluation and management service by telephone and the availability of in person appointments. The patient expressed understanding and agreed to proceed.  Interactive audio and video telecommunications were attempted between this nurse and patient, however failed, due to patient having technical difficulties OR patient did not have access to video capability.  We continued and completed visit with audio only.  Some vital signs may be absent or patient reported.   Ariel Driver, LPN  Review of Systems     Cardiac Risk Factors include: advanced age (>54men, >48 women);hypertension;sedentary lifestyle     Objective:    Today's Vitals   01/05/21 0933 01/05/21 0934  Weight: 140 lb (63.5 kg)   Height: 5\' 3"  (1.6 m)   PainSc:  0-No pain   Body mass index is 24.8 kg/m.  Advanced Directives 01/05/2021 08/16/2020 07/07/2020 01/05/2020 01/05/2014  Does Patient Have a Medical Advance Directive? No No No No No  Would patient like information on creating a medical advance directive? No - Patient declined No - Patient declined No - Patient declined No - Patient declined Yes - Educational materials given    Current Medications (verified) Outpatient Encounter Medications as of 01/05/2021  Medication Sig   alendronate (FOSAMAX) 70 MG tablet TAKE ONE TABLET (70MG  TOTAL) BY MOUTH EVERY 7 DAYS. TAKE WITH A FULL GLASS OF WATER ON AN EMPTY STOMACH.   Calcium Carb-Cholecalciferol (CALCIUM  600+D) 600-800 MG-UNIT TABS Take 2 tablets by mouth daily.   cholecalciferol (VITAMIN D3) 25 MCG (1000 UNIT) tablet Take 2,000 Units by mouth daily.   Cyanocobalamin (B-12 PO) Take 1 tablet by mouth daily.   ELDERBERRY PO Take by mouth.   hydroxychloroquine (PLAQUENIL) 200 MG tablet TAKE ONE TABLET BY MOUTH TWICE A DAY MONDAY THROUGH FRIDAY.   ipratropium (ATROVENT) 0.06 % nasal spray Place 2 sprays into both nostrils 3 (three) times daily. (Patient taking differently: Place 2 sprays into both nostrils as needed for rhinitis.)   Multiple Vitamins-Minerals (SYSTANE ICAPS AREDS2) CAPS Take 2 capsules by mouth daily.   VITAMIN A PO Take 1 capsule by mouth daily.   vitamin C (ASCORBIC ACID) 500 MG tablet Take 1,000 mg by mouth daily.   No facility-administered encounter medications on file as of 01/05/2021.    Allergies (verified) Sulfa antibiotics and Sulfonamide derivatives   History: Past Medical History:  Diagnosis Date   Arthritis 02/13/10   abnormal MRI lumbar and c spine  with disc disease   Osteoporosis    SLE (systemic lupus erythematosus) ([place]) dx apprx 1990   reports immunotherapy, cytoxan and steroids at [facility], no f/u for over 53yrs   Past Surgical History:  Procedure Laterality Date   ABDOMINAL HYSTERECTOMY  unsure, over 10 yrs   partial, has had abnormal pap   BLADDER REPAIR     COLONOSCOPY N/A 01/05/2014   Procedure: COLONOSCOPY;  Surgeon: Daneil Dolin, MD;  [facility];  Service: Endoscopy;  Laterality: N/A;  12:15 PM   COLONOSCOPY N/A 07/07/2020   Procedure: COLONOSCOPY;  Surgeon: Daneil Dolin, MD;  [facility];  Service: Endoscopy;  Laterality: N/A;  ASA II / AM procedure   Family History  Problem Relation Age of Onset   Heart disease Father    Cancer Brother 31       breast   Breast cancer Brother    Cancer Sister 44       breast   Breast cancer Sister    Colon cancer Neg Hx    Social History   Socioeconomic History   Marital  status: Married    Spouse name: [name]   Number of children: 3   Years of education: Not on file   Highest education level: Not on file  Occupational History   Not on file  Tobacco Use   Smoking status: Never   Smokeless tobacco: Never  Vaping Use   Vaping Use: Never used  Substance and Sexual Activity   Alcohol use: No   Drug use: No   Sexual activity: Not Currently  Other Topics Concern   Not on file  Social History Narrative   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   Social Determinants of Radio broadcast assistant Strain: Low Risk    Difficulty of Paying Living Expenses: Not hard at all  Food Insecurity: No Food Insecurity   Worried About Charity fundraiser in the Last Year: Never true   Arboriculturist in the Last Year: Never true  Transportation Needs: No Transportation Needs   Lack of Transportation (Medical): No   Lack of Transportation (Non-Medical): No  Physical Activity: Sufficiently Active   Days of Exercise per Week: 5 days   Minutes of Exercise per Session: 30 min  Stress: No Stress Concern Present   Feeling of Stress : Not at all  Social Connections: Socially Integrated   Frequency of Communication with Friends and Family: More than three times a week   Frequency of Social Gatherings with Friends and Family: More than three times a week   Attends Religious Services: More than 4 times per year   Active Member of [employer] or Organizations: Yes   Attends Music therapist: More than 4 times per year   Marital Status: Married    Tobacco Counseling Counseling given: Not Answered   Clinical Intake:  Pre-visit preparation completed: Yes  Pain : No/denies pain Pain Score: 0-No pain     BMI - recorded: 24.8 Nutritional Status: BMI of 19-24  Normal Nutritional Risks: None Diabetes: No  How often do you need to have someone help you when you read instructions, pamphlets, or other written materials from your doctor  or pharmacy?: 1 - Never  Diabetic?NO  Interpreter Needed?: No  Information entered by :: MJ Kensington Rios,LPN   Activities of Daily Living In your present state of health, do you have any difficulty performing the following activities: 01/05/2021  Hearing? N  Vision? N  Difficulty concentrating or making decisions? N  Walking or climbing stairs? N  Dressing or bathing? N  Doing errands, shopping? N  Preparing Food and eating ? N  Using the Toilet? N  In the past six months, have you accidently leaked urine? N  Do you have problems with loss of bowel control? N  Managing your Medications? N  Managing your Finances? N  Housekeeping or managing your Housekeeping? N  Some recent data might be hidden    Patient Care Team: [name], MD as PCP - General  Rourk, Cristopher Estimable, MD as Consulting Physician (Gastroenterology)  Indicate any recent Medical Services you may have received from other than [facility] providers in the past year (date may be approximate).     Assessment:   This is a routine wellness examination for Niala.  Hearing/Vision screen Hearing Screening - Comments:: No hearing issues.  Vision Screening - Comments:: Glasses, [facility], overdue.   Dietary issues and exercise activities discussed: Current Exercise Habits: Home exercise routine, Type of exercise: walking;strength training/weights;stretching (Chair exercises), Time (Minutes): 30, Frequency (Times/Week): 5, Weekly Exercise (Minutes/Week): 150, Intensity: Mild, Exercise limited by: cardiac condition(s)   Goals Addressed             This Visit's Progress    Have 3 meals a day       Cut back on junk food.     Increase physical activity   On track    Start exercising more, several times a week.        Depression Screen PHQ 2/9 Scores 01/05/2021 11/26/2020 10/04/2020 07/19/2020 04/05/2020 02/12/2020 01/05/2020  PHQ - 2 Score 0 0 0 0 0 0 0  PHQ- 9 Score - - - - - - -    Fall Risk Fall Risk  01/05/2021  11/26/2020 10/04/2020 07/19/2020 04/05/2020  Falls in the past year? 0 0 0 1 0  Comment - - - - -  Number falls in past yr: 0 0 0 0 0  Injury with Fall? 0 0 0 0 0  Comment - - - - -  Risk for fall due to : No Fall Risks - - Impaired balance/gait -  Risk for fall due to: Comment - - - - -  Follow up Falls prevention discussed - - Falls evaluation completed -    FALL RISK PREVENTION PERTAINING TO THE HOME:  Any stairs in or around the home? No  If so, are there any without handrails? Yes  Home free of loose throw rugs in walkways, pet beds, electrical cords, etc? Yes  Adequate lighting in your home to reduce risk of falls? Yes   ASSISTIVE DEVICES UTILIZED TO PREVENT FALLS:  Life alert? No  Use of a cane, walker or w/c? No  Grab bars in the bathroom? Yes  Shower chair or bench in shower? No  Elevated toilet seat or a handicapped toilet? No   TIMED UP AND GO:  Was the test performed? No . Phone visit   Cognitive Function:     6CIT Screen 01/05/2021 01/05/2020 12/13/2018  What Year? 0 points 0 points 0 points  What month? 0 points 0 points 0 points  What time? 0 points 0 points 0 points  Count back from 20 0 points 0 points 0 points  Months in reverse 0 points 0 points 0 points  Repeat phrase 0 points 0 points 0 points  Total Score 0 0 0    Immunizations Immunization History  Administered Date(s) Administered   Fluad Quad(high Dose 65+) 11/18/2018, 01/05/2020, 11/26/2020   Influenza Split 02/14/2012   Influenza Whole 12/28/2000   Influenza,inj,Quad PF,6+ Mos 12/16/2012, 01/06/2014, 01/19/2016, 12/10/2017   Moderna Sars-Covid-2 Vaccination 05/02/2019, 05/31/2019, 01/29/2020, 10/04/2020   Pneumococcal Conjugate-13 09/25/2017   Pneumococcal Polysaccharide-23 09/30/2018   Td 09/17/2003   Tdap 01/06/2014   Zoster, Live 12/17/2012    TDAP status: Up to date  Flu Vaccine status: Up to date  Pneumococcal vaccine status: Up to date  Covid-19 vaccine status: Completed  vaccines  Qualifies for Shingles Vaccine? Yes   Zostavax  completed Yes   Shingrix Completed?: No.    Education has been provided regarding the importance of this vaccine. Patient has been advised to call insurance company to determine out of pocket expense if they have not yet received this vaccine. Advised may also receive vaccine at local pharmacy or Health Dept. Verbalized acceptance and understanding.  Screening Tests Health Maintenance  Topic Date Due   Hepatitis C Screening  07/19/2021 (Originally 06/13/1968)   Zoster Vaccines- Shingrix (1 of 2) 10/08/2024 (Originally 06/13/1969)   COVID-19 Vaccine (5 - Booster for Moderna series) 02/04/2021   MAMMOGRAM  03/12/2022   TETANUS/TDAP  01/07/2024   COLONOSCOPY (Pts 45-32yrs Insurance coverage will need to be confirmed)  07/08/2030   INFLUENZA VACCINE  Completed   DEXA SCAN  Completed   HPV VACCINES  Aged Out    Health Maintenance  There are no preventive care reminders to display for this patient.  Colorectal cancer screening: Type of screening: Colonoscopy. Completed 07/07/2020. Repeat every 10 years  Mammogram status: Completed 03/12/2020. Repeat every year  Bone Density status: Completed 01/19/20. Results reflect: Bone density results: OSTEOPENIA. Repeat every 2 years.  Lung Cancer Screening: (Low Dose CT Chest recommended if Age 1-80 years, 30 pack-year currently smoking OR have quit w/in 15years.) does not qualify.    Additional Screening:  Hepatitis C Screening: does qualify; Completed Postponed until 07/19/2021.  Vision Screening: Recommended annual ophthalmology exams for early detection of glaucoma and other disorders of the eye. Is the patient up to date with their annual eye exam?  No  Who is the provider or what is the name of the office in which the patient attends annual eye exams? [facility] If pt is not established with a provider, would they like to be referred to a provider to establish care? No .   Dental  Screening: Recommended annual dental exams for proper oral hygiene  Community Resource Referral / Chronic Care Management: CRR required this visit?  No   CCM required this visit?  No      Plan:     I have personally reviewed and noted the following in the patient's chart:   Medical and social history Use of alcohol, tobacco or illicit drugs  Current medications and supplements including opioid prescriptions.  Functional ability and status Nutritional status Physical activity Advanced directives List of other physicians Hospitalizations, surgeries, and ER visits in previous 12 months Vitals Screenings to include cognitive, depression, and falls Referrals and appointments  In addition, I have reviewed and discussed with patient certain preventive protocols, quality metrics, and best practice recommendations. A written personalized care plan for preventive services as well as general preventive health recommendations were provided to patient.     Ariel Driver, LPN   03/49/1791   Nurse Notes: Pt states she is doing well. Up to date on Health Maintenance. Overdue for eye exam but pt states she will call and schedule.

## 2021-01-07 DIAGNOSIS — I129 Hypertensive chronic kidney disease with stage 1 through stage 4 chronic kidney disease, or unspecified chronic kidney disease: Secondary | ICD-10-CM | POA: Diagnosis not present

## 2021-01-07 DIAGNOSIS — N1831 Chronic kidney disease, stage 3a: Secondary | ICD-10-CM | POA: Diagnosis not present

## 2021-01-07 DIAGNOSIS — M3214 Glomerular disease in systemic lupus erythematosus: Secondary | ICD-10-CM | POA: Diagnosis not present

## 2021-01-12 DIAGNOSIS — H353131 Nonexudative age-related macular degeneration, bilateral, early dry stage: Secondary | ICD-10-CM | POA: Diagnosis not present

## 2021-01-12 DIAGNOSIS — Z79899 Other long term (current) drug therapy: Secondary | ICD-10-CM | POA: Diagnosis not present

## 2021-01-12 DIAGNOSIS — H35453 Secondary pigmentary degeneration, bilateral: Secondary | ICD-10-CM | POA: Diagnosis not present

## 2021-01-12 DIAGNOSIS — H25813 Combined forms of age-related cataract, bilateral: Secondary | ICD-10-CM | POA: Diagnosis not present

## 2021-01-12 DIAGNOSIS — H35033 Hypertensive retinopathy, bilateral: Secondary | ICD-10-CM | POA: Diagnosis not present

## 2021-01-12 DIAGNOSIS — H35363 Drusen (degenerative) of macula, bilateral: Secondary | ICD-10-CM | POA: Diagnosis not present

## 2021-01-12 DIAGNOSIS — H04123 Dry eye syndrome of bilateral lacrimal glands: Secondary | ICD-10-CM | POA: Diagnosis not present

## 2021-02-18 NOTE — Progress Notes (Signed)
Office Visit Note  Patient: [name]             Date of Birth: [birth year]           MRN: [id number]             PCP: [name], MD Referring: [name], MD Visit Date: 03/02/2021 Occupation: @GUAROCC @  Subjective:  Left knee swelling.   History of Present Illness: [name] is a 70 y.o. female with a history of systemic lupus erythematosus, lupus nephritis, osteoarthritis, degenerative disc disease and osteoporosis.  She states she is been having pain and swelling in her left knee joint.  She had a cortisone injection in her left knee joint on December 01, 2020 by an orthopedic surgeon.  She states the pain and swelling has recurred now.  She is having difficulty walking due to left knee joint discomfort.  None of the other joints are painful or swollen.  She denies any history of oral ulcers, nasal ulcers, malar rash, photosensitivity, Raynaud's phenomenon or lymphadenopathy.  She was recently seen by Dr. Verne Spurr and had extensive lab work which was stable.  Activities of Daily Living:  Patient reports morning stiffness for 5-10 minutes.   Patient Denies nocturnal pain.  Difficulty dressing/grooming: Denies Difficulty climbing stairs: Denies Difficulty getting out of chair: Denies Difficulty using hands for taps, buttons, cutlery, and/or writing: Denies  Review of Systems  Constitutional:  Negative for fatigue, night sweats, weight gain and weight loss.  HENT:  Positive for mouth dryness. Negative for mouth sores, trouble swallowing, trouble swallowing and nose dryness.   Eyes:  Positive for dryness. Negative for pain, redness, itching and visual disturbance.  Respiratory:  Negative for cough, shortness of breath and difficulty breathing.   Cardiovascular:  Negative for chest pain, palpitations, hypertension, irregular heartbeat and swelling in legs/feet.  Gastrointestinal:  Negative for blood in stool, constipation and diarrhea.  Endocrine:  Negative for increased urination.  Genitourinary:  Negative for difficulty urinating and vaginal dryness.  Musculoskeletal:  Positive for joint pain, joint pain and morning stiffness. Negative for joint swelling, myalgias, muscle weakness, muscle tenderness and myalgias.  Skin:  Negative for color change, rash, hair loss, redness, skin tightness, ulcers and sensitivity to sunlight.  Allergic/Immunologic: Negative for susceptible to infections.  Neurological:  Negative for dizziness, numbness, headaches, memory loss, night sweats and weakness.  Hematological:  Negative for bruising/bleeding tendency and swollen glands.  Psychiatric/Behavioral:  Negative for depressed mood, confusion and sleep disturbance. The patient is not nervous/anxious.    PMFS History:  Patient Active Problem List   Diagnosis Date Noted   Baker's cyst of knee, left 11/26/2020   Seborrheic dermatitis of scalp 07/19/2020   Elevated BP without diagnosis of hypertension 07/19/2020   Overweight (BMI 25.0-29.9) 07/19/2020   Systemic lupus erythematosus arthritis ([place]) 02/12/2020   Allergic sinusitis 11/04/2018   Hyperlipidemia 03/08/2017   Proteinuria 03/08/2017   SLE glomerulonephritis syndrome ([place]) 09/15/2016   High risk medication use 09/15/2016   Degeneration of lumbar intervertebral disc 09/15/2016   Personal history of other endocrine, nutritional and metabolic disease 83/15/1761   History of anemia 09/15/2016   History of proteinuria  09/15/2016   Tubular adenoma of colon 04/30/2016   Prediabetes 11/15/2014   Osteopenia 07/21/2013   Anemia 12/22/2012   Systemic lupus erythematosus ([place]) 02/15/2012   H/O: gastrointestinal disease 10/01/2007    Past Medical History:  Diagnosis Date   Arthritis 02/13/10   abnormal MRI lumbar and c  spine  with disc disease   Osteoporosis    SLE (systemic lupus erythematosus) ([place]) dx apprx 1990   reports immunotherapy, cytoxan and steroids at [facility], no f/u for over 26yrs     Family History  Problem Relation Age of Onset   Heart disease Father    Cancer Brother 43       breast   Breast cancer Brother    Cancer Sister 53       breast   Breast cancer Sister    Colon cancer Neg Hx    Past Surgical History:  Procedure Laterality Date   ABDOMINAL HYSTERECTOMY  unsure, over 10 yrs   partial, has had abnormal pap   BLADDER REPAIR     COLONOSCOPY N/A 01/05/2014   Procedure: COLONOSCOPY;  Surgeon: Daneil Dolin, MD;  [facility];  Service: Endoscopy;  Laterality: N/A;  12:15 PM   COLONOSCOPY N/A 07/07/2020   Procedure: COLONOSCOPY;  Surgeon: Daneil Dolin, MD;  [facility];  Service: Endoscopy;  Laterality: N/A;  ASA II / AM procedure   Social History   Social History Narrative   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   Immunization History  Administered Date(s) Administered   Fluad Quad(high Dose 65+) 11/18/2018, 01/05/2020, 11/26/2020   Influenza Split 02/14/2012   Influenza Whole 12/28/2000   Influenza,inj,Quad PF,6+ Mos 12/16/2012, 01/06/2014, 01/19/2016, 12/10/2017   Moderna Sars-Covid-2 Vaccination 05/02/2019, 05/31/2019, 01/29/2020, 10/04/2020   Pneumococcal Conjugate-13 09/25/2017   Pneumococcal Polysaccharide-23 09/30/2018   Td 09/17/2003   Tdap 01/06/2014   Zoster, Live 12/17/2012     Objective: Vital Signs: BP 135/80 (BP Location: Left Arm, Patient Position: Sitting, Cuff Size: Normal)   Pulse 78   Ht 5\' 3"  (1.6 m)   Wt 136 lb 3.2 oz (61.8 kg)   BMI 24.13 kg/m    Physical Exam Vitals and nursing note reviewed.  Constitutional:      Appearance: She is well-developed.  HENT:     Head: Normocephalic and atraumatic.  Eyes:     Conjunctiva/sclera: Conjunctivae normal.  Cardiovascular:     Rate and Rhythm: Normal rate and regular rhythm.     Heart sounds: Normal heart sounds.  Pulmonary:     Effort: Pulmonary effort is normal.     Breath sounds: Normal breath sounds.  Abdominal:      General: Bowel sounds are normal.     Palpations: Abdomen is soft.  Musculoskeletal:     Cervical back: Normal range of motion.  Lymphadenopathy:     Cervical: No cervical adenopathy.  Skin:    General: Skin is warm and dry.     Capillary Refill: Capillary refill takes less than 2 seconds.  Neurological:     Mental Status: She is alert and oriented to person, place, and time.  Psychiatric:        Behavior: Behavior normal.     Musculoskeletal Exam: C-spine thoracic and lumbar spine were in good range of motion.  Shoulder joints, elbow joints, wrist joints, MCPs PIPs and DIPs with good range of motion.  Hip joints with good range of motion.  She had warmth swelling and small effusion in her left knee joint.  There is no tenderness over ankles or MTPs.  CDAI Exam: CDAI Score: -- Patient Global: --; Provider Global: -- Swollen: --; Tender: -- Joint Exam 03/02/2021   No joint exam has been documented for this visit   There is currently no information documented on the homunculus.  Go to the Rheumatology activity and complete the homunculus joint exam.  Investigation: No additional findings.  Imaging: No results found.  Recent Labs: Lab Results  Component Value Date   WBC 3.5 (L) 11/28/2019   HGB 12.2 11/28/2019   PLT 160 11/28/2019   NA 138 11/28/2019   K 4.5 11/28/2019   CL 103 11/28/2019   CO2 28 11/28/2019   GLUCOSE 82 11/28/2019   BUN 16 11/28/2019   CREATININE 1.35 (H) 11/28/2019   BILITOT 0.5 11/28/2019   ALKPHOS 43 09/18/2016   AST 29 11/28/2019   ALT 20 11/28/2019   PROT 7.2 11/28/2019   ALBUMIN 3.8 09/18/2016   CALCIUM 9.8 11/28/2019   GFRAA 47 (L) 11/28/2019    Speciality Comments: Plaquenil eye exam normal on 04/26/2020 NORMAL 6 months  per my eye doctor in [facility]  December 31, 2020 labs done at Dr. Toya Smothers office showed CBC WBC 3.8, hemoglobin 11.9, platelets 156, BMP creatinine 1.17, GFR 51, phosphorus normal, C3-C4 normal, UA negative for  protein, ANA 1: 80 cytoplasmic, dsDNA 5, Smith positive, RNP positive  Procedures:  Large Joint Inj: L knee on 03/02/2021 10:26 AM Indications: pain and joint swelling Details: 27 G 1.5 in needle, medial approach  Arthrogram: No  Medications: 40 mg triamcinolone acetonide 40 MG/ML; 5 mL lidocaine 1 % Aspirate: 0 mL Outcome: tolerated well, no immediate complications Procedure, treatment alternatives, risks and benefits explained, specific risks discussed. Consent was given by the patient. Immediately prior to procedure a time out was called to verify the correct patient, procedure, equipment, support staff and site/side marked as required. Patient was prepped and draped in the usual sterile fashion.    Allergies: Sulfa antibiotics and Sulfonamide derivatives   Assessment / Plan:     Visit Diagnoses: Other systemic lupus erythematosus with other organ involvement ([place]) - History of dsDNA +64, Smith + 5.5, RNP+ 6.5, SSA([place]) Negative, SSB ([place]) Negative, presented initially with nephritis, hair loss, arthralgias, dermatitis -patient had recent labs done by Dr. Verne Spurr which were stable.  Plan: Protein / creatinine ratio, urine, Urinalysis, Routine w reflex microscopic, Anti-DNA antibody, double-stranded, C3 and C4, Sedimentation rate and January  Lupus nephritis ([place]) 1999-2000 -  treated with Cytoxan IV and prednisone for 1 year.  She follows up with her nephrologist every year (Dr. Theador Hawthorne).  - Plan: CBC with Differential/Platelet, COMPLETE METABOLIC PANEL WITH GFR in January.    High risk medication use - Plaquenil 200 mg 1 tablet by mouth twice daily Monday through Friday. Plaquenil eye exam normal on 04/26/2020 NORMAL 6 months per my eye doctor in [facility].Information regarding immunization was also placed in the AVS.  Her eye examination was on April 26, 2020.  She states the next appointment is scheduled in February.  Effusion, left knee-she has been experiencing pain and discomfort  in her left knee joint for the last several months.  She states in September she had cortisone injection in her left knee joint and x-ray by the orthopedic surgeon.  She does not know the results of the x-rays.  She said the pain and swelling has recurred now.  She had warmth, swelling and small effusion in her left knee joint today.  No synovial fluid was aspirated.  The knee joint was injected with lidocaine and cortisone as described above.  She taught the procedure well.  I advised her to contact us if she has recurrence of swelling.  DDD (degenerative disc disease), lumbar-currently not having any discomfort.  Age-related  osteoporosis without current pathological fracture - DEXA 01/19/2020: The BMD measured at Forearm Radius 33% is 0.549 g/cm2 with a T-score of -2.3.  Managed by Dr. Moshe Cipro. fosamax-started 2019  Other medical problems are listed as follows:  History of hyperlipidemia  History of proteinuria syndrome  History of diverticulosis  History of adenomatous polyp of colon  History of prediabetes  History of anemia  Orders: Orders Placed This Encounter  Procedures   Protein / creatinine ratio, urine   CBC with Differential/Platelet   COMPLETE METABOLIC PANEL WITH GFR   Urinalysis, Routine w reflex microscopic   Anti-DNA antibody, double-stranded   C3 and C4   Sedimentation rate   No orders of the defined types were placed in this encounter.    Follow-Up Instructions: Return in about 5 months (around 07/31/2021) for Systemic lupus, Osteoarthritis.   Bo Merino, MD  Note - This record has been created using Editor, commissioning.  Chart creation errors have been sought, but may not always  have been located. Such creation errors do not reflect on  the standard of medical care.

## 2021-03-02 ENCOUNTER — Other Ambulatory Visit: Payer: Self-pay

## 2021-03-02 ENCOUNTER — Encounter: Payer: Self-pay | Admitting: Rheumatology

## 2021-03-02 ENCOUNTER — Ambulatory Visit: Payer: Medicare PPO | Admitting: Rheumatology

## 2021-03-02 VITALS — BP 135/80 | HR 78 | Ht 63.0 in | Wt 136.2 lb

## 2021-03-02 DIAGNOSIS — M810 Age-related osteoporosis without current pathological fracture: Secondary | ICD-10-CM | POA: Diagnosis not present

## 2021-03-02 DIAGNOSIS — M5136 Other intervertebral disc degeneration, lumbar region: Secondary | ICD-10-CM

## 2021-03-02 DIAGNOSIS — Z87898 Personal history of other specified conditions: Secondary | ICD-10-CM

## 2021-03-02 DIAGNOSIS — M3219 Other organ or system involvement in systemic lupus erythematosus: Secondary | ICD-10-CM | POA: Diagnosis not present

## 2021-03-02 DIAGNOSIS — Z87448 Personal history of other diseases of urinary system: Secondary | ICD-10-CM

## 2021-03-02 DIAGNOSIS — Z86010 Personal history of colonic polyps: Secondary | ICD-10-CM

## 2021-03-02 DIAGNOSIS — Z79899 Other long term (current) drug therapy: Secondary | ICD-10-CM

## 2021-03-02 DIAGNOSIS — Z8639 Personal history of other endocrine, nutritional and metabolic disease: Secondary | ICD-10-CM

## 2021-03-02 DIAGNOSIS — M3214 Glomerular disease in systemic lupus erythematosus: Secondary | ICD-10-CM

## 2021-03-02 DIAGNOSIS — M25462 Effusion, left knee: Secondary | ICD-10-CM | POA: Diagnosis not present

## 2021-03-02 DIAGNOSIS — Z8719 Personal history of other diseases of the digestive system: Secondary | ICD-10-CM

## 2021-03-02 DIAGNOSIS — Z862 Personal history of diseases of the blood and blood-forming organs and certain disorders involving the immune mechanism: Secondary | ICD-10-CM

## 2021-03-02 MED ORDER — LIDOCAINE HCL 1 % IJ SOLN
5.0000 mL | INTRAMUSCULAR | Status: AC | PRN
  Administered 2021-03-02: 5 mL

## 2021-03-02 MED ORDER — TRIAMCINOLONE ACETONIDE 40 MG/ML IJ SUSP
40.0000 mg | INTRAMUSCULAR | Status: AC | PRN
  Administered 2021-03-02: 40 mg via INTRA_ARTICULAR

## 2021-03-02 NOTE — Patient Instructions (Signed)
Standing Labs We placed an order today for your standing lab work.   Please have your standing labs drawn in January and every 3 months  If possible, please have your labs drawn 2 weeks prior to your appointment so that the provider can discuss your results at your appointment.  Please note that you may see your imaging and lab results in [facility] before we have reviewed them. We may be awaiting multiple results to interpret others before contacting you. Please allow our office up to 72 hours to thoroughly review all of the results before contacting the office for clarification of your results.  We have open lab daily: Monday through Thursday from 1:30-4:30 PM and Friday from 1:30-4:00 PM at the office of Dr. Bo Merino, [facility].   Please be advised, all patients with office appointments requiring lab work will take precedent over walk-in lab work.  If possible, please come for your lab work on Monday and Friday afternoons, as you may experience shorter wait times. The office is located at [address] [place], [place], [place] [address] No appointment is necessary.   Labs are drawn by [facility]. Please bring your co-pay at the time of your lab draw.  You may receive a bill from [facility] for your lab work.  If you wish to have your labs drawn at another location, please call the office 24 hours in advance to send orders.  If you have any questions regarding directions or hours of operation,  please call [phone].   As a reminder, please drink plenty of water prior to coming for your lab work. Thanks!   Vaccines You are taking a medication(s) that can suppress your immune system.  The following immunizations are recommended: Flu annually Covid-19  Td/Tdap (tetanus, diphtheria, pertussis) every 10 years Pneumonia (Prevnar 15 then Pneumovax 23 at least 1 year apart.  Alternatively, can take Prevnar 20 without needing additional dose) Shingrix: 2 doses from 4 weeks  to 6 months apart  Please check with your PCP to make sure you are up to date.   Heart Disease Prevention   Your inflammatory disease increases your risk of heart disease which includes heart attack, stroke, atrial fibrillation (irregular heartbeats), high blood pressure, heart failure and atherosclerosis (plaque in the arteries).  It is important to reduce your risk by:   Keep blood pressure, cholesterol, and blood sugar at healthy levels   Smoking Cessation   Maintain a healthy weight  BMI 20-25   Eat a healthy diet  Plenty of fresh fruit, vegetables, and whole grains  Limit saturated fats, foods high in sodium, and added sugars  DASH and Mediterranean diet   Increase physical activity  Recommend moderate physically activity for 150 minutes per week/ 30 minutes a day for five days a week These can be broken up into three separate ten-minute sessions during the day.   Reduce Stress  Meditation, slow breathing exercises, yoga, coloring books  Dental visits twice a year

## 2021-03-14 ENCOUNTER — Ambulatory Visit (HOSPITAL_COMMUNITY)
Admission: RE | Admit: 2021-03-14 | Discharge: 2021-03-14 | Disposition: A | Discharge status: Home / Self Care | Payer: Medicare PPO | Source: Ambulatory Visit | Attending: Family Medicine | Admitting: Family Medicine

## 2021-03-14 ENCOUNTER — Other Ambulatory Visit: Payer: Self-pay

## 2021-03-14 DIAGNOSIS — Z1231 Encounter for screening mammogram for malignant neoplasm of breast: Secondary | ICD-10-CM | POA: Insufficient documentation

## 2021-03-18 ENCOUNTER — Other Ambulatory Visit: Payer: Self-pay | Admitting: Physician Assistant

## 2021-03-18 DIAGNOSIS — M3219 Other organ or system involvement in systemic lupus erythematosus: Secondary | ICD-10-CM

## 2021-03-18 NOTE — Telephone Encounter (Signed)
Next Visit: 08/03/2021  Last Visit: 03/02/2021  Labs: per office note on 03/02/2021: patient had recent labs done by Dr. Verne Spurr which were stable.  Eye exam: 04/26/2020   Current Dose per office note on 03/02/2021: Plaquenil 200 mg 1 tablet by mouth twice daily Monday through Friday.  DX: Other systemic lupus erythematosus with other organ involvement  Last Fill: 12/02/2020  Okay to refill Plaquenil?

## 2021-04-06 ENCOUNTER — Ambulatory Visit: Payer: Medicare PPO | Admitting: Family Medicine

## 2021-04-06 ENCOUNTER — Encounter: Payer: Self-pay | Admitting: Family Medicine

## 2021-04-06 ENCOUNTER — Other Ambulatory Visit: Payer: Self-pay

## 2021-04-06 VITALS — BP 150/85 | HR 74 | Ht 63.0 in | Wt 136.0 lb

## 2021-04-06 DIAGNOSIS — I10 Essential (primary) hypertension: Secondary | ICD-10-CM | POA: Diagnosis not present

## 2021-04-06 DIAGNOSIS — E559 Vitamin D deficiency, unspecified: Secondary | ICD-10-CM

## 2021-04-06 DIAGNOSIS — G8929 Other chronic pain: Secondary | ICD-10-CM | POA: Diagnosis not present

## 2021-04-06 DIAGNOSIS — E782 Mixed hyperlipidemia: Secondary | ICD-10-CM | POA: Diagnosis not present

## 2021-04-06 DIAGNOSIS — R7303 Prediabetes: Secondary | ICD-10-CM

## 2021-04-06 DIAGNOSIS — M542 Cervicalgia: Secondary | ICD-10-CM

## 2021-04-06 DIAGNOSIS — L219 Seborrheic dermatitis, unspecified: Secondary | ICD-10-CM | POA: Diagnosis not present

## 2021-04-06 DIAGNOSIS — M5442 Lumbago with sciatica, left side: Secondary | ICD-10-CM | POA: Diagnosis not present

## 2021-04-06 DIAGNOSIS — R030 Elevated blood-pressure reading, without diagnosis of hypertension: Secondary | ICD-10-CM

## 2021-04-06 MED ORDER — FLUOCINOLONE ACETONIDE 0.01 % OT OIL: TOPICAL_OIL | OTIC | 2 refills | Status: DC

## 2021-04-06 MED ORDER — AMLODIPINE BESYLATE 2.5 MG PO TABS: 2.5000 mg | ORAL_TABLET | Freq: Every day | ORAL | 2 refills | Status: DC

## 2021-04-06 NOTE — Patient Instructions (Addendum)
F/U in 6 to 8 weeks, call if you need me sooner, EKG at visit ° °New for blood pressure is amlodipine 2.5 mg one daily ° °You are referred for mRI of your neck and lower back ° °Yes, please start yoga, reconsider therapy ° °Need covid booster ° °cmp and EGFr and magnesium today, TSH. Lipid, vit D and hBA1C and CBC ° °It is important that you exercise regularly at least 30 minutes 5 times a week. If you develop chest pain, have severe difficulty breathing, or feel very tired, stop exercising immediately and seek medical attention  ° °Thanks for choosing [facility], we consider it a privelige to serve you. ° °

## 2021-04-06 NOTE — Progress Notes (Signed)
[name]     MRN: [id number]      DOB: 1951-03-18   HPI [name] is here for follow up and re-evaluation of chronic medical conditions, medication management and review of any available recent lab and radiology data.  Preventive health is updated, specifically  Cancer screening and Immunization.   Questions or concerns regarding consultations or procedures which the PT has had in the interim are  addressed. 4 month h/o new cramping down both thighs to legs, and heaviness in both arms , left worse than right, aggravated by neck extension at it's worse up to a 10, unable to walk , has lower ext symptoms at least 3 days/ week, also upper ext symptoms, at times left arm moves involuntarily, slides down and pt unable to control Denies loss of sensation or incontinence  ROS Denies recent fever or chills. Denies sinus pressure, nasal congestion, ear pain or sore throat. Denies chest congestion, productive cough or wheezing. Denies chest pains, palpitations and leg swelling Denies abdominal pain, nausea, vomiting,diarrhea or constipation.   Denies dysuria, frequency, hesitancy or incontinence. C/o anxiety and being a chronic worrier Denies skin break down or rash.   PE  BP (!) 166/93    Pulse 74    Ht 5\' 3"  (1.6 m)    Wt 136 lb (61.7 kg)    SpO2 97%    BMI 24.09 kg/m   Patient alert and oriented and in no cardiopulmonary distress.  HEENT: No facial asymmetry, EOMI,     Neck decreased ROM.  Chest: Clear to auscultation bilaterally.  CVS: S1, S2 no murmurs, no S3.Regular rate.  ABD: Soft non tender.   Ext: No edema  MS: Decreased ROM lumbar spine, adequate in shoulders, hips and knees.  Skin: Intact, no ulcerations or rash noted.  Psych: Good eye contact, normal affect. Memory intact not anxious or depressed appearing.  CNS: CN 2-12 intact, power,  normal throughout.no focal deficits noted.   Assessment & Plan  Cervicalgia Increased and uncontrolled neck pain  radiating to upper ext with abnormal movement and sensation, also loss of power intermittently, established disc and bone disease update imaging and refer to neurosurg for re eval  Chronic low back pain with left-sided sciatica 4 month h/o worsening symptoms, has established spine disease, mobility increasingly  Limited update imaging and neurosurg to re eval  Essential hypertension Start dily amlodipine, re eval in next 6 weeeks DASH diet and commitment to daily physical activity for a minimum of 30 minutes discussed and encouraged, as a part of hypertension management. The importance of attaining a healthy weight is also discussed.  BP/Weight 04/06/2021 03/02/2021 01/05/2021 12/01/2020 11/26/2020 7/98/9211 11/29/1738  Systolic BP 814 481 - 856 314 970 263  Diastolic BP 85 80 - 80 82 78 70  Wt. (Lbs) 136 136.2 140 140.2 142.4 145.4 144  BMI 24.09 24.13 24.8 24.84 25.23 26.59 26.34       Prediabetes Patient educated about the importance of limiting  Carbohydrate intake , the need to commit to daily physical activity for a minimum of 30 minutes , and to commit weight loss. The fact that changes in all these areas will reduce or eliminate all together the development of diabetes is stressed.   Diabetic Labs Latest Ref Rng & Units 04/07/2021 11/28/2019 10/13/2019 10/01/2019 07/24/2019  HbA1c 4.8 - 5.6 % 6.1(H) - - 5.9(A) -  Chol 100 - 199 mg/dL 204(H) - 163 - -  HDL >39 mg/dL 47 - 33(L) - -  Calc LDL 0 - 99 mg/dL 134(H) - 100(H) - -  Triglycerides 0 - 149 mg/dL 127 - 179(H) - -  Creatinine 0.57 - 1.00 mg/dL 1.24(H) 1.35(H) - - 1.23(H)   BP/Weight 04/06/2021 03/02/2021 01/05/2021 12/01/2020 11/26/2020 07/02/6806 10/25/1029  Systolic BP 594 585 - 929 244 628 638  Diastolic BP 85 80 - 80 82 78 70  Wt. (Lbs) 136 136.2 140 140.2 142.4 145.4 144  BMI 24.09 24.13 24.8 24.84 25.23 26.59 26.34   No flowsheet data found.  Deteriorated needs to lower carb intake  Hyperlipidemia Hyperlipidemia:Low fat diet  discussed and encouraged.   Lipid Panel  Lab Results  Component Value Date   CHOL 204 (H) 04/07/2021   HDL 47 04/07/2021   LDLCALC 134 (H) 04/07/2021   TRIG 127 04/07/2021   CHOLHDL 4.3 04/07/2021   Needs to reduce fat intake    Seborrheic dermatitis of scalp Good response to shampoo, requests topical oil also, advised limited use

## 2021-04-07 ENCOUNTER — Other Ambulatory Visit: Payer: Self-pay

## 2021-04-07 ENCOUNTER — Telehealth: Payer: Self-pay | Admitting: Family Medicine

## 2021-04-07 DIAGNOSIS — E559 Vitamin D deficiency, unspecified: Secondary | ICD-10-CM | POA: Diagnosis not present

## 2021-04-07 DIAGNOSIS — R030 Elevated blood-pressure reading, without diagnosis of hypertension: Secondary | ICD-10-CM | POA: Diagnosis not present

## 2021-04-07 DIAGNOSIS — R7303 Prediabetes: Secondary | ICD-10-CM | POA: Diagnosis not present

## 2021-04-07 DIAGNOSIS — E782 Mixed hyperlipidemia: Secondary | ICD-10-CM | POA: Diagnosis not present

## 2021-04-07 MED ORDER — ALENDRONATE SODIUM 70 MG PO TABS: ORAL_TABLET | ORAL | 1 refills | Status: DC

## 2021-04-07 NOTE — Telephone Encounter (Signed)
Refills sent

## 2021-04-07 NOTE — Telephone Encounter (Signed)
Pt needs refill on  ALENDRONATE SODIUM TABLETS 70 MG

## 2021-04-08 LAB — LIPID PANEL
Chol/HDL Ratio: 4.3 ratio (ref 0.0–4.4)
Cholesterol, Total: 204 mg/dL — ABNORMAL HIGH (ref 100–199)
HDL: 47 mg/dL (ref >39)
LDL Chol Calc (NIH): 134 mg/dL — ABNORMAL HIGH (ref 0–99)
Triglycerides: 127 mg/dL (ref 0–149)
VLDL Cholesterol Cal: 23 mg/dL (ref 5–40)

## 2021-04-08 LAB — CMP14+EGFR
ALT: 27 IU/L (ref 0–32)
AST: 34 IU/L (ref 0–40)
Albumin/Globulin Ratio: 1.3 (ref 1.2–2.2)
Albumin: 4 g/dL (ref 3.8–4.8)
Alkaline Phosphatase: 43 IU/L — ABNORMAL LOW (ref 44–121)
BUN/Creatinine Ratio: 14 (ref 12–28)
BUN: 17 mg/dL (ref 8–27)
Bilirubin Total: 0.4 mg/dL (ref 0.0–1.2)
CO2: 22 mmol/L (ref 20–29)
Calcium: 9 mg/dL (ref 8.7–10.3)
Chloride: 101 mmol/L (ref 96–106)
Creatinine, Ser: 1.24 mg/dL — ABNORMAL HIGH (ref 0.57–1.00)
Globulin, Total: 3.1 g/dL (ref 1.5–4.5)
Glucose: 89 mg/dL (ref 70–99)
Potassium: 4.6 mmol/L (ref 3.5–5.2)
Sodium: 137 mmol/L (ref 134–144)
Total Protein: 7.1 g/dL (ref 6.0–8.5)
eGFR: 47 mL/min/{1.73_m2} — ABNORMAL LOW (ref >59)

## 2021-04-08 LAB — CBC
Hematocrit: 41.1 % (ref 34.0–46.6)
Hemoglobin: 13.3 g/dL (ref 11.1–15.9)
MCH: 28.2 pg (ref 26.6–33.0)
MCHC: 32.4 g/dL (ref 31.5–35.7)
MCV: 87 fL (ref 79–97)
Platelets: 163 10*3/uL (ref 150–450)
RBC: 4.72 x10E6/uL (ref 3.77–5.28)
RDW: 13.8 % (ref 11.7–15.4)
WBC: 7.1 10*3/uL (ref 3.4–10.8)

## 2021-04-08 LAB — HEMOGLOBIN A1C
Est. average glucose Bld gHb Est-mCnc: 128 mg/dL
Hgb A1c MFr Bld: 6.1 % — ABNORMAL HIGH (ref 4.8–5.6)

## 2021-04-08 LAB — MAGNESIUM: Magnesium: 2 mg/dL (ref 1.6–2.3)

## 2021-04-08 LAB — TSH: TSH: 1.08 u[IU]/mL (ref 0.450–4.500)

## 2021-04-08 LAB — VITAMIN D 25 HYDROXY (VIT D DEFICIENCY, FRACTURES): Vit D, 25-Hydroxy: 44.7 ng/mL (ref 30.0–100.0)

## 2021-04-11 ENCOUNTER — Encounter: Payer: Self-pay | Admitting: Family Medicine

## 2021-04-11 DIAGNOSIS — G8929 Other chronic pain: Secondary | ICD-10-CM | POA: Insufficient documentation

## 2021-04-11 NOTE — Assessment & Plan Note (Signed)
Good response to shampoo, requests topical oil also, advised limited use

## 2021-04-11 NOTE — Assessment & Plan Note (Signed)
4 month h/o worsening symptoms, has established spine disease, mobility increasingly  Limited update imaging and neurosurg to re eval

## 2021-04-11 NOTE — Assessment & Plan Note (Signed)
Patient educated about the importance of limiting  Carbohydrate intake , the need to commit to daily physical activity for a minimum of 30 minutes , and to commit weight loss. The fact that changes in all these areas will reduce or eliminate all together the development of diabetes is stressed.   Diabetic Labs Latest Ref Rng & Units 04/07/2021 11/28/2019 10/13/2019 10/01/2019 07/24/2019  HbA1c 4.8 - 5.6 % 6.1(H) - - 5.9(A) -  Chol 100 - 199 mg/dL 204(H) - 163 - -  HDL >39 mg/dL 47 - 33(L) - -  Calc LDL 0 - 99 mg/dL 134(H) - 100(H) - -  Triglycerides 0 - 149 mg/dL 127 - 179(H) - -  Creatinine 0.57 - 1.00 mg/dL 1.24(H) 1.35(H) - - 1.23(H)   BP/Weight 04/06/2021 03/02/2021 01/05/2021 12/01/2020 11/26/2020 6/95/0722 07/31/5049  Systolic BP 833 582 - 518 984 210 312  Diastolic BP 85 80 - 80 82 78 70  Wt. (Lbs) 136 136.2 140 140.2 142.4 145.4 144  BMI 24.09 24.13 24.8 24.84 25.23 26.59 26.34   No flowsheet data found.  Deteriorated needs to lower carb intake

## 2021-04-11 NOTE — Assessment & Plan Note (Signed)
Hyperlipidemia:Low fat diet discussed and encouraged.   Lipid Panel  Lab Results  Component Value Date   CHOL 204 (H) 04/07/2021   HDL 47 04/07/2021   LDLCALC 134 (H) 04/07/2021   TRIG 127 04/07/2021   CHOLHDL 4.3 04/07/2021   Needs to reduce fat intake

## 2021-04-11 NOTE — Assessment & Plan Note (Signed)
Start dily amlodipine, re eval in next 6 weeeks DASH diet and commitment to daily physical activity for a minimum of 30 minutes discussed and encouraged, as a part of hypertension management. The importance of attaining a healthy weight is also discussed.  BP/Weight 04/06/2021 03/02/2021 01/05/2021 12/01/2020 11/26/2020 1/66/0630 04/02/107  Systolic BP 323 557 - 322 025 427 062  Diastolic BP 85 80 - 80 82 78 70  Wt. (Lbs) 136 136.2 140 140.2 142.4 145.4 144  BMI 24.09 24.13 24.8 24.84 25.23 26.59 26.34

## 2021-04-11 NOTE — Assessment & Plan Note (Signed)
Increased and uncontrolled neck pain radiating to upper ext with abnormal movement and sensation, also loss of power intermittently, established disc and bone disease update imaging and refer to neurosurg for re eval

## 2021-04-12 ENCOUNTER — Telehealth: Payer: Self-pay

## 2021-04-12 ENCOUNTER — Other Ambulatory Visit: Payer: Self-pay

## 2021-04-12 MED ORDER — FLUOCINOLONE ACETONIDE SCALP 0.01 % EX OIL: TOPICAL_OIL | CUTANEOUS | 0 refills | Status: DC

## 2021-04-12 NOTE — Telephone Encounter (Signed)
Corrected and resent to [name].

## 2021-04-12 NOTE — Telephone Encounter (Signed)
Todd from [facility] called the prescription that was sent in for ear oil. Clarification does this go in the ear or on the scalp only. Call back # [phone]

## 2021-04-14 ENCOUNTER — Ambulatory Visit (HOSPITAL_COMMUNITY): Payer: Medicare PPO

## 2021-04-25 DIAGNOSIS — N1831 Chronic kidney disease, stage 3a: Secondary | ICD-10-CM | POA: Diagnosis not present

## 2021-04-25 DIAGNOSIS — M3214 Glomerular disease in systemic lupus erythematosus: Secondary | ICD-10-CM | POA: Diagnosis not present

## 2021-04-25 DIAGNOSIS — D638 Anemia in other chronic diseases classified elsewhere: Secondary | ICD-10-CM | POA: Diagnosis not present

## 2021-04-25 DIAGNOSIS — I129 Hypertensive chronic kidney disease with stage 1 through stage 4 chronic kidney disease, or unspecified chronic kidney disease: Secondary | ICD-10-CM | POA: Diagnosis not present

## 2021-04-26 ENCOUNTER — Ambulatory Visit (HOSPITAL_COMMUNITY)
Admission: RE | Admit: 2021-04-26 | Discharge: 2021-04-26 | Disposition: A | Discharge status: Home / Self Care | Payer: Medicare PPO | Source: Ambulatory Visit | Attending: Family Medicine | Admitting: Family Medicine

## 2021-04-26 ENCOUNTER — Other Ambulatory Visit: Payer: Self-pay

## 2021-04-26 DIAGNOSIS — M5442 Lumbago with sciatica, left side: Secondary | ICD-10-CM | POA: Diagnosis not present

## 2021-04-26 DIAGNOSIS — M47812 Spondylosis without myelopathy or radiculopathy, cervical region: Secondary | ICD-10-CM | POA: Diagnosis not present

## 2021-04-26 DIAGNOSIS — M542 Cervicalgia: Secondary | ICD-10-CM | POA: Diagnosis not present

## 2021-04-26 DIAGNOSIS — G8929 Other chronic pain: Secondary | ICD-10-CM | POA: Diagnosis not present

## 2021-04-26 DIAGNOSIS — M2578 Osteophyte, vertebrae: Secondary | ICD-10-CM | POA: Diagnosis not present

## 2021-04-26 DIAGNOSIS — M545 Low back pain, unspecified: Secondary | ICD-10-CM | POA: Diagnosis not present

## 2021-04-27 DIAGNOSIS — M3214 Glomerular disease in systemic lupus erythematosus: Secondary | ICD-10-CM | POA: Diagnosis not present

## 2021-04-27 DIAGNOSIS — I129 Hypertensive chronic kidney disease with stage 1 through stage 4 chronic kidney disease, or unspecified chronic kidney disease: Secondary | ICD-10-CM | POA: Diagnosis not present

## 2021-04-27 DIAGNOSIS — R7303 Prediabetes: Secondary | ICD-10-CM | POA: Diagnosis not present

## 2021-04-27 DIAGNOSIS — N1831 Chronic kidney disease, stage 3a: Secondary | ICD-10-CM | POA: Diagnosis not present

## 2021-05-02 ENCOUNTER — Encounter: Payer: Self-pay | Admitting: Rheumatology

## 2021-05-02 DIAGNOSIS — M3210 Systemic lupus erythematosus, organ or system involvement unspecified: Secondary | ICD-10-CM | POA: Diagnosis not present

## 2021-05-02 DIAGNOSIS — Z79899 Other long term (current) drug therapy: Secondary | ICD-10-CM | POA: Diagnosis not present

## 2021-05-11 DIAGNOSIS — M3210 Systemic lupus erythematosus, organ or system involvement unspecified: Secondary | ICD-10-CM | POA: Diagnosis not present

## 2021-05-11 DIAGNOSIS — H3550 Unspecified hereditary retinal dystrophy: Secondary | ICD-10-CM | POA: Diagnosis not present

## 2021-05-11 DIAGNOSIS — H524 Presbyopia: Secondary | ICD-10-CM | POA: Diagnosis not present

## 2021-05-11 DIAGNOSIS — H2513 Age-related nuclear cataract, bilateral: Secondary | ICD-10-CM | POA: Diagnosis not present

## 2021-05-11 DIAGNOSIS — Z79899 Other long term (current) drug therapy: Secondary | ICD-10-CM | POA: Diagnosis not present

## 2021-05-25 ENCOUNTER — Ambulatory Visit: Payer: Medicare PPO | Admitting: Family Medicine

## 2021-06-07 ENCOUNTER — Encounter: Payer: Self-pay | Admitting: Family Medicine

## 2021-06-07 ENCOUNTER — Other Ambulatory Visit: Payer: Self-pay

## 2021-06-07 ENCOUNTER — Ambulatory Visit: Payer: Medicare PPO | Admitting: Family Medicine

## 2021-06-07 VITALS — BP 122/78 | HR 66 | Resp 16 | Ht 63.0 in | Wt 130.8 lb

## 2021-06-07 DIAGNOSIS — I10 Essential (primary) hypertension: Secondary | ICD-10-CM | POA: Diagnosis not present

## 2021-06-07 DIAGNOSIS — M47812 Spondylosis without myelopathy or radiculopathy, cervical region: Secondary | ICD-10-CM

## 2021-06-07 DIAGNOSIS — W19XXXA Unspecified fall, initial encounter: Secondary | ICD-10-CM

## 2021-06-07 DIAGNOSIS — D126 Benign neoplasm of colon, unspecified: Secondary | ICD-10-CM

## 2021-06-07 DIAGNOSIS — E782 Mixed hyperlipidemia: Secondary | ICD-10-CM

## 2021-06-07 NOTE — Patient Instructions (Addendum)
F/U in 4 months, call if you need me sooner ? ?If you have any recurrent falls or near falls please let  me know so I can move forwarfd with neurology referral, holding on that for now ? ?You are referred to neurosurgery re abnormal C spine ? ?Blood pressure  is excellent ? ?Review left 5th finger joint pain with your Rheumatologist please ? ?Thanks for choosing [facility], we consider it a privelige to serve you. ? ? ? ?

## 2021-06-07 NOTE — Progress Notes (Signed)
? ?  [name]     MRN: [id number]      DOB: 08-09-1950 ? ? ?HPI ?[name] is here for follow up and re-evaluation of chronic medical conditions, medication management and review of any available recent lab and radiology data.  ?Preventive health is updated, specifically  Cancer screening and Immunization.   ?Reports  3 separate falls in 1 night, she can recall being light headed and then falling , first getting out of the bed ? ?ROS ?Denies recent fever or chills. ?Denies sinus pressure, nasal congestion, ear pain or sore throat. ?Denies chest congestion, productive cough or wheezing. ?Denies chest pains, palpitations and leg swelling ?Denies abdominal pain, nausea, vomiting,diarrhea or constipation.   ?Denies dysuria, frequency, hesitancy or incontinence. ?C/o pain in left 5th finger x several weeks, no trauma to finger ?Denies headaches, seizures, numbness, or tingling. ?Denies depression, anxiety or insomnia. ?Denies skin break down or rash. ? ? ?PE ? ?BP 122/78   Pulse 66   Resp 16   Ht '5\' 3"'$  (1.6 m)   Wt 130 lb 12.8 oz (59.3 kg)   SpO2 96%   BMI 23.17 kg/m?  ? ?Patient alert and oriented and in no cardiopulmonary distress. ? ?HEENT: No facial asymmetry, EOMI,     Neck supple . ? ?Chest: Clear to auscultation bilaterally. ? ?CVS: S1, S2 no murmurs, no S3.Regular rate. ? ?ABD: Soft non tender.  ? ?Ext: No edema ? ?MS: Adequate ROM spine, shoulders, hips and knees. ? ?Skin: Intact, no ulcerations or rash noted. ? ?Psych: Good eye contact, normal affect. Memory intact not anxious or depressed appearing. ? ?CNS: CN 2-12 intact, power,  normal throughout.no focal deficits noted. ? ? ?Assessment & Plan ? ?Essential hypertension ?Controlled, no change in medication ?DASH diet and commitment to daily physical activity for a minimum of 30 minutes discussed and encouraged, as a part of hypertension management. ?The importance of attaining a healthy weight is also discussed. ? ?BP/Weight 06/07/2021 04/06/2021  03/02/2021 01/05/2021 12/01/2020 11/26/2020 10/04/2020  ?Systolic BP 096 283 662 - 152 136 131  ?Diastolic BP 78 85 80 - 80 82 78  ?Wt. (Lbs) 130.8 136 136.2 140 140.2 142.4 145.4  ?BMI 23.17 24.09 24.13 24.8 24.84 25.23 26.59  ? ? ? ? ? ?Falls, initial encounter ?Reports falling 3 times in 1 night with mild confusion, recommend Neurology eval but she is hesitant, will call if recurrs ? ?Hyperlipidemia ?Hyperlipidemia:Low fat diet discussed and encouraged. ? ? ?Lipid Panel  ?Lab Results  ?Component Value Date  ? CHOL 204 (H) 04/07/2021  ? HDL 47 04/07/2021  ? LDLCALC 134 (H) 04/07/2021  ? TRIG 127 04/07/2021  ? CHOLHDL 4.3 04/07/2021  ? ? ? ?Needs to reduce fried and fatty foods ? ?Cervical spondylosis ?Progressive disease with encroachment at multiple levels refer for Neurosurg opinion ? ?

## 2021-06-12 ENCOUNTER — Encounter: Payer: Self-pay | Admitting: Family Medicine

## 2021-06-12 DIAGNOSIS — W19XXXA Unspecified fall, initial encounter: Secondary | ICD-10-CM | POA: Insufficient documentation

## 2021-06-12 DIAGNOSIS — M47812 Spondylosis without myelopathy or radiculopathy, cervical region: Secondary | ICD-10-CM | POA: Insufficient documentation

## 2021-06-12 NOTE — Assessment & Plan Note (Addendum)
Progressive disease with encroachment at multiple levels refer for Neurosurg opinion ?

## 2021-06-12 NOTE — Assessment & Plan Note (Signed)
Hyperlipidemia:Low fat diet discussed and encouraged. ? ? ?Lipid Panel  ?Lab Results  ?Component Value Date  ? CHOL 204 (H) 04/07/2021  ? HDL 47 04/07/2021  ? LDLCALC 134 (H) 04/07/2021  ? TRIG 127 04/07/2021  ? CHOLHDL 4.3 04/07/2021  ? ? ? ?Needs to reduce fried and fatty foods ?

## 2021-06-12 NOTE — Assessment & Plan Note (Signed)
Controlled, no change in medication ?DASH diet and commitment to daily physical activity for a minimum of 30 minutes discussed and encouraged, as a part of hypertension management. ?The importance of attaining a healthy weight is also discussed. ? ?BP/Weight 06/07/2021 04/06/2021 03/02/2021 01/05/2021 12/01/2020 11/26/2020 10/04/2020  ?Systolic BP 514 604 799 - 152 136 131  ?Diastolic BP 78 85 80 - 80 82 78  ?Wt. (Lbs) 130.8 136 136.2 140 140.2 142.4 145.4  ?BMI 23.17 24.09 24.13 24.8 24.84 25.23 26.59  ? ? ? ? ?

## 2021-06-12 NOTE — Assessment & Plan Note (Signed)
Reports falling 3 times in 1 night with mild confusion, recommend Neurology eval but she is hesitant, will call if recurrs ?

## 2021-06-21 ENCOUNTER — Other Ambulatory Visit: Payer: Self-pay | Admitting: Rheumatology

## 2021-06-21 DIAGNOSIS — M3219 Other organ or system involvement in systemic lupus erythematosus: Secondary | ICD-10-CM

## 2021-06-21 NOTE — Telephone Encounter (Signed)
Next Visit: 08/03/2021 ?  ?Last Visit: 03/02/2021 ?  ?Labs: per office note on 03/02/2021: patient had recent labs done by Dr. Verne Spurr which were stable. ?  ?Eye exam: normal on 05/02/2021  ?  ?Current Dose per office note on 03/02/2021: Plaquenil 200 mg 1 tablet by mouth twice daily Monday through Friday. ?  ?DX: Other systemic lupus erythematosus with other organ involvement ?  ?Last Fill: 03/18/2021 ? ?Okay to refill Plaquenil?  ?

## 2021-07-13 ENCOUNTER — Other Ambulatory Visit: Payer: Self-pay | Admitting: Family Medicine

## 2021-07-21 NOTE — Progress Notes (Signed)
? ?Office Visit Note ? ?Patient: [name]             ?Date of Birth: 1950-11-26           ?MRN: [id number]             ?PCP: [name], MD ?Referring: [name], MD ?Visit Date: 08/03/2021 ?Occupation: '@GUAROCC'$ @ ? ?Subjective:  ?Increased joint pain and stiffness ? ?History of Present Illness: [name] is a 71 y.o. female with history of systemic lupus erythematous and osteoporosis. She remains on Plaquenil 200 mg 1 tablet by mouth twice daily Monday through Friday.  She is tolerating Plaquenil without any side effects and has not missed any doses recently.  She presents today with ongoing pain and stiffness involving multiple joints especially the left knee.  She has also had some increased stiffness in both ankle joints.  Her symptoms are most severe after sitting for prolonged periods of time.  She has tried using veterinary liniment gel topically for relief.  She avoids the use of NSAIDs due to CKD.  She states that she initially noticed improvement in her left knee joint pain after having a cortisone injection on 03/02/2021 but the effusion has returned.  She is not experiencing any mechanical symptoms at this time.  She denies any other joint pain or joint swelling at this time.  She denies any other signs or symptoms of a lupus flare.  She has not had any sores in her mouth or nose.  She denies any recent rashes, increased photosensitivity, Raynaud's phenomenon.  She has been avoiding direct sun exposure and wears a hat if she knows she will be outdoors.  She denies any progression of alopecia.  She continues to have chronic dry mouth and uses Biotene products.   ?She continues to follow-up on a regular basis with her nephrologist Dr. Theador Hawthorne.  She had updated lab work on 07/29/21.  ? ?Activities of Daily Living:  ?Patient reports morning stiffness for  a few hours.   ?Patient Denies nocturnal pain.  ?Difficulty dressing/grooming: Denies ?Difficulty climbing stairs:  Denies ?Difficulty getting out of chair: Denies ?Difficulty using hands for taps, buttons, cutlery, and/or writing: Denies ? ?Review of Systems  ?Constitutional:  Negative for fatigue.  ?HENT:  Positive for mouth dryness. Negative for mouth sores and nose dryness.   ?Eyes:  Positive for dryness. Negative for pain and itching.  ?Respiratory:  Negative for shortness of breath and difficulty breathing.   ?Cardiovascular:  Negative for chest pain and palpitations.  ?Gastrointestinal:  Positive for constipation. Negative for blood in stool and diarrhea.  ?Endocrine: Negative for increased urination.  ?Genitourinary:  Negative for difficulty urinating.  ?Musculoskeletal:  Positive for joint pain, joint pain, joint swelling and morning stiffness. Negative for myalgias, muscle tenderness and myalgias.  ?Skin:  Negative for color change, rash and redness.  ?Allergic/Immunologic: Negative for susceptible to infections.  ?Neurological:  Negative for dizziness, numbness, headaches, memory loss and weakness.  ?Hematological:  Negative for bruising/bleeding tendency.  ?Psychiatric/Behavioral:  Negative for confusion.   ? ?PMFS History:  ?Patient Active Problem List  ? Diagnosis Date Noted  ? Falls, initial encounter 06/12/2021  ? Cervical spondylosis 06/12/2021  ? Chronic low back pain with left-sided sciatica 04/11/2021  ? Baker's cyst of knee, left 11/26/2020  ? Seborrheic dermatitis of scalp 07/19/2020  ? Essential hypertension 07/19/2020  ? Overweight (BMI 25.0-29.9) 07/19/2020  ? Systemic lupus erythematosus arthritis ([place]) 02/12/2020  ? Allergic sinusitis 11/04/2018  ?  Hyperlipidemia 03/08/2017  ? Proteinuria 03/08/2017  ? SLE glomerulonephritis syndrome ([place]) 09/15/2016  ? High risk medication use 09/15/2016  ? Degeneration of lumbar intervertebral disc 09/15/2016  ? Personal history of other endocrine, nutritional and metabolic disease 51/04/5850  ? History of anemia 09/15/2016  ? History of proteinuria  09/15/2016  ?  Tubular adenoma of colon 04/30/2016  ? Prediabetes 11/15/2014  ? Cervicalgia 05/11/2014  ? Osteopenia 07/21/2013  ? Anemia 12/22/2012  ? Systemic lupus erythematosus ([place]) 02/15/2012  ? H/O: gastrointestinal disease 10/01/2007  ?  ?Past Medical History:  ?Diagnosis Date  ? Arthritis 02/13/10  ? abnormal MRI lumbar and c spine  with disc disease  ? Osteoporosis   ? SLE (systemic lupus erythematosus) ([place]) dx apprx 1990  ? reports immunotherapy, cytoxan and steroids at [facility], no f/u for over 44yr  ?  ?Family History  ?Problem Relation Age of Onset  ? Heart disease Father   ? Cancer Brother [phone] ?     breast  ? Breast cancer Brother   ? Cancer Sister 550 ?     breast  ? Breast cancer Sister   ? Colon cancer Neg Hx   ? ?Past Surgical History:  ?Procedure Laterality Date  ? ABDOMINAL HYSTERECTOMY  unsure, over 10 yrs  ? partial, has had abnormal pap  ? BLADDER REPAIR    ? COLONOSCOPY N/A 01/05/2014  ? Procedure: COLONOSCOPY;  Surgeon: RDaneil Dolin MD;  [facility];  Service: Endoscopy;  Laterality: N/A;  12:15 PM  ? COLONOSCOPY N/A 07/07/2020  ? Procedure: COLONOSCOPY;  Surgeon: RDaneil Dolin MD;  [facility];  Service: Endoscopy;  Laterality: N/A;  ASA II / AM procedure  ? ?Social History  ? ?Social History Narrative  ? 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 ?Immunization History  ?Administered Date(s) Administered  ? Fluad Quad(high Dose 65+) 11/18/2018, 01/05/2020, 11/26/2020  ? Influenza Split 02/14/2012  ? Influenza Whole 12/28/2000  ? Influenza,inj,Quad PF,6+ Mos 12/16/2012, 01/06/2014, 01/19/2016, 12/10/2017  ? Moderna Sars-Covid-2 Vaccination 05/02/2019, 05/31/2019, 01/29/2020, 10/04/2020  ? Pneumococcal Conjugate-13 09/25/2017  ? Pneumococcal Polysaccharide-23 09/30/2018  ? Td 09/17/2003  ? Tdap 01/06/2014  ? Zoster, Live 12/17/2012  ?  ? ?Objective: ?Vital Signs: BP (!) 154/82 (BP Location: Left Arm, Patient Position: Sitting, Cuff Size: Normal)   Pulse (!) 59    Ht '5\' 3"'$  (1.6 m)   Wt 130 lb 6.4 oz (59.1 kg)   BMI 23.10 kg/m?   ? ?Physical Exam ?Vitals and nursing note reviewed.  ?Constitutional:   ?   Appearance: She is well-developed.  ?HENT:  ?   Head: Normocephalic and atraumatic.  ?Eyes:  ?   Conjunctiva/sclera: Conjunctivae normal.  ?Cardiovascular:  ?   Rate and Rhythm: Normal rate and regular rhythm.  ?   Heart sounds: Normal heart sounds.  ?Pulmonary:  ?   Effort: Pulmonary effort is normal.  ?   Breath sounds: Normal breath sounds.  ?Abdominal:  ?   General: Bowel sounds are normal.  ?   Palpations: Abdomen is soft.  ?Musculoskeletal:  ?   Cervical back: Normal range of motion.  ?Skin: ?   General: Skin is warm and dry.  ?   Capillary Refill: Capillary refill takes less than 2 seconds.  ?Neurological:  ?   Mental Status: She is alert and oriented to person, place, and time.  ?Psychiatric:     ?   Behavior: Behavior normal.  ?  ? ?  Musculoskeletal Exam: C-spine, thoracic spine, and lumbar spine have good range of motion.  No midline spinal tenderness.  Shoulder joints, elbow joints, wrist joints have good ROM.  Complete fist formation bilaterally.  Tenderness and synovitis over the left fifth MCP joint noted.  Hip joints have good range of motion with no groin pain.  Right knee has good range of motion with no warmth or effusion.  Left knee joint has warmth and a small effusion.  Both ankle joints have good range of motion with some joint tenderness bilaterally. ? ?CDAI Exam: ?CDAI Score: -- ?Patient Global: --; Provider Global: -- ?Swollen: 2 ; Tender: 2  ?Joint Exam 08/03/2021  ? ?   Right  Left  ?MCP 5     Swollen Tender  ?Knee     Swollen Tender  ? ? ? ?Investigation: ?No additional findings. ? ?Imaging: ?No results found. ? ?Recent Labs: ?Lab Results  ?Component Value Date  ? WBC 7.1 04/07/2021  ? HGB 13.3 04/07/2021  ? PLT 163 04/07/2021  ? NA 137 04/07/2021  ? K 4.6 04/07/2021  ? CL 101 04/07/2021  ? CO2 22 04/07/2021  ? GLUCOSE 89 04/07/2021  ? BUN 17  04/07/2021  ? CREATININE 1.24 (H) 04/07/2021  ? BILITOT 0.4 04/07/2021  ? ALKPHOS 43 (L) 04/07/2021  ? AST 34 04/07/2021  ? ALT 27 04/07/2021  ? PROT 7.1 04/07/2021  ? ALBUMIN 4.0 04/07/2021  ? CALCIUM 9.0 04/07/2021  ? GFRAA

## 2021-07-29 DIAGNOSIS — M3214 Glomerular disease in systemic lupus erythematosus: Secondary | ICD-10-CM | POA: Diagnosis not present

## 2021-07-29 DIAGNOSIS — R7303 Prediabetes: Secondary | ICD-10-CM | POA: Diagnosis not present

## 2021-07-29 DIAGNOSIS — N1831 Chronic kidney disease, stage 3a: Secondary | ICD-10-CM | POA: Diagnosis not present

## 2021-07-29 DIAGNOSIS — I129 Hypertensive chronic kidney disease with stage 1 through stage 4 chronic kidney disease, or unspecified chronic kidney disease: Secondary | ICD-10-CM | POA: Diagnosis not present

## 2021-07-29 DIAGNOSIS — D631 Anemia in chronic kidney disease: Secondary | ICD-10-CM | POA: Diagnosis not present

## 2021-08-03 ENCOUNTER — Ambulatory Visit: Payer: Medicare PPO | Admitting: Physician Assistant

## 2021-08-03 ENCOUNTER — Other Ambulatory Visit: Payer: Self-pay

## 2021-08-03 ENCOUNTER — Encounter: Payer: Self-pay | Admitting: Physician Assistant

## 2021-08-03 VITALS — BP 154/82 | HR 59 | Ht 63.0 in | Wt 130.4 lb

## 2021-08-03 DIAGNOSIS — M5136 Other intervertebral disc degeneration, lumbar region: Secondary | ICD-10-CM

## 2021-08-03 DIAGNOSIS — M3214 Glomerular disease in systemic lupus erythematosus: Secondary | ICD-10-CM | POA: Diagnosis not present

## 2021-08-03 DIAGNOSIS — Z8639 Personal history of other endocrine, nutritional and metabolic disease: Secondary | ICD-10-CM

## 2021-08-03 DIAGNOSIS — Z1159 Encounter for screening for other viral diseases: Secondary | ICD-10-CM | POA: Diagnosis not present

## 2021-08-03 DIAGNOSIS — M3219 Other organ or system involvement in systemic lupus erythematosus: Secondary | ICD-10-CM

## 2021-08-03 DIAGNOSIS — Z111 Encounter for screening for respiratory tuberculosis: Secondary | ICD-10-CM

## 2021-08-03 DIAGNOSIS — Z87448 Personal history of other diseases of urinary system: Secondary | ICD-10-CM | POA: Diagnosis not present

## 2021-08-03 DIAGNOSIS — Z87898 Personal history of other specified conditions: Secondary | ICD-10-CM

## 2021-08-03 DIAGNOSIS — M25462 Effusion, left knee: Secondary | ICD-10-CM | POA: Diagnosis not present

## 2021-08-03 DIAGNOSIS — M810 Age-related osteoporosis without current pathological fracture: Secondary | ICD-10-CM

## 2021-08-03 DIAGNOSIS — Z8719 Personal history of other diseases of the digestive system: Secondary | ICD-10-CM

## 2021-08-03 DIAGNOSIS — Z862 Personal history of diseases of the blood and blood-forming organs and certain disorders involving the immune mechanism: Secondary | ICD-10-CM

## 2021-08-03 DIAGNOSIS — Z86010 Personal history of colonic polyps: Secondary | ICD-10-CM

## 2021-08-03 DIAGNOSIS — Z79899 Other long term (current) drug therapy: Secondary | ICD-10-CM

## 2021-08-03 NOTE — Telephone Encounter (Signed)
Pending lab results, patient will be starting Imuran per Hazel Sams, PA-C. Thanks!  ?

## 2021-08-03 NOTE — Patient Instructions (Addendum)
Standing Labs ?We placed an order today for your standing lab work.  ? ?Please have your standing labs drawn in 2 weeks, 2 months, then every 3 months  ? ?If possible, please have your labs drawn 2 weeks prior to your appointment so that the provider can discuss your results at your appointment. ? ?Please note that you may see your imaging and lab results in [place] before we have reviewed them. ?We may be awaiting multiple results to interpret others before contacting you. ?Please allow our office up to 72 hours to thoroughly review all of the results before contacting the office for clarification of your results. ? ?We have open lab daily: ?Monday through Thursday from 1:30-4:30 PM and Friday from 1:30-4:00 PM ?at the office of Dr. Bo Merino, [facility].   ?Please be advised, all patients with office appointments requiring lab work will take precedent over walk-in lab work.  ?If possible, please come for your lab work on Monday and Friday afternoons, as you may experience shorter wait times. ?The office is located at [address] [place], [place], [place] [address] ?No appointment is necessary.   ?Labs are drawn by [facility]. Please bring your co-pay at the time of your lab draw.  You may receive a bill from [facility] for your lab work. ? ?Please note if you are on Hydroxychloroquine and and an order has been placed for a Hydroxychloroquine level, you will need to have it drawn 4 hours or more after your last dose. ? ?If you wish to have your labs drawn at another location, please call the office 24 hours in advance to send orders. ? ?If you have any questions regarding directions or hours of operation,  ?please call [phone].   ?As a reminder, please drink plenty of water prior to coming for your lab work. Thanks! ? ?Azathioprine tablets ?What is this medication? ?AZATHIOPRINE (ay za THYE oh preen) suppresses the immune system. It is used to prevent organ rejection after a transplant. It is  also used to treat rheumatoid arthritis. ?This medicine may be used for other purposes; ask your health care provider or pharmacist if you have questions. ?COMMON BRAND NAME(S): Azasan, Imuran ?What should I tell my care team before I take this medication? ?They need to know if you have any of these conditions: ?infection ?kidney disease ?liver disease ?an unusual or allergic reaction to azathioprine, other medicines, lactose, foods, dyes, or preservatives ?pregnant or trying to get pregnant ?breast feeding ?How should I use this medication? ?Take this medicine by mouth with a full glass of water. Follow the directions on the prescription label. Take your medicine at regular intervals. Do not take your medicine more often than directed. Continue to take your medicine even if you feel better. Do not stop taking except on your doctor's advice. ?Talk to your pediatrician regarding the use of this medicine in children. Special care may be needed. ?Overdosage: If you think you have taken too much of this medicine contact a poison control center or emergency room at once. ?NOTE: This medicine is only for you. Do not share this medicine with others. ?What if I miss a dose? ?If you miss a dose, take it as soon as you can. If it is almost time for your next dose, take only that dose. Do not take double or extra doses. ?What may interact with this medication? ?Do not take this medicine with any of the following medications: ?febuxostat ?mercaptopurine ?This medicine may also interact with  the following medications: ?allopurinol ?aminosalicylates like sulfasalazine, mesalamine, balsalazide, and olsalazine ?leflunomide ?medicines called ACE inhibitors like benazepril, captopril, enalapril, fosinopril, quinapril, lisinopril, ramipril, and trandolapril ?mycophenolate ?sulfamethoxazole; trimethoprim ?vaccines ?warfarin ?This list may not describe all possible interactions. Give your health care provider a list of all the  medicines, herbs, non-prescription drugs, or dietary supplements you use. Also tell them if you smoke, drink alcohol, or use illegal drugs. Some items may interact with your medicine. ?What should I watch for while using this medication? ?Visit your doctor or health care professional for regular checks on your progress. You will need frequent blood checks during the first few months you are receiving the medicine. ?If you get a cold or other infection while receiving this medicine, call your doctor or health care professional. Do not treat yourself. The medicine may increase your risk of getting an infection. ?Women should inform their doctor if they wish to become pregnant or think they might be pregnant. There is a potential for serious side effects to an unborn child. Talk to your health care professional or pharmacist for more information. ?Men may have a reduced sperm count while they are taking this medicine. Talk to your health care professional for more information. ?This medicine may increase your risk of getting certain kinds of cancer. Talk to your doctor about healthy lifestyle choices, important screenings, and your risk. ?What side effects may I notice from receiving this medication? ?Side effects that you should report to your doctor or health care professional as soon as possible: ?allergic reactions like skin rash, itching or hives, swelling of the face, lips, or tongue ?changes in vision ?confusion ?fever, chills, or any other sign of infection ?loss of balance or coordination ?severe stomach pain ?unusual bleeding, bruising ?unusually weak or tired ?vomiting ?yellowing of the eyes or skin ?Side effects that usually do not require medical attention (report to your doctor or health care professional if they continue or are bothersome): ?hair loss ?nausea ?This list may not describe all possible side effects. Call your doctor for medical advice about side effects. You may report side effects to FDA  at 1-800-FDA-1088. ?Where should I keep my medication? ?Keep out of the reach of children. ?Store at room temperature between 15 and 25 degrees C (59 and 77 degrees F). Protect from light. Throw away any unused medicine after the expiration date. ?NOTE: This sheet is a summary. It may not cover all possible information. If you have questions about this medicine, talk to your doctor, pharmacist, or health care provider. ?? 2023 Elsevier/Gold Standard (2013-07-08 00:00:00) ? ?

## 2021-08-04 NOTE — Progress Notes (Signed)
ESR WNL.  ?IgA and IgG are borderline elevated.

## 2021-08-05 NOTE — Progress Notes (Signed)
Hepatitis B and C negative.

## 2021-08-08 NOTE — Progress Notes (Signed)
TB gold negative

## 2021-08-08 NOTE — Progress Notes (Signed)
SPEP did not reveal any abnormal proteins.

## 2021-08-09 ENCOUNTER — Telehealth: Payer: Self-pay | Admitting: *Deleted

## 2021-08-09 NOTE — Telephone Encounter (Signed)
Labs received from:[facility] ? ?Drawn on:07/29/2021 ? ?Reviewed by:Hazel Sams, PA-C ? ?Labs drawn:CMP, Iron/Anemia Profile, PTH, Phosphorus, Vitamin D, CBC, C3 C4, ENA panel, UA ? ?Results:Positive ANA 1:80 Cytoplasmic, Nuclear ? Creat. 1.13 ? GFR 53 ? MCHC 31.9 ? MPV 12.7 ? ?Patient is on PLQ 200 mg BID Monday-Friday. Due to start Imuran once lab results are received.   ?

## 2021-08-10 DIAGNOSIS — N1831 Chronic kidney disease, stage 3a: Secondary | ICD-10-CM | POA: Diagnosis not present

## 2021-08-10 DIAGNOSIS — I129 Hypertensive chronic kidney disease with stage 1 through stage 4 chronic kidney disease, or unspecified chronic kidney disease: Secondary | ICD-10-CM | POA: Diagnosis not present

## 2021-08-10 DIAGNOSIS — R7303 Prediabetes: Secondary | ICD-10-CM | POA: Diagnosis not present

## 2021-08-10 DIAGNOSIS — M3214 Glomerular disease in systemic lupus erythematosus: Secondary | ICD-10-CM | POA: Diagnosis not present

## 2021-08-17 LAB — IGG, IGA, IGM
IgG (Immunoglobin G), Serum: 1681 mg/dL — ABNORMAL HIGH (ref 600–1540)
IgM, Serum: 74 mg/dL (ref 50–300)
Immunoglobulin A: 405 mg/dL — ABNORMAL HIGH (ref 70–320)

## 2021-08-17 LAB — HEPATITIS C ANTIBODY
Hepatitis C Ab: NONREACTIVE
SIGNAL TO CUT-OFF: 0.22 (ref <1.00)

## 2021-08-17 LAB — QUANTIFERON-TB GOLD PLUS
Mitogen-NIL: 0.57 IU/mL
NIL: 0.03 IU/mL
QuantiFERON-TB Gold Plus: NEGATIVE
TB1-NIL: 0.01 IU/mL
TB2-NIL: 0 IU/mL

## 2021-08-17 LAB — HEPATITIS B SURFACE ANTIGEN: Hepatitis B Surface Ag: NONREACTIVE

## 2021-08-17 LAB — PROTEIN ELECTROPHORESIS, SERUM, WITH REFLEX
Albumin ELP: 3.9 g/dL (ref 3.8–4.8)
Alpha 1: 0.3 g/dL (ref 0.2–0.3)
Alpha 2: 0.7 g/dL (ref 0.5–0.9)
Beta 2: 0.5 g/dL (ref 0.2–0.5)
Beta Globulin: 0.5 g/dL (ref 0.4–0.6)
Gamma Globulin: 1.5 g/dL (ref 0.8–1.7)
Total Protein: 7.4 g/dL (ref 6.1–8.1)

## 2021-08-17 LAB — THIOPURINE METHYLTRANSFERASE (TPMT), RBC: Thiopurine Methyltransferase, RBC: 11 nmol/hr/mL RBC — ABNORMAL LOW

## 2021-08-17 LAB — SEDIMENTATION RATE: Sed Rate: 14 mm/h (ref 0–30)

## 2021-08-17 LAB — HEPATITIS B CORE ANTIBODY, IGM: Hep B C IgM: NONREACTIVE

## 2021-08-18 MED ORDER — AZATHIOPRINE 50 MG PO TABS: 50.0000 mg | ORAL_TABLET | Freq: Every day | ORAL | 2 refills | Status: DC

## 2021-08-18 NOTE — Telephone Encounter (Signed)
TMPT is borderline low-considered a low metabolizer. Ok to start imuran 50 mg 1 tablet by mouth daily.  Recheck CBC and CMP in 2 weeks.  She will remain on low dose imuran.

## 2021-08-18 NOTE — [facility] Nurse Program (Signed)
Encounter on 08/13/21 at [place] at [facility] in [place].  Blood pressure screening provided. Reading 133/76 left arm, sitting. [name] has a primary care provider in Dr Moshe Cipro  She reports she is taking medications as prescribed.  Recommendation to continue all follow up appointments with her primary care provider and any specialist she has, to continue to take medications as prescribed and discuss any issues with her medications with her medical provider.    Mayra Reel RN [facility] [employer]

## 2021-08-18 NOTE — Progress Notes (Signed)
TMPT is borderline low-considered a low metabolizer. Ok to start imuran 50 mg 1 tablet by mouth daily.  Recheck CBC and CMP in 2 weeks.  She will remain on low dose imuran.

## 2021-09-09 NOTE — Progress Notes (Unsigned)
Office Visit Note  Patient: [name]             Date of Birth: March 14, 1951           MRN: [id number]             PCP: [name], MD Referring: [name], MD Visit Date: 09/20/2021 Occupation: '@GUAROCC'$ @  Subjective:  No chief complaint on file.   History of Present Illness: [name] is a 71 y.o. female ***   Activities of Daily Living:  Patient reports morning stiffness for *** {minute/hour:19697}.   Patient {ACTIONS;DENIES/REPORTS:21021675::"Denies"} nocturnal pain.  Difficulty dressing/grooming: {ACTIONS;DENIES/REPORTS:21021675::"Denies"} Difficulty climbing stairs: {ACTIONS;DENIES/REPORTS:21021675::"Denies"} Difficulty getting out of chair: {ACTIONS;DENIES/REPORTS:21021675::"Denies"} Difficulty using hands for taps, buttons, cutlery, and/or writing: {ACTIONS;DENIES/REPORTS:21021675::"Denies"}  No Rheumatology ROS completed.   PMFS History:  Patient Active Problem List   Diagnosis Date Noted   Falls, initial encounter 06/12/2021   Cervical spondylosis 06/12/2021   Chronic low back pain with left-sided sciatica 04/11/2021   Baker's cyst of knee, left 11/26/2020   Seborrheic dermatitis of scalp 07/19/2020   Essential hypertension 07/19/2020   Overweight (BMI 25.0-29.9) 07/19/2020   Systemic lupus erythematosus arthritis ([place]) 02/12/2020   Allergic sinusitis 11/04/2018   Hyperlipidemia 03/08/2017   Proteinuria 03/08/2017   SLE glomerulonephritis syndrome ([place]) 09/15/2016   High risk medication use 09/15/2016   Degeneration of lumbar intervertebral disc 09/15/2016   Personal history of other endocrine, nutritional and metabolic disease 34/19/6222   History of anemia 09/15/2016   History of proteinuria  09/15/2016   Tubular adenoma of colon 04/30/2016   Prediabetes 11/15/2014   Cervicalgia 05/11/2014   Osteopenia 07/21/2013   Anemia 12/22/2012   Systemic lupus erythematosus ([place]) 02/15/2012   H/O: gastrointestinal disease 10/01/2007     Past Medical History:  Diagnosis Date   Arthritis 02/13/10   abnormal MRI lumbar and c spine  with disc disease   Osteoporosis    SLE (systemic lupus erythematosus) ([place]) dx apprx 1990   reports immunotherapy, cytoxan and steroids at [facility], no f/u for over 67yr    Family History  Problem Relation Age of Onset   Heart disease Father    Cancer Brother 666      breast   Breast cancer Brother    Cancer Sister [phone]      breast   Breast cancer Sister    Colon cancer Neg Hx    Past Surgical History:  Procedure Laterality Date   ABDOMINAL HYSTERECTOMY  unsure, over 10 yrs   partial, has had abnormal pap   BLADDER REPAIR     COLONOSCOPY N/A 01/05/2014   Procedure: COLONOSCOPY;  Surgeon: RDaneil Dolin MD;  [facility];  Service: Endoscopy;  Laterality: N/A;  12:15 PM   COLONOSCOPY N/A 07/07/2020   Procedure: COLONOSCOPY;  Surgeon: RDaneil Dolin MD;  [facility];  Service: Endoscopy;  Laterality: N/A;  ASA II / AM procedure   Social History   Social History Narrative   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   Physical Exam   Musculoskeletal Exam: ***  CDAI Exam: CDAI Score: -- Patient Global: --; Provider Global: -- Swollen: --; Tender: -- Joint Exam 09/20/2021   No joint exam has been documented for this visit   There is currently no information documented on the homunculus.  Go to the Rheumatology activity and complete the homunculus joint exam.  Investigation: No additional findings.  Imaging: No results found.  Recent Labs: Lab Results  Component Value Date   WBC 7.1 04/07/2021   HGB 13.3 04/07/2021   PLT 163 04/07/2021   NA 137 04/07/2021   K 4.6 04/07/2021   CL 101 04/07/2021   CO2 22 04/07/2021   GLUCOSE 89 04/07/2021   BUN 17 04/07/2021   CREATININE 1.24 (H) 04/07/2021   BILITOT 0.4 04/07/2021   ALKPHOS 43 (L) 04/07/2021   AST 34 04/07/2021   ALT 27 04/07/2021   PROT 7.4 08/03/2021   ALBUMIN 4.0 04/07/2021   CALCIUM 9.0 04/07/2021   GFRAA 47 (L) 11/28/2019   QFTBGOLDPLUS NEGATIVE 08/03/2021    Speciality Comments: Plaquenil eye exam normal on 05/02/2021 per my eye doctor in [facility]  Procedures:  No procedures performed Allergies: Sulfa antibiotics and Sulfonamide derivatives   Assessment / Plan:     Visit Diagnoses: Other systemic lupus erythematosus with other organ involvement ([place])  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  No orders of the defined types were placed in this encounter.   Face-to-face time spent with patient was *** minutes. Greater than 50% of time was spent in counseling and coordination of care.  Follow-Up Instructions: No follow-ups on file.   Ofilia Neas, PA-C  Note - This record has been created using Dragon software.  Chart creation errors have been sought, but may not always  have been located. Such creation errors do not reflect on  the standard of medical  care.

## 2021-09-14 ENCOUNTER — Other Ambulatory Visit: Payer: Self-pay | Admitting: Physician Assistant

## 2021-09-14 DIAGNOSIS — M3219 Other organ or system involvement in systemic lupus erythematosus: Secondary | ICD-10-CM

## 2021-09-14 NOTE — Telephone Encounter (Signed)
Next Visit: 09/20/2021  Last Visit: 08/03/2021  Labs: 07/29/2021 Creat. 1.13  GFR 53  MCHC 31.9  MPV 12.7  Eye exam: 05/02/2021 per my eye doctor in [facility].    Current Dose per office note 08/03/2021: Plaquenil 200 mg 1 tablet by mouth twice daily Monday through Friday.   SE:LTRVU systemic lupus erythematosus with other organ involvement   Last Fill: 06/21/2021  Okay to refill Plaquenil?

## 2021-09-20 ENCOUNTER — Ambulatory Visit: Payer: Medicare PPO | Admitting: Physician Assistant

## 2021-09-20 ENCOUNTER — Encounter: Payer: Self-pay | Admitting: Physician Assistant

## 2021-09-20 VITALS — BP 125/80 | HR 70 | Ht 63.0 in | Wt 130.0 lb

## 2021-09-20 DIAGNOSIS — M25462 Effusion, left knee: Secondary | ICD-10-CM | POA: Diagnosis not present

## 2021-09-20 DIAGNOSIS — M810 Age-related osteoporosis without current pathological fracture: Secondary | ICD-10-CM

## 2021-09-20 DIAGNOSIS — Z8639 Personal history of other endocrine, nutritional and metabolic disease: Secondary | ICD-10-CM | POA: Diagnosis not present

## 2021-09-20 DIAGNOSIS — Z79899 Other long term (current) drug therapy: Secondary | ICD-10-CM | POA: Diagnosis not present

## 2021-09-20 DIAGNOSIS — Z87448 Personal history of other diseases of urinary system: Secondary | ICD-10-CM | POA: Diagnosis not present

## 2021-09-20 DIAGNOSIS — Z8719 Personal history of other diseases of the digestive system: Secondary | ICD-10-CM | POA: Diagnosis not present

## 2021-09-20 DIAGNOSIS — M3219 Other organ or system involvement in systemic lupus erythematosus: Secondary | ICD-10-CM

## 2021-09-20 DIAGNOSIS — Z87898 Personal history of other specified conditions: Secondary | ICD-10-CM

## 2021-09-20 DIAGNOSIS — M5136 Other intervertebral disc degeneration, lumbar region: Secondary | ICD-10-CM

## 2021-09-20 DIAGNOSIS — Z862 Personal history of diseases of the blood and blood-forming organs and certain disorders involving the immune mechanism: Secondary | ICD-10-CM

## 2021-09-20 DIAGNOSIS — M3214 Glomerular disease in systemic lupus erythematosus: Secondary | ICD-10-CM | POA: Diagnosis not present

## 2021-09-20 DIAGNOSIS — Z86010 Personal history of colonic polyps: Secondary | ICD-10-CM

## 2021-09-21 LAB — COMPLETE METABOLIC PANEL WITH GFR
AG Ratio: 1.3 (calc) (ref 1.0–2.5)
ALT: 14 U/L (ref 6–29)
AST: 22 U/L (ref 10–35)
Albumin: 3.9 g/dL (ref 3.6–5.1)
Alkaline phosphatase (APISO): 31 U/L — ABNORMAL LOW (ref 37–153)
BUN/Creatinine Ratio: 16 (calc) (ref 6–22)
BUN: 19 mg/dL (ref 7–25)
CO2: 27 mmol/L (ref 20–32)
Calcium: 9.8 mg/dL (ref 8.6–10.4)
Chloride: 106 mmol/L (ref 98–110)
Creat: 1.17 mg/dL — ABNORMAL HIGH (ref 0.50–1.05)
Globulin: 3.1 g/dL (calc) (ref 1.9–3.7)
Glucose, Bld: 79 mg/dL (ref 65–99)
Potassium: 4.5 mmol/L (ref 3.5–5.3)
Sodium: 141 mmol/L (ref 135–146)
Total Bilirubin: 0.3 mg/dL (ref 0.2–1.2)
Total Protein: 7 g/dL (ref 6.1–8.1)
eGFR: 51 mL/min/{1.73_m2} — ABNORMAL LOW (ref >=60)

## 2021-09-21 LAB — CBC WITH DIFFERENTIAL/PLATELET
Absolute Monocytes: 657 cells/uL (ref 200–950)
Basophils Absolute: 49 cells/uL (ref 0–200)
Basophils Relative: 1.3 %
Eosinophils Absolute: 61 cells/uL (ref 15–500)
Eosinophils Relative: 1.6 %
HCT: 40.4 % (ref 35.0–45.0)
Hemoglobin: 13 g/dL (ref 11.7–15.5)
Lymphs Abs: 889 cells/uL (ref 850–3900)
MCH: 28.4 pg (ref 27.0–33.0)
MCHC: 32.2 g/dL (ref 32.0–36.0)
MCV: 88.2 fL (ref 80.0–100.0)
MPV: 12.6 fL — ABNORMAL HIGH (ref 7.5–12.5)
Monocytes Relative: 17.3 %
Neutro Abs: 2143 cells/uL (ref 1500–7800)
Neutrophils Relative %: 56.4 %
Platelets: 148 10*3/uL (ref 140–400)
RBC: 4.58 10*6/uL (ref 3.80–5.10)
RDW: 13.1 % (ref 11.0–15.0)
Total Lymphocyte: 23.4 %
WBC: 3.8 10*3/uL (ref 3.8–10.8)

## 2021-09-21 NOTE — Progress Notes (Signed)
White blood cell count was within normal limits.  No anemia noted.  Creatinine remains elevated 1.17 and GFR was 51.  Please advise the patient to continue to avoid NSAIDs.  Please forward results to nephrologist as requested.  LFTs were within normal limits.  Continue on current dose of Imuran as prescribed.

## 2021-09-26 ENCOUNTER — Ambulatory Visit
Admission: RE | Admit: 2021-09-26 | Discharge: 2021-09-26 | Disposition: A | Discharge status: Home / Self Care | Payer: Medicare PPO | Source: Ambulatory Visit | Attending: Nurse Practitioner | Admitting: Nurse Practitioner

## 2021-09-26 VITALS — BP 123/81 | HR 65 | Temp 98.0°F | Resp 20

## 2021-09-26 DIAGNOSIS — J029 Acute pharyngitis, unspecified: Secondary | ICD-10-CM

## 2021-09-26 DIAGNOSIS — J309 Allergic rhinitis, unspecified: Secondary | ICD-10-CM | POA: Insufficient documentation

## 2021-09-26 LAB — POCT RAPID STREP A (OFFICE): Rapid Strep A Screen: NEGATIVE

## 2021-09-26 MED ORDER — CETIRIZINE HCL 10 MG PO TABS: 10.0000 mg | ORAL_TABLET | Freq: Every day | ORAL | 0 refills | Status: DC

## 2021-09-26 MED ORDER — PSEUDOEPH-BROMPHEN-DM 30-2-10 MG/5ML PO SYRP: 5.0000 mL | ORAL_SOLUTION | Freq: Four times a day (QID) | ORAL | 0 refills | Status: DC | PRN

## 2021-09-26 NOTE — ED Provider Notes (Signed)
[facility]    CSN: [id number] Arrival date & time: 09/26/21  1249      History   Chief Complaint Chief Complaint  Patient presents with   Dizziness    Not well for past few days.  The back of my throat feels irritated. I also feel lightheaded for short spans of time. - Entered by patient   Chills   Fever   Sore Throat    HPI [name] is a 71 y.o. female.   The history is provided by the patient.   Patient presents for complaints of intermittent dizziness, chills, and nasal congestion.  [name] reports that over the past week, [name] has had fever and sore throat, which is since improved.  [name] continues to have "white bumps" in the back of her throat.  [name] also complains of intermittent dizziness.  Patient denies feeling like [name] is spinning or like the room is spinning, ear pain, coughing, wheezing, shortness of breath, or GI symptoms.  Patient states that in the past [name] has had episodes of dizziness, but it has resolved on its own.  [name] denies any previous history of vertigo.  [name] has not taken any medication for her symptoms.  Past Medical History:  Diagnosis Date   Arthritis 02/13/10   abnormal MRI lumbar and c spine  with disc disease   Osteoporosis    SLE (systemic lupus erythematosus) ([place]) dx apprx 1990   reports immunotherapy, cytoxan and steroids at [facility], no f/u for over 64yr    Patient Active Problem List   Diagnosis Date Noted   Falls, initial encounter 06/12/2021   Cervical spondylosis 06/12/2021   Chronic low back pain with left-sided sciatica 04/11/2021   Baker's cyst of knee, left 11/26/2020   Seborrheic dermatitis of scalp 07/19/2020   Essential hypertension 07/19/2020   Overweight (BMI 25.0-29.9) 07/19/2020   Systemic lupus erythematosus arthritis ([place] 02/12/2020   Allergic sinusitis 11/04/2018   Hyperlipidemia 03/08/2017   Proteinuria 03/08/2017   SLE glomerulonephritis syndrome ([place] 09/15/2016   High risk medication use 09/15/2016    Degeneration of lumbar intervertebral disc 09/15/2016   Personal history of other endocrine, nutritional and metabolic disease 076/19/5093  History of anemia 09/15/2016   History of proteinuria  09/15/2016   Tubular adenoma of colon 04/30/2016   Prediabetes 11/15/2014   Cervicalgia 05/11/2014   Osteopenia 07/21/2013   Anemia 12/22/2012   [place]mat[place] 02/15/2012   H/O: gastrointestinal disease 10/01/2007    Past Surgical History:  Procedure Laterality Date   ABDOMINAL HYSTERECTOMY  unsure, over 10 yrs   partial, has had abnormal pap   BLADDER REPAIR     COLONOSCOPY N/A 01/05/2014   Procedure: COLONOSCOPY;  Surgeon: RDaneil Dolin MD;  [facility];  Service: Endoscopy;  Laterality: N/A;  12:15 PM   COLONOSCOPY N/A 07/07/2020   Procedure: COLONOSCOPY;  Surgeon: RDaneil Dolin MD;  Location: [facility];  Service: Endoscopy;  Laterality: N/A;  ASA II / AM procedure    OB History   No obstetric history on file.      Home Medications    Prior to Admission medications   Medication Sig Start Date End Date Taking? Authorizing Provider  brompheniramine-pseudoephedrine-DM 30-2-10 MG/5ML syrup Take 5 mLs by mouth 4 (four) times daily as needed. 09/26/21  Yes Annalyse Langlais-Warren, CAlda Lea NP  cetirizine (ZYRTEC) 10 MG tablet Take 1 tablet (10 mg total) by mouth daily. 09/26/21  Yes Storey Stangeland-Warren, CAlda Lea NP  alendronate (FOSAMAX) 70 MG tablet Take with a full glass of water on an empty stomach. 04/07/21   [name], MD  amLODipine (NORVASC) 2.5 MG tablet TAKE ONE TABLET (2.'5MG'$  TOTAL) BY MOUTH DAILY 07/13/21   [name], MD  azaTHIOprine (IMURAN) 50 MG tablet Take 1 tablet (50 mg total) by mouth daily. 08/18/21   Ofilia Neas, PA-C  Calcium Carb-Cholecalciferol (CALCIUM 600+D) 600-800 MG-UNIT TABS Take 2 tablets by mouth daily.    [provider]  cholecalciferol (VITAMIN D3) 25 MCG (1000 UNIT) tablet Take 2,000 Units by mouth  daily.    [provider]  Cyanocobalamin (B-12 PO) Take 1 tablet by mouth daily.    [provider]  ELDERBERRY PO Take by mouth.    [provider]  Fluocinolone Acetonide 0.01 % OIL Apply sparingly to scalp 3 times weekly as needed Patient not taking: Reported on 08/03/2021 04/06/21   [name], MD  Fluocinolone Acetonide Scalp 0.01 % OIL Apply sparingly to scalp 3 times weekly as needed 04/12/21   [name], MD  hydroxychloroquine (PLAQUENIL) 200 MG tablet TAKE ONE TABLET BY MOUTH TWICE A DAY MONDAY THROUGH FRIDAY. 09/14/21   Ofilia Neas, PA-C  ipratropium (ATROVENT) 0.06 % nasal spray Place 2 sprays into both nostrils 3 (three) times daily. Patient taking differently: Place 2 sprays into both nostrils as needed for rhinitis. 04/05/20   [name], MD  Multiple Vitamins-Minerals (SYSTANE ICAPS AREDS2) CAPS Take 2 capsules by mouth daily.    [provider]  VITAMIN A PO Take 1 capsule by mouth daily.    [provider]  vitamin C (ASCORBIC ACID) 500 MG tablet Take 1,000 mg by mouth daily.    [provider]    Family History Family History  Problem Relation Age of Onset   Heart disease Father    Cancer Brother 80       breast   Breast cancer Brother    Cancer Sister 61       breast   Breast cancer Sister    Colon cancer Neg Hx     Social History Social History   Tobacco Use   Smoking status: Never    Passive exposure: Never   Smokeless tobacco: Never  Vaping Use   Vaping Use: Never used  Substance Use Topics   Alcohol use: No   Drug use: No     Allergies   Sulfa antibiotics and Sulfonamide derivatives   Review of Systems Review of Systems Per HPI  Physical Exam Triage Vital Signs ED Triage Vitals  Enc Vitals Group     BP 09/26/21 1312 123/81     Pulse Rate 09/26/21 1312 65     Resp 09/26/21 1312 20     Temp 09/26/21 1312 98 F (36.7 C)     Temp src --      SpO2 09/26/21  1312 99 %     Weight --      Height --      Head Circumference --      Peak Flow --      Pain Score 09/26/21 1310 3     Pain Loc --      Pain Edu? --      Excl. in [place]? --    No data found.  Updated Vital Signs BP 123/81   Pulse 65   Temp 98 F (36.7 C)   Resp 20   SpO2 99%  Visual Acuity Right Eye Distance:   Left Eye Distance:   Bilateral Distance:    Right Eye Near:   Left Eye Near:    Bilateral Near:     Physical Exam Vitals and nursing note reviewed.  Constitutional:      General: [name] is not in acute distress.    Appearance: [name] is well-developed.  HENT:     Head: Normocephalic.     Right Ear: Tympanic membrane and ear canal normal.     Left Ear: Tympanic membrane and ear canal normal.     Nose: No congestion.     Right Turbinates: Enlarged and swollen.     Left Turbinates: Enlarged and swollen.     Right Sinus: No maxillary sinus tenderness or frontal sinus tenderness.     Left Sinus: No maxillary sinus tenderness or frontal sinus tenderness.     Mouth/Throat:     Mouth: Mucous membranes are moist.     Pharynx: Uvula midline. Posterior oropharyngeal erythema present. No pharyngeal swelling, oropharyngeal exudate or uvula swelling.     Tonsils: No tonsillar exudate.     Comments: Cobblestoning seen on oropharynx Eyes:     Conjunctiva/sclera: Conjunctivae normal.     Pupils: Pupils are equal, round, and reactive to light.  Cardiovascular:     Rate and Rhythm: Regular rhythm.     Heart sounds: Normal heart sounds.  Pulmonary:     Effort: Pulmonary effort is normal.     Breath sounds: Normal breath sounds.  Abdominal:     General: Bowel sounds are normal.     Palpations: Abdomen is soft.  Skin:    General: Skin is warm and dry.  Neurological:     General: No focal deficit present.     Mental Status: [name] is alert and oriented to person, place, and time.  Psychiatric:        Mood and Affect: Mood normal.        Behavior: Behavior normal.      UC  Treatments / Results  Labs (all labs ordered are listed, but only abnormal results are displayed) Labs Reviewed  CULTURE, GROUP A STREP [facility])  POCT RAPID STREP A (OFFICE)    EKG   Radiology No results found.  Procedures Procedures (including critical care time)  Medications Ordered in UC Medications - No data to display  Initial Impression / Assessment and Plan / UC Course  I have reviewed the triage vital signs and the nursing notes.  Pertinent labs & imaging results that were available during my care of the patient were reviewed by me and considered in my medical decision making (see chart for details).  Patient presents for complaints of dizziness, and upper respiratory symptoms that been present for the past week.  Patient's vital signs are stable.  Rapid strep test is negative, throat culture is pending.  Patient's history and exam are consistent with allergic rhinitis symptoms.  We will treat patient symptomatically with cetirizine and Bromfed at this time.  Discussion with patient regarding her current symptoms and when to follow-up if they do not improve.  Supportive care recommendations were provided to the patient.  Patient advised to follow-up in this clinic or with her primary care physician as needed. Final Clinical Impressions(s) / UC Diagnoses   Final diagnoses:  Allergic rhinitis, unspecified seasonality, unspecified trigger  Sore throat     Discharge Instructions      Take medication as prescribed. Increase fluids and allow for plenty of rest. Recommend  Tylenol as needed for pain, fever, or general discomfort. Warm salt water gargles 3-4 times daily to help with throat pain or discomfort. Recommend using a humidifier at bedtime during sleep to help with cough and nasal congestion. Sleep elevated on 2 pillows to help with cough symptoms. Follow-up with your primary care physician or in this clinic if your symptoms worsen or do not improve.     ED  Prescriptions     Medication Sig Dispense Auth. Provider   cetirizine (ZYRTEC) 10 MG tablet Take 1 tablet (10 mg total) by mouth daily. 30 tablet Pratyush Ammon-Warren, Alda Lea, NP   brompheniramine-pseudoephedrine-DM 30-2-10 MG/5ML syrup Take 5 mLs by mouth 4 (four) times daily as needed. 140 mL Kjuan Seipp-Warren, Alda Lea, NP      PDMP not reviewed this encounter.   Tish Men, NP 09/26/21 1405    Tish Men, NP 09/27/21 [phone]

## 2021-09-26 NOTE — ED Triage Notes (Signed)
Pt presents with c/o feeling unwell with cold chills, and fever last week, also has c/o sore throat with white patches

## 2021-09-26 NOTE — Discharge Instructions (Addendum)
Take medication as prescribed. Increase fluids and allow for plenty of rest. Recommend Tylenol as needed for pain, fever, or general discomfort. Warm salt water gargles 3-4 times daily to help with throat pain or discomfort. Recommend using a humidifier at bedtime during sleep to help with cough and nasal congestion. Sleep elevated on 2 pillows to help with cough symptoms. Follow-up with your primary care physician or in this clinic if your symptoms worsen or do not improve.

## 2021-09-28 ENCOUNTER — Other Ambulatory Visit: Payer: Self-pay | Admitting: Family Medicine

## 2021-09-29 ENCOUNTER — Ambulatory Visit: Payer: Medicare PPO | Admitting: Family Medicine

## 2021-09-29 LAB — CULTURE, GROUP A STREP (THRC)

## 2021-10-11 ENCOUNTER — Encounter: Payer: Self-pay | Admitting: Family Medicine

## 2021-10-11 ENCOUNTER — Ambulatory Visit: Payer: Medicare PPO | Admitting: Family Medicine

## 2021-10-11 VITALS — BP 136/82 | HR 70 | Resp 16 | Ht 63.0 in | Wt 130.0 lb

## 2021-10-11 DIAGNOSIS — E782 Mixed hyperlipidemia: Secondary | ICD-10-CM

## 2021-10-11 DIAGNOSIS — R7303 Prediabetes: Secondary | ICD-10-CM | POA: Diagnosis not present

## 2021-10-11 DIAGNOSIS — Z0000 Encounter for general adult medical examination without abnormal findings: Secondary | ICD-10-CM | POA: Insufficient documentation

## 2021-10-11 MED ORDER — AMLODIPINE BESYLATE 2.5 MG PO TABS: 2.5000 mg | ORAL_TABLET | Freq: Every day | ORAL | 3 refills | Status: DC

## 2021-10-11 MED ORDER — CETIRIZINE HCL 10 MG PO TABS: 10.0000 mg | ORAL_TABLET | Freq: Every day | ORAL | 0 refills | Status: DC

## 2021-10-11 NOTE — Assessment & Plan Note (Signed)

## 2021-10-11 NOTE — Patient Instructions (Signed)
F/U in January, call if you need me sooner  Nurse  please document vision screen  Excellent blood pressure and exam, no med changes  Call for and come for flu vaccine in the [place] today, lipid and HBa1C  Keep active , and follow healthy plant based diet  Thanks for choosing [facility], we consider it a privelige to serve you.

## 2021-10-11 NOTE — Progress Notes (Signed)
    [name]     MRN: [id number]      DOB: Aug 30, 1950  HPI: Patient is in for annual physical exam. No other health concerns are expressed or addressed at the visit. Recent labs,  are reviewed. Immunization is reviewed , and  updated if needed.   PE: BP 136/82   Pulse 70   Resp 16   Ht '5\' 3"'$  (1.6 m)   Wt 130 lb (59 kg)   SpO2 98%   BMI 23.03 kg/m   Pleasant  female, alert and oriented x 3, in no cardio-pulmonary distress. Afebrile. HEENT No facial trauma or asymetry. Sinuses non tender.  Extra occullar muscles intact.. External ears normal, . Neck: supple, no adenopathy,JVD or thyromegaly.No bruits.  Chest: Clear to ascultation bilaterally.No crackles or wheezes. Non tender to palpation  Cardiovascular system; Heart sounds normal,  S1 and  S2 ,no S3.  No murmur, or thrill. Apical beat not displaced Peripheral pulses normal.  Abdomen: Soft, non tender, no organomegaly or masses. No bruits. Bowel sounds normal. No guarding, tenderness or rebound.  .   Musculoskeletal exam: Full ROM of spine, hips , shoulders and knees.  deformity , noted. No muscle wasting or atrophy.   Neurologic: Cranial nerves 2 to 12 intact. Power, tone ,sensation and reflexes normal throughout. No disturbance in gait. No tremor.  Skin: Intact, no ulceration, erythema , scaling or rash noted. Pigmentation normal throughout  Psych; Normal mood and affect. Judgement and concentration normal   Assessment & Plan:  Encounter for annual physical exam Annual exam as documented. Counseling done  re healthy lifestyle involving commitment to 150 minutes exercise per week, heart healthy diet, and attaining healthy weight.The importance of adequate sleep also discussed. Regular seat belt use and home safety, is also discussed. Changes in health habits are decided on by the patient with goals and time frames  set for achieving them. Immunization and cancer screening needs are  specifically addressed at this visit.

## 2021-10-12 LAB — LIPID PANEL
Chol/HDL Ratio: 4.8 ratio — ABNORMAL HIGH (ref 0.0–4.4)
Cholesterol, Total: 192 mg/dL (ref 100–199)
HDL: 40 mg/dL (ref >39)
LDL Chol Calc (NIH): 126 mg/dL — ABNORMAL HIGH (ref 0–99)
Triglycerides: 146 mg/dL (ref 0–149)
VLDL Cholesterol Cal: 26 mg/dL (ref 5–40)

## 2021-10-12 LAB — HEMOGLOBIN A1C
Est. average glucose Bld gHb Est-mCnc: 128 mg/dL
Hgb A1c MFr Bld: 6.1 % — ABNORMAL HIGH (ref 4.8–5.6)

## 2021-11-25 DIAGNOSIS — I129 Hypertensive chronic kidney disease with stage 1 through stage 4 chronic kidney disease, or unspecified chronic kidney disease: Secondary | ICD-10-CM | POA: Diagnosis not present

## 2021-11-25 DIAGNOSIS — R7303 Prediabetes: Secondary | ICD-10-CM | POA: Diagnosis not present

## 2021-11-25 DIAGNOSIS — M3214 Glomerular disease in systemic lupus erythematosus: Secondary | ICD-10-CM | POA: Diagnosis not present

## 2021-11-25 DIAGNOSIS — N1831 Chronic kidney disease, stage 3a: Secondary | ICD-10-CM | POA: Diagnosis not present

## 2021-12-01 ENCOUNTER — Other Ambulatory Visit: Payer: Self-pay | Admitting: Physician Assistant

## 2021-12-01 DIAGNOSIS — N1831 Chronic kidney disease, stage 3a: Secondary | ICD-10-CM | POA: Diagnosis not present

## 2021-12-01 DIAGNOSIS — I129 Hypertensive chronic kidney disease with stage 1 through stage 4 chronic kidney disease, or unspecified chronic kidney disease: Secondary | ICD-10-CM | POA: Diagnosis not present

## 2021-12-01 DIAGNOSIS — M3214 Glomerular disease in systemic lupus erythematosus: Secondary | ICD-10-CM | POA: Diagnosis not present

## 2021-12-01 DIAGNOSIS — R7303 Prediabetes: Secondary | ICD-10-CM | POA: Diagnosis not present

## 2021-12-01 NOTE — Telephone Encounter (Signed)
Next Visit: 01/03/2022  Last Visit: 09/20/2021  Last Fill: 08/18/2021  DX: Other systemic lupus erythematosus with other organ involvement  Current Dose per office note 09/20/2021: Imuran 50 mg 1 tablet daily   Labs: 09/20/2021 White blood cell count was within normal limits.  No anemia noted.  Creatinine remains elevated 1.17 and GFR was 51. LFTs were within normal limits.   Okay to refill Imuran?

## 2021-12-12 ENCOUNTER — Other Ambulatory Visit: Payer: Self-pay | Admitting: Physician Assistant

## 2021-12-12 DIAGNOSIS — M3219 Other organ or system involvement in systemic lupus erythematosus: Secondary | ICD-10-CM

## 2021-12-12 NOTE — Telephone Encounter (Signed)
Next Visit: 01/03/2022  Last Visit: 09/20/2021  Labs: 09/20/2021 White blood cell count was within normal limits.  No anemia noted.  Creatinine remains elevated 1.17 and GFR was 51. LFTs were within normal limits.   Eye exam: 05/02/2021    Current Dose per office note 09/20/2021: plaquenil 200 mg 1 tablet by mouth twice daily M-F  DX: Other systemic lupus erythematosus with other organ involvement ([place])  Last Fill: 09/14/2021  Okay to refill Plaquenil?

## 2021-12-20 NOTE — Progress Notes (Signed)
Office Visit Note  Patient: [name]             Date of Birth: [birth year]           MRN: [id number]             PCP: [name], MD Referring: [name], MD Visit Date: 01/03/2022 Occupation: '@GUAROCC'$ @  Subjective:  No chief complaint on file.   History of Present Illness: [name] is a 71 y.o. female with history of systemic lupus dermatosis.  She states she been having increased pain and discomfort in her left shoulder, left knee and her left ankle.  She states her foot feels swollen towards the end of the day.  She has been taking Imuran 50 mg 1 tablet daily and Plaquenil 200 mg 1 tablet twice a day Monday to Friday without interruption.  She states she was seen by Dr. Theador Hawthorne 3 weeks ago and had some lab work.  Activities of Daily Living:  Patient reports morning stiffness for 0 minutes.   Patient Reports nocturnal pain.  Difficulty dressing/grooming: Denies Difficulty climbing stairs: Denies Difficulty getting out of chair: Denies Difficulty using hands for taps, buttons, cutlery, and/or writing: Denies  Review of Systems  Constitutional:  Negative for fatigue.  HENT:  Negative for mouth sores and mouth dryness.   Eyes:  Positive for dryness.  Respiratory:  Negative for shortness of breath.   Cardiovascular:  Negative for chest pain and palpitations.  Gastrointestinal:  Positive for constipation. Negative for blood in stool and diarrhea.  Endocrine: Negative for increased urination.  Genitourinary:  Negative for involuntary urination.  Musculoskeletal:  Positive for joint pain, joint pain, joint swelling, myalgias, muscle weakness, morning stiffness, muscle tenderness and myalgias. Negative for gait problem.  Skin:  Negative for color change, rash, hair loss and sensitivity to sunlight.  Allergic/Immunologic: Negative for susceptible to infections.  Neurological:  Negative for dizziness and headaches.  Hematological:  Negative for swollen  glands.  Psychiatric/Behavioral:  Negative for depressed mood and sleep disturbance. The patient is not nervous/anxious.     PMFS History:  Patient Active Problem List   Diagnosis Date Noted   Encounter for annual physical exam 10/11/2021   Cervical spondylosis 06/12/2021   Chronic low back pain with left-sided sciatica 04/11/2021   Baker's cyst of knee, left 11/26/2020   Seborrheic dermatitis of scalp 07/19/2020   Essential hypertension 07/19/2020   Overweight (BMI 25.0-29.9) 07/19/2020   Systemic lupus erythematosus arthritis ([place]) 02/12/2020   Allergic sinusitis 11/04/2018   Hyperlipidemia 03/08/2017   Proteinuria 03/08/2017   SLE glomerulonephritis syndrome ([place]) 09/15/2016   High risk medication use 09/15/2016   Degeneration of lumbar intervertebral disc 09/15/2016   Personal history of other endocrine, nutritional and metabolic disease 00/17/4944   History of anemia 09/15/2016   History of proteinuria  09/15/2016   Tubular adenoma of colon 04/30/2016   Prediabetes 11/15/2014   Cervicalgia 05/11/2014   Osteopenia 07/21/2013   Anemia 12/22/2012   Systemic lupus erythematosus ([place]) 02/15/2012   H/O: gastrointestinal disease 10/01/2007    Past Medical History:  Diagnosis Date   Arthritis 02/13/2010   abnormal MRI lumbar and c spine  with disc disease   History of anemia    History of diverticulosis    History of prediabetes    Osteoporosis    SLE (systemic lupus erythematosus) ([place]) dx apprx 1990   reports immunotherapy, cytoxan and steroids at [facility], no f/u for over 4yr  Family History  Problem Relation Age of Onset   Heart disease Father    Cancer Brother 7       breast   Breast cancer Brother    Cancer Sister 87       breast   Breast cancer Sister    Colon cancer Neg Hx    Past Surgical History:  Procedure Laterality Date   ABDOMINAL HYSTERECTOMY  unsure, over 10 yrs   partial, has had abnormal pap   BLADDER REPAIR     COLONOSCOPY N/A 01/05/2014    Procedure: COLONOSCOPY;  Surgeon: Daneil Dolin, MD;  [facility]: [facility];  Service: Endoscopy;  Laterality: N/A;  12:15 PM   COLONOSCOPY N/A 07/07/2020   Procedure: COLONOSCOPY;  Surgeon: Daneil Dolin, MD;  Location: [facility];  Service: Endoscopy;  Laterality: N/A;  ASA II / AM procedure   Social History   Social History Narrative   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   Immunization History  Administered Date(s) Administered   Fluad Quad(high Dose 65+) 11/18/2018, 01/05/2020, 11/26/2020   Influenza Split 02/14/2012   Influenza Whole 12/28/2000   Influenza,inj,Quad PF,6+ Mos 12/16/2012, 01/06/2014, 01/19/2016, 12/10/2017   Moderna SARS-COV2 Booster Vaccination 04/20/2021   Moderna Sars-Covid-2 Vaccination 05/02/2019, 05/31/2019, 01/29/2020, 10/04/2020   Pneumococcal Conjugate-13 09/25/2017   Pneumococcal Polysaccharide-23 09/30/2018   Td 09/17/2003   Tdap 01/06/2014   Zoster, Live 12/17/2012     Objective: Vital Signs: BP (!) 146/83 (BP Location: Left Arm, Patient Position: Sitting, Cuff Size: Normal)   Pulse 67   Resp 14   Ht '5\' 3"'$  (1.6 m)   Wt 131 lb 6.4 oz (59.6 kg)   BMI 23.28 kg/m    Physical Exam Vitals and nursing note reviewed.  Constitutional:      Appearance: She is well-developed.  HENT:     Head: Normocephalic and atraumatic.  Eyes:     Conjunctiva/sclera: Conjunctivae normal.  Cardiovascular:     Rate and Rhythm: Normal rate and regular rhythm.     Heart sounds: Normal heart sounds.  Pulmonary:     Effort: Pulmonary effort is normal.     Breath sounds: Normal breath sounds.  Abdominal:     General: Bowel sounds are normal.     Palpations: Abdomen is soft.  Musculoskeletal:     Cervical back: Normal range of motion.  Lymphadenopathy:     Cervical: No cervical adenopathy.  Skin:    General: Skin is warm and dry.     Capillary Refill: Capillary refill takes less than 2 seconds.  Neurological:     Mental Status: She is  alert and oriented to person, place, and time.  Psychiatric:        Behavior: Behavior normal.      Musculoskeletal Exam: Cervical spine was in good range of motion.  Shoulder joints with good range of motion.  She has some tenderness over the left subacromial region.  Elbow joints and wrist joints in good range of motion.  She had no MCP PIP or DIP's synovitis or thickening.  Hip joints were in good range of motion.  She had discomfort range of motion of her left knee joint and warmth on palpation.  No effusion was noted.  No Baker's cyst was noted.  There was no tenderness over ankles or MTPs.  CDAI Exam: CDAI Score: -- Patient Global: --; Provider Global: -- Swollen: --; Tender: -- Joint Exam 01/03/2022   No joint exam has been documented for  this visit   There is currently no information documented on the homunculus. Go to the Rheumatology activity and complete the homunculus joint exam.  Investigation: No additional findings.  Imaging: No results found.  Recent Labs: Lab Results  Component Value Date   WBC 3.8 09/20/2021   HGB 13.0 09/20/2021   PLT 148 09/20/2021   NA 141 09/20/2021   K 4.5 09/20/2021   CL 106 09/20/2021   CO2 27 09/20/2021   GLUCOSE 79 09/20/2021   BUN 19 09/20/2021   CREATININE 1.17 (H) 09/20/2021   BILITOT 0.3 09/20/2021   ALKPHOS 43 (L) 04/07/2021   AST 22 09/20/2021   ALT 14 09/20/2021   PROT 7.0 09/20/2021   ALBUMIN 4.0 04/07/2021   CALCIUM 9.8 09/20/2021   GFRAA 47 (L) 11/28/2019   QFTBGOLDPLUS NEGATIVE 08/03/2021    Speciality Comments: Plaquenil eye exam normal on 05/02/2021 per my eye doctor in [facility]  Procedures:  Large Joint Inj: L knee on 01/03/2022 12:05 PM Indications: pain Details: 27 G 1.5 in needle, medial approach  Arthrogram: No  Medications: 40 mg triamcinolone acetonide 40 MG/ML; 1.5 mL lidocaine 1 % Aspirate: 0 mL Outcome: tolerated well, no immediate complications Procedure, treatment alternatives, risks  and benefits explained, specific risks discussed. Consent was given by the patient. Immediately prior to procedure a time out was called to verify the correct patient, procedure, equipment, support staff and site/side marked as required. Patient was prepped and draped in the usual sterile fashion.     Allergies: Sulfa antibiotics and Sulfonamide derivatives   Assessment / Plan:     Visit Diagnoses: Other systemic lupus erythematosus with other organ involvement ([place]) - History of dsDNA +64, Smith + 5.5, RNP+ 6.5, SSA([place]) Negative, SSB ([place]) Negative, presented initially with nephritis, hair loss, arthralgias, dermatitis: She has been taking Imuran 50 mg p.o. daily and hydroxychloroquine 200 mg p.o. twice daily Monday to Friday.  She has been tolerating both medications well.  She denies any history of oral ulcers, nasal ulcers she gives history of dry eyes.  She has been having increased left knee joint pain.  She had warmth on palpation of her left knee joint.  We decided to inject her knee joint to give her some relief.  She was in agreement.  After informed consent was obtained left knee joint was injected as described above.  Postprocedure instructions were given.  She did also having discomfort in the left shoulder over the subacromial region.  I will give her a handout on shoulder joint exercises.  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  She follows up with her nephrologist every year (Dr. Theador Hawthorne).   High risk medication use - Imuran 50 mg 1 tablet daily and plaquenil 200 mg 1 tablet by mouth twice daily M-F. Plaquenil eye exam normal on 05/02/2021.  Patient states that she had recent CBC, CMP with GFR and UA with Dr.Bhutani.  Labs were reviewed with the patient.  We will obtain labs in December including autoimmune panel.  Chronic left shoulder pain-she is having pain and discomfort in left shoulder joint.  She had tenderness over subacromial region.  I  will give her a handout on shoulder exercises.  Chronic pain of left knee-she is recurrence of pain in her left knee joint.  No effusion was noted but she had warmth on palpation.  After informed consent was obtained left knee joint was injected with lidocaine and Kenalog as described above.  Postprocedure instructions were given.  DDD (degenerative disc disease), lumbar-she is remittent pain in her lumbar region.  Age-related osteoporosis without current pathological fracture - DEXA 01/19/2020: The BMD measured at Forearm Radius 33% is 0.549 g/cm2 with a T-score of -2.3.  Managed by Dr. Moshe Cipro. fosamax-started 2019.  Patient should have repeat DEXA scan this year and then may go on drug holiday if her bone density is stable.  Advised to discuss this further with Dr. Moshe Cipro.  History of proteinuria syndrome - Followed by Dr. Theador Hawthorne.   History of hyperlipidemia  History of diverticulosis  History of prediabetes  History of adenomatous polyp of colon  History of anemia  History of renal insufficiency  Orders: No orders of the defined types were placed in this encounter.  No orders of the defined types were placed in this encounter.    Follow-Up Instructions: Return in about 3 months (around 04/05/2022) for Systemic lupus.   Bo Merino, MD  Note - This record has been created using Editor, commissioning.  Chart creation errors have been sought, but may not always  have been located. Such creation errors do not reflect on  the standard of medical care.

## 2021-12-29 ENCOUNTER — Telehealth: Payer: Self-pay | Admitting: Rheumatology

## 2021-12-29 NOTE — Telephone Encounter (Signed)
Patient had a CBC no diff and a hepatic function panel on 11/25/2021. Patient is on Imuran and PLQ. Would you like patient to come have a CBC and CMP?

## 2021-12-29 NOTE — Telephone Encounter (Signed)
Her lymphocyte count has been stable.  She may have repeat labs in December which should include CBC with differential and CMP with GFR.

## 2021-12-29 NOTE — Telephone Encounter (Signed)
Patient called stating she had labwork with Dr. Theador Hawthorne last month and is not sure if there is any additional labwork that is needed for Dr. Estanislado Pandy.  Patient requested a return call.

## 2021-12-30 DIAGNOSIS — Z23 Encounter for immunization: Secondary | ICD-10-CM | POA: Diagnosis not present

## 2021-12-30 NOTE — Telephone Encounter (Signed)
I called patient, patient verbalized understanding. 

## 2022-01-03 ENCOUNTER — Encounter: Payer: Self-pay | Admitting: Rheumatology

## 2022-01-03 ENCOUNTER — Ambulatory Visit: Payer: Medicare PPO | Attending: Rheumatology | Admitting: Rheumatology

## 2022-01-03 VITALS — BP 146/83 | HR 67 | Resp 14 | Ht 63.0 in | Wt 131.4 lb

## 2022-01-03 DIAGNOSIS — M810 Age-related osteoporosis without current pathological fracture: Secondary | ICD-10-CM | POA: Diagnosis not present

## 2022-01-03 DIAGNOSIS — M3219 Other organ or system involvement in systemic lupus erythematosus: Secondary | ICD-10-CM | POA: Diagnosis not present

## 2022-01-03 DIAGNOSIS — Z862 Personal history of diseases of the blood and blood-forming organs and certain disorders involving the immune mechanism: Secondary | ICD-10-CM

## 2022-01-03 DIAGNOSIS — M5136 Other intervertebral disc degeneration, lumbar region: Secondary | ICD-10-CM | POA: Diagnosis not present

## 2022-01-03 DIAGNOSIS — G8929 Other chronic pain: Secondary | ICD-10-CM | POA: Diagnosis not present

## 2022-01-03 DIAGNOSIS — Z8719 Personal history of other diseases of the digestive system: Secondary | ICD-10-CM

## 2022-01-03 DIAGNOSIS — M3214 Glomerular disease in systemic lupus erythematosus: Secondary | ICD-10-CM | POA: Diagnosis not present

## 2022-01-03 DIAGNOSIS — M25512 Pain in left shoulder: Secondary | ICD-10-CM | POA: Diagnosis not present

## 2022-01-03 DIAGNOSIS — Z87898 Personal history of other specified conditions: Secondary | ICD-10-CM

## 2022-01-03 DIAGNOSIS — Z79899 Other long term (current) drug therapy: Secondary | ICD-10-CM | POA: Diagnosis not present

## 2022-01-03 DIAGNOSIS — Z87448 Personal history of other diseases of urinary system: Secondary | ICD-10-CM | POA: Diagnosis not present

## 2022-01-03 DIAGNOSIS — M25562 Pain in left knee: Secondary | ICD-10-CM | POA: Diagnosis not present

## 2022-01-03 DIAGNOSIS — Z8639 Personal history of other endocrine, nutritional and metabolic disease: Secondary | ICD-10-CM | POA: Diagnosis not present

## 2022-01-03 DIAGNOSIS — M25462 Effusion, left knee: Secondary | ICD-10-CM

## 2022-01-03 DIAGNOSIS — Z86010 Personal history of colonic polyps: Secondary | ICD-10-CM

## 2022-01-03 MED ORDER — TRIAMCINOLONE ACETONIDE 40 MG/ML IJ SUSP
40.0000 mg | INTRAMUSCULAR | Status: AC | PRN
  Administered 2022-01-03: 40 mg via INTRA_ARTICULAR

## 2022-01-03 MED ORDER — LIDOCAINE HCL 1 % IJ SOLN
1.5000 mL | INTRAMUSCULAR | Status: AC | PRN
  Administered 2022-01-03: 1.5 mL

## 2022-01-03 NOTE — Telephone Encounter (Signed)
These labs are located in [facility].

## 2022-01-03 NOTE — Patient Instructions (Addendum)
Standing Labs We placed an order today for your standing lab work.   Please have your standing labs drawn in December and every 3 months  Please have your labs drawn 2 weeks prior to your appointment so that the provider can discuss your lab results at your appointment.  Please note that you may see your imaging and lab results in [place] before we have reviewed them. We will contact you once all results are reviewed. Please allow our office up to 72 hours to thoroughly review all of the results before contacting the office for clarification of your results.  Lab hours are: Monday through Thursday from 1:30 pm-4:30 pm and Friday from 1:30 pm- 4:00 pm  You may experience shorter wait times on Monday, Thursday or Friday afternoons,.   Effective January 23, 2022, new lab hours will be: Monday through Thursday from 8:00 am -12:30 pm and 1:00 pm-5:00 pm and Friday from 8:00 am-12:00 pm.  Please be advised, all patients with office appointments requiring lab work will take precedent over walk-in lab work.   Labs are drawn by [facility]. Please bring your co-pay at the time of your lab draw.  You may receive a bill from [facility] for your lab work.  Please note if you are on Hydroxychloroquine and and an order has been placed for a Hydroxychloroquine level, you will need to have it drawn 4 hours or more after your last dose.  If you wish to have your labs drawn at another location, please call the office 24 hours in advance so we can fax the orders.  The office is located at [address], [place], [place], [place] [address] No appointment is necessary.    If you have any questions regarding directions or hours of operation,  please call [phone].   As a reminder, please drink plenty of water prior to coming for your lab work. Thanks!   Vaccines You are taking a medication(s) that can suppress your immune system.  The following immunizations are recommended: Flu annually Covid-19  Td/Tdap  (tetanus, diphtheria, pertussis) every 10 years Pneumonia (Prevnar 15 then Pneumovax 23 at least 1 year apart.  Alternatively, can take Prevnar 20 without needing additional dose) Shingrix: 2 doses from 4 weeks to 6 months apart  Please check with your PCP to make sure you are up to date.   If you have signs or symptoms of an infection or start antibiotics: First, call your PCP for workup of your infection. Hold your medication through the infection, until you complete your antibiotics, and until symptoms resolve if you take the following: Injectable medication (Actemra, Benlysta, Cimzia, Cosentyx, Enbrel, Humira, Kevzara, Orencia, Remicade, Simponi, Stelara, Taltz, Tremfya) Methotrexate Leflunomide (Arava) Mycophenolate (Cellcept) Roma Kayser, or Rinvoq   Shoulder Exercises Ask your health care provider which exercises are safe for you. Do exercises exactly as told by your health care provider and adjust them as directed. It is normal to feel mild stretching, pulling, tightness, or discomfort as you do these exercises. Stop right away if you feel sudden pain or your pain gets worse. Do not begin these exercises until told by your health care provider. Stretching exercises External rotation and abduction This exercise is sometimes called corner stretch. The exercise rotates your arm outward (external rotation) and moves your arm out from your body (abduction). Stand in a doorway with one of your feet slightly in front of the other. This is called a staggered stance. If you cannot reach your forearms to the door  frame, stand facing a corner of a room. Choose one of the following positions as told by your health care provider: Place your hands and forearms on the door frame above your head. Place your hands and forearms on the door frame at the height of your head. Place your hands on the door frame at the height of your elbows. Slowly move your weight onto your front foot until you feel  a stretch across your chest and in the front of your shoulders. Keep your head and chest upright and keep your abdominal muscles tight. Hold for __________ seconds. To release the stretch, shift your weight to your back foot. Repeat __________ times. Complete this exercise __________ times a day. Extension, standing  Stand and hold a broomstick, a cane, or a similar object behind your back. Your hands should be a little wider than shoulder-width apart. Your palms should face away from your back. Keeping your elbows straight and your shoulder muscles relaxed, move the stick away from your body until you feel a stretch in your shoulders (extension). Avoid shrugging your shoulders while you move the stick. Keep your shoulder blades tucked down toward the middle of your back. Hold for __________ seconds. Slowly return to the starting position. Repeat __________ times. Complete this exercise __________ times a day. Range-of-motion exercises Pendulum  Stand near a wall or a surface that you can hold onto for balance. Bend at the waist and let your left / right arm hang straight down. Use your other arm to support you. Keep your back straight and do not lock your knees. Relax your left / right arm and shoulder muscles, and move your hips and your trunk so your left / right arm swings freely. Your arm should swing because of the motion of your body, not because you are using your arm or shoulder muscles. Keep moving your hips and trunk so your arm swings in the following directions, as told by your health care provider: Side to side. Forward and backward. In clockwise and counterclockwise circles. Continue each motion for __________ seconds, or for as long as told by your health care provider. Slowly return to the starting position. Repeat __________ times. Complete this exercise __________ times a day. Shoulder flexion, standing  Stand and hold a broomstick, a cane, or a similar object. Place  your hands a little more than shoulder-width apart on the object. Your left / right hand should be palm-up, and your other hand should be palm-down. Keep your elbow straight and your shoulder muscles relaxed. Push the stick up with your healthy arm to raise your left / right arm in front of your body, and then over your head until you feel a stretch in your shoulder (flexion). Avoid shrugging your shoulder while you raise your arm. Keep your shoulder blade tucked down toward the middle of your back. Hold for __________ seconds. Slowly return to the starting position. Repeat __________ times. Complete this exercise __________ times a day. Shoulder abduction, standing  Stand and hold a broomstick, a cane, or a similar object. Place your hands a little more than shoulder-width apart on the object. Your left / right hand should be palm-up, and your other hand should be palm-down. Keep your elbow straight and your shoulder muscles relaxed. Push the object across your body toward your left / right side. Raise your left / right arm to the side of your body (abduction) until you feel a stretch in your shoulder. Do not raise your arm above shoulder  height unless your health care provider tells you to do that. If directed, raise your arm over your head. Avoid shrugging your shoulder while you raise your arm. Keep your shoulder blade tucked down toward the middle of your back. Hold for __________ seconds. Slowly return to the starting position. Repeat __________ times. Complete this exercise __________ times a day. Internal rotation  Place your left / right hand behind your back, palm-up. Use your other hand to dangle an exercise band, a broomstick, or a similar object over your shoulder. Grasp the band with your left / right hand so you are holding on to both ends. Gently pull up on the band until you feel a stretch in the front of your left / right shoulder. The movement of your arm toward the center of  your body is called internal rotation. Avoid shrugging your shoulder while you raise your arm. Keep your shoulder blade tucked down toward the middle of your back. Hold for __________ seconds. Release the stretch by letting go of the band and lowering your hands. Repeat __________ times. Complete this exercise __________ times a day. Strengthening exercises External rotation  Sit in a stable chair without armrests. Secure an exercise band to a stable object at elbow height on your left / right side. Place a soft object, such as a folded towel or a small pillow, between your left / right upper arm and your body to move your elbow about 4 inches (10 cm) away from your side. Hold the end of the exercise band so it is tight and there is no slack. Keeping your elbow pressed against the soft object, slowly move your forearm out, away from your abdomen (external rotation). Keep your body steady so only your forearm moves. Hold for __________ seconds. Slowly return to the starting position. Repeat __________ times. Complete this exercise __________ times a day. Shoulder abduction  Sit in a stable chair without armrests, or stand up. Hold a __________ lb / kg weight in your left / right hand, or hold an exercise band with both hands. Start with your arms straight down and your left / right palm facing in, toward your body. Slowly lift your left / right hand out to your side (abduction). Do not lift your hand above shoulder height unless your health care provider tells you that this is safe. Keep your arms straight. Avoid shrugging your shoulder while you do this movement. Keep your shoulder blade tucked down toward the middle of your back. Hold for __________ seconds. Slowly lower your arm, and return to the starting position. Repeat __________ times. Complete this exercise __________ times a day. Shoulder extension  Sit in a stable chair without armrests, or stand up. Secure an exercise band to  a stable object in front of you so it is at shoulder height. Hold one end of the exercise band in each hand. Straighten your elbows and lift your hands up to shoulder height. Squeeze your shoulder blades together as you pull your hands down to the sides of your thighs (extension). Stop when your hands are straight down by your sides. Do not let your hands go behind your body. Hold for __________ seconds. Slowly return to the starting position. Repeat __________ times. Complete this exercise __________ times a day. Shoulder row  Sit in a stable chair without armrests, or stand up. Secure an exercise band to a stable object in front of you so it is at chest height. Hold one end of the exercise band  in each hand. Position your palms so that your thumbs are facing the ceiling (neutral position). Bend each of your elbows to a 90-degree angle (right angle) and keep your upper arms at your sides. Step back or move the chair back until the band is tight and there is no slack. Slowly pull your elbows back behind you. Hold for __________ seconds. Slowly return to the starting position. Repeat __________ times. Complete this exercise __________ times a day. Shoulder press-ups  Sit in a stable chair that has armrests. Sit upright, with your feet flat on the floor. Put your hands on the armrests so your elbows are bent and your fingers are pointing forward. Your hands should be about even with the sides of your body. Push down on the armrests and use your arms to lift yourself off the chair. Straighten your elbows and lift yourself up as much as you comfortably can. Move your shoulder blades down, and avoid letting your shoulders move up toward your ears. Keep your feet on the ground. As you get stronger, your feet should support less of your body weight as you lift yourself up. Hold for __________ seconds. Slowly lower yourself back into the chair. Repeat __________ times. Complete this exercise  __________ times a day. Wall push-ups  Stand so you are facing a stable wall. Your feet should be about one arm-length away from the wall. Lean forward and place your palms on the wall at shoulder height. Keep your feet flat on the floor as you bend your elbows and lean forward toward the wall. Hold for __________ seconds. Straighten your elbows to push yourself back to the starting position. Repeat __________ times. Complete this exercise __________ times a day. This information is not intended to replace advice given to you by your health care provider. Make sure you discuss any questions you have with your health care provider. Document Revised: 05/03/2021 Document Reviewed: 05/03/2021 [employer] Patient Education  [employer].

## 2022-01-09 ENCOUNTER — Ambulatory Visit: Payer: Medicare PPO

## 2022-01-09 ENCOUNTER — Encounter: Payer: Self-pay | Admitting: Family Medicine

## 2022-01-17 ENCOUNTER — Ambulatory Visit (INDEPENDENT_AMBULATORY_CARE_PROVIDER_SITE_OTHER): Payer: Medicare PPO | Admitting: Internal Medicine

## 2022-01-17 ENCOUNTER — Encounter: Payer: Self-pay | Admitting: Internal Medicine

## 2022-01-17 DIAGNOSIS — Z0000 Encounter for general adult medical examination without abnormal findings: Secondary | ICD-10-CM | POA: Diagnosis not present

## 2022-01-17 NOTE — Progress Notes (Signed)
Subjective:  This is a telephone encounter between [name] and [name] on 01/17/2022 for AWV. The visit was conducted with the patient located at home and [name] at [facility]. The patient's identity was confirmed using their DOB and current address. The patient has consented to being evaluated through a telephone encounter and understands the associated risks (an examination cannot be done and the patient may need to come in for an appointment) / benefits (allows the patient to remain at home, decreasing exposure to coronavirus).    [name] is a 71 y.o. female who presents for Medicare Annual (Subsequent) preventive examination.  Review of Systems    Review of Systems  All other systems reviewed and are negative.    Objective:    There were no vitals filed for this visit. There is no height or weight on file to calculate BMI.     01/17/2022    1:09 PM 01/05/2021    9:41 AM 08/16/2020    4:08 PM 07/07/2020   10:06 AM 01/05/2020    2:20 PM 01/05/2014   11:20 AM  Advanced Directives  Does Patient Have a Medical Advance Directive? No No No No No No  Would patient like information on creating a medical advance directive? Yes (MAU/Ambulatory/Procedural Areas - Information given) No - Patient declined No - Patient declined No - Patient declined No - Patient declined Yes - Educational materials given    Current Medications (verified) Outpatient Encounter Medications as of 01/17/2022  Medication Sig   alendronate (FOSAMAX) 70 MG tablet TAKE ONE TABLET BY MOUTH WITH A FULL GLASS OF WATER ON AN EMPTY STOMACH   amLODipine (NORVASC) 2.5 MG tablet Take 1 tablet (2.5 mg total) by mouth daily.   azaTHIOprine (IMURAN) 50 MG tablet TAKE ONE TABLET ('50MG'$  TOTAL) BY MOUTH DAILY   brompheniramine-pseudoephedrine-DM 30-2-10 MG/5ML syrup Take 5 mLs by mouth 4 (four) times daily as needed. (Patient not taking: Reported on 01/03/2022)   Calcium Carb-Cholecalciferol (CALCIUM 600+D)  600-800 MG-UNIT TABS Take 2 tablets by mouth daily.   cetirizine (ZYRTEC) 10 MG tablet Take 1 tablet (10 mg total) by mouth daily.   cholecalciferol (VITAMIN D3) 25 MCG (1000 UNIT) tablet Take 2,000 Units by mouth daily.   Cyanocobalamin (B-12 PO) Take 1 tablet by mouth daily.   ELDERBERRY PO Take by mouth.   Fluocinolone Acetonide 0.01 % OIL Apply sparingly to scalp 3 times weekly as needed (Patient not taking: Reported on 01/03/2022)   Fluocinolone Acetonide Scalp 0.01 % OIL Apply sparingly to scalp 3 times weekly as needed   hydroxychloroquine (PLAQUENIL) 200 MG tablet TAKE ONE TABLET BY MOUTH TWICE A DAY MONDAY THROUGH FRIDAY.   ipratropium (ATROVENT) 0.06 % nasal spray Place 2 sprays into both nostrils 3 (three) times daily. (Patient taking differently: Place 2 sprays into both nostrils as needed for rhinitis.)   Multiple Vitamins-Minerals (SYSTANE ICAPS AREDS2) CAPS Take 2 capsules by mouth daily.   VITAMIN A PO Take 1 capsule by mouth daily.   vitamin C (ASCORBIC ACID) 500 MG tablet Take 1,000 mg by mouth daily.   No facility-administered encounter medications on file as of 01/17/2022.    Allergies (verified) Sulfa antibiotics and Sulfonamide derivatives   History: Past Medical History:  Diagnosis Date   Arthritis 02/13/2010   abnormal MRI lumbar and c spine  with disc disease   History of anemia    History of diverticulosis    History of prediabetes    Osteoporosis  SLE (systemic lupus erythematosus) ([place]) dx apprx 1990   reports immunotherapy, cytoxan and steroids at [facility], no f/u for over 73yr   Past Surgical History:  Procedure Laterality Date   ABDOMINAL HYSTERECTOMY  unsure, over 10 yrs   partial, has had abnormal pap   BLADDER REPAIR     COLONOSCOPY N/A 01/05/2014   Procedure: COLONOSCOPY;  Surgeon: RDaneil Dolin MD;  [facility];  Service: Endoscopy;  Laterality: N/A;  12:15 PM   COLONOSCOPY N/A 07/07/2020   Procedure: COLONOSCOPY;  Surgeon: RDaneil Dolin MD;  [facility];  Service: Endoscopy;  Laterality: N/A;  ASA II / AM procedure   Family History  Problem Relation Age of Onset   Heart disease Father    Cancer Brother [phone]      breast   Breast cancer Brother    Cancer Sister 510      breast   Breast cancer Sister    Colon cancer Neg Hx    Social History   Socioeconomic History   Marital status: Married    Spouse name: CShanice Poznanski  Number of children: 3   Years of education: Not on file   Highest education level: Not on file  Occupational History   Not on file  Tobacco Use   Smoking status: Never    Passive exposure: Never   Smokeless tobacco: Never  Vaping Use   Vaping Use: Never used  Substance and Sexual Activity   Alcohol use: No   Drug use: No   Sexual activity: Not Currently  Other Topics Concern   Not on file  Social History Narrative   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[employer]   1 grandchild   Social Determinants of Health   Financial Resource Strain: Low Risk  (01/05/2021)   Overall Financial Resource Strain (CARDIA)    Difficulty of Paying Living Expenses: Not hard at all  Food Insecurity: No Food Insecurity (01/05/2021)   Hunger Vital Sign    Worried About Running Out of Food in the Last Year: Never true    Ran Out of Food in the Last Year: Never true  Transportation Needs: No Transportation Needs (01/05/2021)   PRAPARE - THydrologist(Medical): No    Lack of Transportation (Non-Medical): No  Physical Activity: Sufficiently Active (01/05/2021)   Exercise Vital Sign    Days of Exercise per Week: 5 days    Minutes of Exercise per Session: 30 min  Stress: No Stress Concern Present (01/05/2021)   [place]   Feeling of Stress : Not at all  Social Connections: [place](01/05/2021)   Social Connection and Isolation Panel [NHANES]    Frequency of Communication with Friends  and Family: More than three times a week    Frequency of Social Gatherings with Friends and Family: More than three times a week    Attends Religious Services: More than 4 times per year    Active Member of [employer] Organizations: Yes    Attends CMusic therapist More than 4 times per year    Marital Status: Married    Tobacco Counseling Counseling given: Not Answered   Clinical Intake:  Pre-visit preparation completed: Yes  Pain : No/denies pain     Diabetes: No  How often do you need to have someone help you when you read instructions, pamphlets, or other written materials from your doctor or  pharmacy?: 1 - Never What is the last grade level you completed in school?: Some college  Diabetic?No         Activities of Daily Living     No data to display          Patient Care Team: [name], MD as PCP - General Rourk, Cristopher Estimable, MD as Consulting Physician (Gastroenterology)  Indicate any recent Medical Services you may have received from other than [facility] providers in the past year (date may be approximate).     Assessment:   This is a routine wellness examination for Vall[place].  Hearing/Vision screen No results found.  Dietary issues and exercise activities discussed:     Goals Addressed   None    Depression Screen    10/11/2021    8:46 AM 04/06/2021    9:33 AM 04/06/2021    9:06 AM 01/05/2021    9:39 AM 11/26/2020    8:12 AM 10/04/2020    9:07 AM 07/19/2020    9:50 AM  PHQ 2/9 Scores  PHQ - 2 Score 0 3 0 0 0 0 0  PHQ- 9 Score  3         Fall Risk    01/17/2022    1:11 PM 10/11/2021    8:46 AM 06/07/2021    2:48 PM 04/06/2021    9:06 AM 01/05/2021    9:42 AM  Fall Risk   Falls in the past year? 1 0 1 0 0  Number falls in past yr: 0 0 1 0 0  Injury with Fall? 0 0 0 0 0  Risk for fall due to :    No Fall Risks No Fall Risks  Follow up    Falls evaluation completed Falls prevention discussed    FALL RISK PREVENTION  PERTAINING TO THE HOME:  Any stairs in or around the home? No Has ramps If so, are there any without handrails? No  Home free of loose throw rugs in walkways, pet beds, electrical cords, etc? Yes  Adequate lighting in your home to reduce risk of falls? Yes   ASSISTIVE DEVICES UTILIZED TO PREVENT FALLS:  Life alert? No  Use of a cane, walker or w/c? No  Grab bars in the bathroom? Yes  Shower chair or bench in shower? No  Elevated toilet seat or a handicapped toilet? Yes   Cognitive Function:        01/05/2021    9:45 AM 01/05/2020    2:24 PM 12/13/2018    8:12 AM  6CIT Screen  What Year? 0 points 0 points 0 points  What month? 0 points 0 points 0 points  What time? 0 points 0 points 0 points  Count back from 20 0 points 0 points 0 points  Months in reverse 0 points 0 points 0 points  Repeat phrase 0 points 0 points 0 points  Total Score 0 points 0 points 0 points    Immunizations Immunization History  Administered Date(s) Administered   Fluad Quad(high Dose 65+) 11/18/2018, 01/05/2020, 11/26/2020   Influenza Split 02/14/2012   Influenza Whole 12/28/2000   Influenza,inj,Quad PF,6+ Mos 12/16/2012, 01/06/2014, 01/19/2016, 12/10/2017   Moderna SARS-COV2 Booster Vaccination 04/20/2021   Moderna Sars-Covid-2 Vaccination 05/02/2019, 05/31/2019, 01/29/2020, 10/04/2020   Pneumococcal Conjugate-13 09/25/2017   Pneumococcal Polysaccharide-23 09/30/2018   Td 09/17/2003   Tdap 01/06/2014   Zoster, Live 12/17/2012    TDAP status: Up to date  Flu Vaccine status: Up to date [facility]  Pneumococcal vaccine status: Up to date  Covid-19 vaccine status: Completed vaccines Needs to present card   Qualifies for Shingles Vaccine? Yes   Zostavax completed No   Shingrix Completed?: Yes  Screening Tests Health Maintenance  Topic Date Due   COVID-19 Vaccine (5 - Moderna risk series) 06/15/2021   INFLUENZA VACCINE  10/25/2021   Medicare Annual  Wellness (AWV)  02/04/2022   Zoster Vaccines- Shingrix (1 of 2) 10/08/2024 (Originally 06/13/1969)   MAMMOGRAM  03/15/2023   TETANUS/TDAP  01/07/2024   COLONOSCOPY (Pts 45-76yr Insurance coverage will need to be confirmed)  07/08/2030   Pneumonia Vaccine 71 Years old  Completed   DEXA SCAN  Completed   Hepatitis C Screening  Completed   HPV VACCINES  Aged Out    Health Maintenance  Health Maintenance Due  Topic Date Due   COVID-19 Vaccine (5 - Moderna risk series) 06/15/2021   INFLUENZA VACCINE  10/25/2021   Medicare Annual Wellness (AWV)  02/04/2022    Colorectal cancer screening: Type of screening: Colonoscopy. Completed 07/07/2020. Repeat every 10 years  Mammogram status: Completed 03/14/2021. Repeat every year, 2 years  Bone Density status: Completed 01/19/2020. Results reflect: Bone density results: OSTEOPOROSIS. On treatment.  Lung Cancer Screening: (Low Dose CT Chest recommended if Age 69-80years, 30 pack-year currently smoking OR have quit w/in 15years.) does not qualify.    Additional Screening:  Hepatitis C Screening: does not qualify; Completed 08/03/2021  Vision Screening: Recommended annual ophthalmology exams for early detection of glaucoma and other disorders of the eye. Is the patient up to date with their annual eye exam?  Yes  Who is the provider or what is the name of the office in which the patient attends annual eye exams? [facility] If pt is not established with a provider, would they like to be referred to a provider to establish care? No .   Dental Screening: Recommended annual dental exams for proper oral hygiene  Community Resource Referral / Chronic Care Management: CRR required this visit?  No   CCM required this visit?  No      Plan:     I have personally reviewed and noted the following in the patient's chart:   Medical and social history Use of alcohol, tobacco or illicit drugs  Current medications and supplements including opioid  prescriptions. Patient is not currently taking opioid prescriptions. Functional ability and status Nutritional status Physical activity Advanced directives List of other physicians Hospitalizations, surgeries, and ER visits in previous 12 months Vitals Screenings to include cognitive, depression, and falls Referrals and appointments  In addition, I have reviewed and discussed with patient certain preventive protocols, quality metrics, and best practice recommendations. A written personalized care plan for preventive services as well as general preventive health recommendations were provided to patient.     JLorene Dy MD   01/17/2022

## 2022-01-17 NOTE — Patient Instructions (Signed)
  [name] , Thank you for taking time to come for your Medicare Wellness Visit. I appreciate your ongoing commitment to your health goals. Please review the following plan we discussed and let me know if I can assist you in the future.   These are the goals we discussed: Eating healthy, continue to eat more fruits and vegetables.  This is a list of the screening recommended for you and due dates:  Health Maintenance  Topic Date Due   COVID-19 Vaccine (5 - Moderna risk series) 06/15/2021   Flu Shot  10/25/2021   Medicare Annual Wellness Visit  02/04/2022   Zoster (Shingles) Vaccine (1 of 2) 10/08/2024*   Mammogram  03/15/2023   Tetanus Vaccine  01/07/2024   Colon Cancer Screening  07/08/2030   Pneumonia Vaccine  Completed   DEXA scan (bone density measurement)  Completed   Hepatitis C Screening: USPSTF Recommendation to screen - Ages 46-79 yo.  Completed   HPV Vaccine  Aged Out  *Topic was postponed. The date shown is not the original due date.

## 2022-03-04 ENCOUNTER — Other Ambulatory Visit: Payer: Self-pay | Admitting: Family Medicine

## 2022-03-22 ENCOUNTER — Other Ambulatory Visit: Payer: Self-pay | Admitting: Physician Assistant

## 2022-03-22 DIAGNOSIS — M3219 Other organ or system involvement in systemic lupus erythematosus: Secondary | ICD-10-CM

## 2022-03-22 NOTE — Telephone Encounter (Signed)
Next Visit: 04/03/2022  Last Visit: 01/03/2022  Labs: 09/20/2021 White blood cell count was within normal limits.  No anemia noted.  Creatinine remains elevated 1.17 and GFR was 51.  Please advise the patient to continue to avoid NSAIDs.  Please forward results to nephrologist as requested.  LFTs were within normal limits.  Continue on current dose of Imuran as prescribed. I called  patient, labs are due.  Eye exam: 05/02/2021   Current Dose per office note 01/03/2022: plaquenil 200 mg 1 tablet by mouth twice daily M-F   DX: Other systemic lupus erythematosus with other organ involvement   Last Fill: 12/12/2021  Okay to refill Plaquenil?

## 2022-03-28 ENCOUNTER — Telehealth: Payer: Self-pay | Admitting: Rheumatology

## 2022-03-28 DIAGNOSIS — M3214 Glomerular disease in systemic lupus erythematosus: Secondary | ICD-10-CM

## 2022-03-28 DIAGNOSIS — M3219 Other organ or system involvement in systemic lupus erythematosus: Secondary | ICD-10-CM

## 2022-03-28 DIAGNOSIS — Z79899 Other long term (current) drug therapy: Secondary | ICD-10-CM

## 2022-03-28 NOTE — Telephone Encounter (Signed)
Patient called the office requesting lab orders be sent to [facility] in [place]. Patient states she is going tomorrow.

## 2022-03-28 NOTE — Telephone Encounter (Signed)
Lab Orders released.  

## 2022-03-29 DIAGNOSIS — Z79899 Other long term (current) drug therapy: Secondary | ICD-10-CM | POA: Diagnosis not present

## 2022-03-29 DIAGNOSIS — M3219 Other organ or system involvement in systemic lupus erythematosus: Secondary | ICD-10-CM | POA: Diagnosis not present

## 2022-03-29 DIAGNOSIS — M3214 Glomerular disease in systemic lupus erythematosus: Secondary | ICD-10-CM | POA: Diagnosis not present

## 2022-03-29 NOTE — Progress Notes (Unsigned)
Office Visit Note  Patient: [name]             Date of Birth: 1950/10/10           MRN: [id number]             PCP: [name], MD Referring: [name], MD Visit Date: 04/03/2022 Occupation: '@GUAROCC'$ @  Subjective:  Discuss result lab results  History of Present Illness: [name] is a 72 y.o. female with history of systemic lupus erythematosus, lupus nephritis, and osteoporosis. Patient remains on Imuran 50 mg 1 tablet daily and plaquenil 200 mg 1 tablet by mouth twice daily M-F.   Lab work from 03/29/22 was reviewed today in the office: ANA 1:160NH, 1:80 cytoplasmic, dsDNA negative, complements WNL, and ESR WNL.   CBC and CMP updated on 03/29/22.  Her next lab work will be due in April and every 3 months.  Plaquenil eye exam normal on 05/02/2021 per my eye doctor in [facility]   Activities of Daily Living:  Patient reports morning stiffness for *** {minute/hour:19697}.   Patient {ACTIONS;DENIES/REPORTS:21021675::"Denies"} nocturnal pain.  Difficulty dressing/grooming: {ACTIONS;DENIES/REPORTS:21021675::"Denies"} Difficulty climbing stairs: {ACTIONS;DENIES/REPORTS:21021675::"Denies"} Difficulty getting out of chair: {ACTIONS;DENIES/REPORTS:21021675::"Denies"} Difficulty using hands for taps, buttons, cutlery, and/or writing: {ACTIONS;DENIES/REPORTS:21021675::"Denies"}  No Rheumatology ROS completed.   PMFS History:  Patient Active Problem List   Diagnosis Date Noted   Encounter for annual physical exam 10/11/2021   Cervical spondylosis 06/12/2021   Chronic low back pain with left-sided sciatica 04/11/2021   Baker's cyst of knee, left 11/26/2020   Seborrheic dermatitis of scalp 07/19/2020   Essential hypertension 07/19/2020   Overweight (BMI 25.0-29.9) 07/19/2020   Systemic lupus erythematosus arthritis ([place]) 02/12/2020   Allergic sinusitis 11/04/2018   Hyperlipidemia 03/08/2017   Proteinuria 03/08/2017   SLE glomerulonephritis syndrome  ([place]) 09/15/2016   High risk medication use 09/15/2016   Degeneration of lumbar intervertebral disc 09/15/2016   Personal history of other endocrine, nutritional and metabolic disease 38/12/1749   History of anemia 09/15/2016   History of proteinuria  09/15/2016   Tubular adenoma of colon 04/30/2016   Prediabetes 11/15/2014   Cervicalgia 05/11/2014   Osteopenia 07/21/2013   Anemia 12/22/2012   Systemic lupus erythematosus ([place]) 02/15/2012   H/O: gastrointestinal disease 10/01/2007    Past Medical History:  Diagnosis Date   Arthritis 02/13/2010   abnormal MRI lumbar and c spine  with disc disease   History of anemia    History of diverticulosis    History of prediabetes    Osteoporosis    SLE (systemic lupus erythematosus) ([place]) dx apprx 1990   reports immunotherapy, cytoxan and steroids at [facility], no f/u for over 34yr    Family History  Problem Relation Age of Onset   Heart disease Father    Cancer Brother 661      breast   Breast cancer Brother    Cancer Sister 53      breast   Breast cancer Sister    Colon cancer Neg Hx    Past Surgical History:  Procedure Laterality Date   ABDOMINAL HYSTERECTOMY  unsure, over 10 yrs   partial, has had abnormal pap   BLADDER REPAIR     COLONOSCOPY N/A 01/05/2014   Procedure: COLONOSCOPY;  Surgeon: RDaneil Dolin MD;  [facility];  Service: Endoscopy;  Laterality: N/A;  12:15 PM   COLONOSCOPY N/A 07/07/2020   Procedure: COLONOSCOPY;  Surgeon: RDaneil Dolin MD;  Location[facility];  Service: Endoscopy;  Laterality: N/A;  ASA II / AM procedure   Social History   Social History Narrative   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   Immunization History  Administered Date(s) Administered   Fluad Quad(high Dose 65+) 11/18/2018, 01/05/2020, 11/26/2020   Influenza Split 02/14/2012   Influenza Whole 12/28/2000   Influenza,inj,Quad PF,6+ Mos 12/16/2012, 01/06/2014, 01/19/2016, 12/10/2017    Influenza-Unspecified 12/27/2021   Moderna SARS-COV2 Booster Vaccination 04/20/2021   Moderna Sars-Covid-2 Vaccination 05/02/2019, 05/31/2019, 01/29/2020, 10/04/2020   Pneumococcal Conjugate-13 09/25/2017   Pneumococcal Polysaccharide-23 09/30/2018   Td 09/17/2003   Tdap 01/06/2014   Zoster, Live 12/17/2012     Objective: Vital Signs: There were no vitals taken for this visit.   Physical Exam Vitals and nursing note reviewed.  Constitutional:      Appearance: She is well-developed.  HENT:     Head: Normocephalic and atraumatic.  Eyes:     Conjunctiva/sclera: Conjunctivae normal.  Cardiovascular:     Rate and Rhythm: Normal rate and regular rhythm.     Heart sounds: Normal heart sounds.  Pulmonary:     Effort: Pulmonary effort is normal.     Breath sounds: Normal breath sounds.  Abdominal:     General: Bowel sounds are normal.     Palpations: Abdomen is soft.  Musculoskeletal:     Cervical back: Normal range of motion.  Lymphadenopathy:     Cervical: No cervical adenopathy.  Skin:    General: Skin is warm and dry.     Capillary Refill: Capillary refill takes less than 2 seconds.  Neurological:     Mental Status: She is alert and oriented to person, place, and time.  Psychiatric:        Behavior: Behavior normal.      Musculoskeletal Exam: ***  CDAI Exam: CDAI Score: -- Patient Global: --; Provider Global: -- Swollen: --; Tender: -- Joint Exam 04/03/2022   No joint exam has been documented for this visit   There is currently no information documented on the homunculus. Go to the Rheumatology activity and complete the homunculus joint exam.  Investigation: No additional findings.  Imaging: No results found.  Recent Labs: Lab Results  Component Value Date   WBC 3.8 09/20/2021   HGB 13.0 09/20/2021   PLT 148 09/20/2021   NA 141 09/20/2021   K 4.5 09/20/2021   CL 106 09/20/2021   CO2 27 09/20/2021   GLUCOSE 79 09/20/2021   BUN 19 09/20/2021    CREATININE 1.17 (H) 09/20/2021   BILITOT 0.3 09/20/2021   ALKPHOS 43 (L) 04/07/2021   AST 22 09/20/2021   ALT 14 09/20/2021   PROT 7.0 09/20/2021   ALBUMIN 4.0 04/07/2021   CALCIUM 9.8 09/20/2021   GFRAA 47 (L) 11/28/2019   QFTBGOLDPLUS NEGATIVE 08/03/2021    Speciality Comments: Plaquenil eye exam normal on 05/02/2021 per my eye doctor in [facility]  Procedures:  No procedures performed Allergies: Sulfa antibiotics and Sulfonamide derivatives   Assessment / Plan:     Visit Diagnoses: No diagnosis found.  Orders: No orders of the defined types were placed in this encounter.  No orders of the defined types were placed in this encounter.   Face-to-face time spent with patient was *** minutes. Greater than 50% of time was spent in counseling and coordination of care.  Follow-Up Instructions: No follow-ups on file.   Earnestine Mealing, CMA  Note - This record has been created using Editor, commissioning.  Chart creation errors have been sought, but may not always  have been located. Such creation errors do not reflect on  the standard of medical care.

## 2022-03-30 NOTE — Progress Notes (Signed)
Complements WNL.  ESR WNL. UA-trace leukocytes.  Negative for nitrites and bacteria.  If she develops symptoms of a UTI she should follow up with her PCP.  Creatinine elevated-1.24.  GFR low 47.  Avoid the use of NSAIDs.  WBC count is low-3.2. rest of CBC WNL.

## 2022-03-31 LAB — COMPLETE METABOLIC PANEL WITH GFR
AG Ratio: 1.2 (calc) (ref 1.0–2.5)
ALT: 20 U/L (ref 6–29)
AST: 30 U/L (ref 10–35)
Albumin: 4.3 g/dL (ref 3.6–5.1)
Alkaline phosphatase (APISO): 37 U/L (ref 37–153)
BUN/Creatinine Ratio: 17 (calc) (ref 6–22)
BUN: 21 mg/dL (ref 7–25)
CO2: 29 mmol/L (ref 20–32)
Calcium: 9.7 mg/dL (ref 8.6–10.4)
Chloride: 104 mmol/L (ref 98–110)
Creat: 1.24 mg/dL — ABNORMAL HIGH (ref 0.50–1.05)
Globulin: 3.6 g/dL (calc) (ref 1.9–3.7)
Glucose, Bld: 91 mg/dL (ref 65–99)
Potassium: 4.6 mmol/L (ref 3.5–5.3)
Sodium: 139 mmol/L (ref 135–146)
Total Bilirubin: 0.5 mg/dL (ref 0.2–1.2)
Total Protein: 7.9 g/dL (ref 6.1–8.1)
eGFR: 47 mL/min/{1.73_m2} — ABNORMAL LOW (ref >=60)

## 2022-03-31 LAB — CBC WITH DIFFERENTIAL/PLATELET
Absolute Monocytes: 381 cells/uL (ref 200–950)
Basophils Absolute: 42 cells/uL (ref 0–200)
Basophils Relative: 1.3 %
Eosinophils Absolute: 51 cells/uL (ref 15–500)
Eosinophils Relative: 1.6 %
HCT: 42.4 % (ref 35.0–45.0)
Hemoglobin: 13.8 g/dL (ref 11.7–15.5)
Lymphs Abs: 1107 cells/uL (ref 850–3900)
MCH: 28 pg (ref 27.0–33.0)
MCHC: 32.5 g/dL (ref 32.0–36.0)
MCV: 86.2 fL (ref 80.0–100.0)
MPV: 12.5 fL (ref 7.5–12.5)
Monocytes Relative: 11.9 %
Neutro Abs: 1619 cells/uL (ref 1500–7800)
Neutrophils Relative %: 50.6 %
Platelets: 170 10*3/uL (ref 140–400)
RBC: 4.92 10*6/uL (ref 3.80–5.10)
RDW: 13.6 % (ref 11.0–15.0)
Total Lymphocyte: 34.6 %
WBC: 3.2 10*3/uL — ABNORMAL LOW (ref 3.8–10.8)

## 2022-03-31 LAB — C3 AND C4
C3 Complement: 140 mg/dL (ref 83–193)
C4 Complement: 29 mg/dL (ref 15–57)

## 2022-03-31 LAB — ANTI-NUCLEAR AB-TITER (ANA TITER)
ANA TITER: 1:80 {titer} — ABNORMAL HIGH
ANA Titer 1: 1:160 {titer} — ABNORMAL HIGH

## 2022-03-31 LAB — URINALYSIS, ROUTINE W REFLEX MICROSCOPIC
Bacteria, UA: NONE SEEN /HPF
Bilirubin Urine: NEGATIVE
Glucose, UA: NEGATIVE
Hgb urine dipstick: NEGATIVE
Hyaline Cast: NONE SEEN /LPF
Ketones, ur: NEGATIVE
Nitrite: NEGATIVE
Protein, ur: NEGATIVE
RBC / HPF: NONE SEEN /HPF (ref 0–2)
Specific Gravity, Urine: 1.011 (ref 1.001–1.035)
Squamous Epithelial / HPF: NONE SEEN /HPF (ref <=5)
WBC, UA: NONE SEEN /HPF (ref 0–5)
pH: 5.5 (ref 5.0–8.0)

## 2022-03-31 LAB — MICROSCOPIC MESSAGE

## 2022-03-31 LAB — ANTI-DNA ANTIBODY, DOUBLE-STRANDED: ds DNA Ab: 3 IU/mL

## 2022-03-31 LAB — ANA: Anti Nuclear Antibody (ANA): POSITIVE — AB

## 2022-03-31 LAB — SEDIMENTATION RATE: Sed Rate: 19 mm/h (ref 0–30)

## 2022-03-31 NOTE — Progress Notes (Signed)
ANA is positive and stable.

## 2022-04-03 ENCOUNTER — Encounter: Payer: Self-pay | Admitting: Physician Assistant

## 2022-04-03 ENCOUNTER — Ambulatory Visit: Payer: Medicare PPO | Attending: Physician Assistant | Admitting: Physician Assistant

## 2022-04-03 VITALS — BP 121/80 | HR 65 | Resp 16 | Ht 63.0 in | Wt 131.2 lb

## 2022-04-03 DIAGNOSIS — Z860101 Personal history of adenomatous and serrated colon polyps: Secondary | ICD-10-CM

## 2022-04-03 DIAGNOSIS — Z87448 Personal history of other diseases of urinary system: Secondary | ICD-10-CM

## 2022-04-03 DIAGNOSIS — M3214 Glomerular disease in systemic lupus erythematosus: Secondary | ICD-10-CM | POA: Diagnosis not present

## 2022-04-03 DIAGNOSIS — Z8639 Personal history of other endocrine, nutritional and metabolic disease: Secondary | ICD-10-CM

## 2022-04-03 DIAGNOSIS — Z8719 Personal history of other diseases of the digestive system: Secondary | ICD-10-CM

## 2022-04-03 DIAGNOSIS — M25562 Pain in left knee: Secondary | ICD-10-CM

## 2022-04-03 DIAGNOSIS — Z79899 Other long term (current) drug therapy: Secondary | ICD-10-CM | POA: Diagnosis not present

## 2022-04-03 DIAGNOSIS — Z87898 Personal history of other specified conditions: Secondary | ICD-10-CM

## 2022-04-03 DIAGNOSIS — M5136 Other intervertebral disc degeneration, lumbar region: Secondary | ICD-10-CM | POA: Diagnosis not present

## 2022-04-03 DIAGNOSIS — Z862 Personal history of diseases of the blood and blood-forming organs and certain disorders involving the immune mechanism: Secondary | ICD-10-CM

## 2022-04-03 DIAGNOSIS — M25512 Pain in left shoulder: Secondary | ICD-10-CM

## 2022-04-03 DIAGNOSIS — M51369 Other intervertebral disc degeneration, lumbar region without mention of lumbar back pain or lower extremity pain: Secondary | ICD-10-CM

## 2022-04-03 DIAGNOSIS — Z86010 Personal history of colonic polyps: Secondary | ICD-10-CM

## 2022-04-03 DIAGNOSIS — M3219 Other organ or system involvement in systemic lupus erythematosus: Secondary | ICD-10-CM | POA: Diagnosis not present

## 2022-04-03 DIAGNOSIS — G8929 Other chronic pain: Secondary | ICD-10-CM

## 2022-04-03 DIAGNOSIS — M810 Age-related osteoporosis without current pathological fracture: Secondary | ICD-10-CM

## 2022-04-03 MED ORDER — AZATHIOPRINE 50 MG PO TABS
ORAL_TABLET | ORAL | 0 refills | Status: DC
Start: 2022-04-03 — End: 2022-07-31

## 2022-04-05 ENCOUNTER — Other Ambulatory Visit: Payer: Self-pay | Admitting: Family Medicine

## 2022-04-07 DIAGNOSIS — D72819 Decreased white blood cell count, unspecified: Secondary | ICD-10-CM | POA: Diagnosis not present

## 2022-04-07 DIAGNOSIS — N1831 Chronic kidney disease, stage 3a: Secondary | ICD-10-CM | POA: Diagnosis not present

## 2022-04-07 DIAGNOSIS — M3214 Glomerular disease in systemic lupus erythematosus: Secondary | ICD-10-CM | POA: Diagnosis not present

## 2022-04-07 DIAGNOSIS — I129 Hypertensive chronic kidney disease with stage 1 through stage 4 chronic kidney disease, or unspecified chronic kidney disease: Secondary | ICD-10-CM | POA: Diagnosis not present

## 2022-04-13 ENCOUNTER — Encounter: Payer: Self-pay | Admitting: Family Medicine

## 2022-04-13 ENCOUNTER — Ambulatory Visit: Payer: Medicare PPO | Admitting: Family Medicine

## 2022-04-13 VITALS — BP 125/80 | HR 79 | Ht 63.0 in | Wt 134.0 lb

## 2022-04-13 DIAGNOSIS — E782 Mixed hyperlipidemia: Secondary | ICD-10-CM | POA: Diagnosis not present

## 2022-04-13 DIAGNOSIS — M3214 Glomerular disease in systemic lupus erythematosus: Secondary | ICD-10-CM

## 2022-04-13 DIAGNOSIS — I10 Essential (primary) hypertension: Secondary | ICD-10-CM | POA: Diagnosis not present

## 2022-04-13 DIAGNOSIS — M3219 Other organ or system involvement in systemic lupus erythematosus: Secondary | ICD-10-CM | POA: Diagnosis not present

## 2022-04-13 DIAGNOSIS — R7303 Prediabetes: Secondary | ICD-10-CM | POA: Diagnosis not present

## 2022-04-13 DIAGNOSIS — Z1231 Encounter for screening mammogram for malignant neoplasm of breast: Secondary | ICD-10-CM

## 2022-04-13 NOTE — Patient Instructions (Signed)
Annual exam in July when due, call if you need me sooner  Please get fasting lipid, and HBa1C in the next 1 week, I will send a My Chart message with results   Please schedule mammogram past due at checkout, morning appt preferred  It is important that you exercise regularly at least 30 minutes 5 times a week. If you develop chest pain, have severe difficulty breathing, or feel very tired, stop exercising immediately and seek medical attention   Thanks for choosing [facility], we consider it a privelige to serve you.  \

## 2022-04-13 NOTE — Assessment & Plan Note (Signed)
Controlled, no change in medication DASH diet and commitment to daily physical activity for a minimum of 30 minutes discussed and encouraged, as a part of hypertension management. The importance of attaining a healthy weight is also discussed.     04/13/2022    3:42 PM 04/03/2022   10:19 AM 01/03/2022   11:23 AM 10/11/2021    8:45 AM 09/26/2021    1:12 PM 09/20/2021   10:22 AM 08/03/2021   10:37 AM  BP/Weight  Systolic BP 161 096 045 409 811 914 782  Diastolic BP 80 80 83 82 81 80 82  Wt. (Lbs) 134.04 131.2 131.4 130  130 130.4  BMI 23.74 kg/m2 23.24 kg/m2 23.28 kg/m2 23.03 kg/m2  23.03 kg/m2 23.1 kg/m2

## 2022-04-13 NOTE — Assessment & Plan Note (Signed)
Followed by Rheumatology, no new meds, stable

## 2022-04-13 NOTE — Assessment & Plan Note (Signed)
Patient educated about the importance of limiting  Carbohydrate intake , the need to commit to daily physical activity for a minimum of 30 minutes , and to commit weight loss. The fact that changes in all these areas will reduce or eliminate all together the development of diabetes is stressed.      Latest Ref Rng & Units 03/29/2022    7:05 AM 10/11/2021    9:36 AM 09/20/2021   10:57 AM 04/07/2021    8:38 AM 11/28/2019    8:03 AM  Diabetic Labs  HbA1c 4.8 - 5.6 %  6.1   6.1    Chol 100 - 199 mg/dL  192   204    HDL >39 mg/dL  40   47    Calc LDL 0 - 99 mg/dL  126   134    Triglycerides 0 - 149 mg/dL  146   127    Creatinine 0.50 - 1.05 mg/dL 1.24   1.17  1.24  1.35       04/13/2022    3:42 PM 04/03/2022   10:19 AM 01/03/2022   11:23 AM 10/11/2021    8:45 AM 09/26/2021    1:12 PM 09/20/2021   10:22 AM 08/03/2021   10:37 AM  BP/Weight  Systolic BP 858 850 277 412 878 676 720  Diastolic BP 80 80 83 82 81 80 82  Wt. (Lbs) 134.04 131.2 131.4 130  130 130.4  BMI 23.74 kg/m2 23.24 kg/m2 23.28 kg/m2 23.03 kg/m2  23.03 kg/m2 23.1 kg/m2       No data to display          Updated lab needed at/ before next visit.

## 2022-04-13 NOTE — Assessment & Plan Note (Signed)
Nephrology managing, stable

## 2022-04-13 NOTE — Progress Notes (Signed)
[name]     MRN: [id number]      DOB: [birth year]   HPI [name] is here for follow up and re-evaluation of chronic medical conditions, medication management and review of any available recent lab and radiology data.  Preventive health is updated, specifically  Cancer screening and Immunization.   Questions or concerns regarding consultations or procedures which the PT has had in the interim are  addressed. The PT denies any adverse reactions to current medications since the last visit.  There are no new concerns.  There are no specific complaints   ROS Denies recent fever or chills. Denies sinus pressure, nasal congestion, ear pain or sore throat. Denies chest congestion, productive cough or wheezing. Denies chest pains, palpitations and leg swelling Denies abdominal pain, nausea, vomiting,diarrhea or constipation.   Denies dysuria, frequency, hesitancy or incontinence. Denies joint pain, swelling and limitation in mobility. Denies headaches, seizures, numbness, or tingling. Denies depression, anxiety or insomnia. Denies skin break down or rash.   PE  BP 125/80   Pulse 79   Ht '5\' 3"'$  (1.6 m)   Wt 134 lb 0.6 oz (60.8 kg)   SpO2 94%   BMI 23.74 kg/m   Patient alert and oriented and in no cardiopulmonary distress.  HEENT: No facial asymmetry, EOMI,     Neck supple .  Chest: Clear to auscultation bilaterally.  CVS: S1, S2 no murmurs, no S3.Regular rate.  ABD: Soft non tender.   Ext: No edema  MS: Adequate ROM spine, shoulders, hips and knees.  Skin: Intact, no ulcerations or rash noted.  Psych: Good eye contact, normal affect. Memory intact not anxious or depressed appearing.  CNS: CN 2-12 intact, power,  normal throughout.no focal deficits noted.   Assessment & Plan  Essential hypertension Controlled, no change in medication DASH diet and commitment to daily physical activity for a minimum of 30 minutes discussed and encouraged, as a part of  hypertension management. The importance of attaining a healthy weight is also discussed.     04/13/2022    3:42 PM 04/03/2022   10:19 AM 01/03/2022   11:23 AM 10/11/2021    8:45 AM 09/26/2021    1:12 PM 09/20/2021   10:22 AM 08/03/2021   10:37 AM  BP/Weight  Systolic BP 778 242 353 614 431 540 086  Diastolic BP 80 80 83 82 81 80 82  Wt. (Lbs) 134.04 131.2 131.4 130  130 130.4  BMI 23.74 kg/m2 23.24 kg/m2 23.28 kg/m2 23.03 kg/m2  23.03 kg/m2 23.1 kg/m2       Hyperlipidemia Hyperlipidemia:Low fat diet discussed and encouraged.   Lipid Panel  Lab Results  Component Value Date   CHOL 192 10/11/2021   HDL 40 10/11/2021   LDLCALC 126 (H) 10/11/2021   TRIG 146 10/11/2021   CHOLHDL 4.8 (H) 10/11/2021     Updated lab needed at/ before next visit.   Prediabetes Patient educated about the importance of limiting  Carbohydrate intake , the need to commit to daily physical activity for a minimum of 30 minutes , and to commit weight loss. The fact that changes in all these areas will reduce or eliminate all together the development of diabetes is stressed.      Latest Ref Rng & Units 03/29/2022    7:05 AM 10/11/2021    9:36 AM 09/20/2021   10:57 AM 04/07/2021    8:38 AM 11/28/2019    8:03 AM  Diabetic Labs  HbA1c 4.8 - 5.6 %  6.1   6.1    Chol 100 - 199 mg/dL  192   204    HDL >39 mg/dL  40   47    Calc LDL 0 - 99 mg/dL  126   134    Triglycerides 0 - 149 mg/dL  146   127    Creatinine 0.50 - 1.05 mg/dL 1.24   1.17  1.24  1.35       04/13/2022    3:42 PM 04/03/2022   10:19 AM 01/03/2022   11:23 AM 10/11/2021    8:45 AM 09/26/2021    1:12 PM 09/20/2021   10:22 AM 08/03/2021   10:37 AM  BP/Weight  Systolic BP 023 343 568 616 837 290 211  Diastolic BP 80 80 83 82 81 80 82  Wt. (Lbs) 134.04 131.2 131.4 130  130 130.4  BMI 23.74 kg/m2 23.24 kg/m2 23.28 kg/m2 23.03 kg/m2  23.03 kg/m2 23.1 kg/m2       No data to display          Updated lab needed at/ before next  visit.   Systemic lupus erythematosus ([place]) Followed by Rheumatology, no new meds, stable  SLE glomerulonephritis syndrome [facility]) Nephrology managing, stable

## 2022-04-13 NOTE — Assessment & Plan Note (Signed)
Hyperlipidemia:Low fat diet discussed and encouraged.   Lipid Panel  Lab Results  Component Value Date   CHOL 192 10/11/2021   HDL 40 10/11/2021   LDLCALC 126 (H) 10/11/2021   TRIG 146 10/11/2021   CHOLHDL 4.8 (H) 10/11/2021     Updated lab needed at/ before next visit.

## 2022-04-21 DIAGNOSIS — R7303 Prediabetes: Secondary | ICD-10-CM | POA: Diagnosis not present

## 2022-04-21 DIAGNOSIS — E782 Mixed hyperlipidemia: Secondary | ICD-10-CM | POA: Diagnosis not present

## 2022-04-21 IMAGING — MR MR LUMBAR SPINE W/O CM
5 series · 31 of 48 positions shown · non-contrast
Comparison: MRI lumbar spine 02/16/2010

CLINICAL DATA: Low back pain with right leg pain

EXAM:
MRI LUMBAR SPINE WITHOUT CONTRAST
TECHNIQUE: Multiplanar, multisequence MR imaging of the lumbar spine was
performed. No intravenous contrast was administered.

[Series 9: T2 · sagittal · 4.0mm · 0.68mm/px · 7 of 15 slices shown (1 of 2)]
[im 1/15]
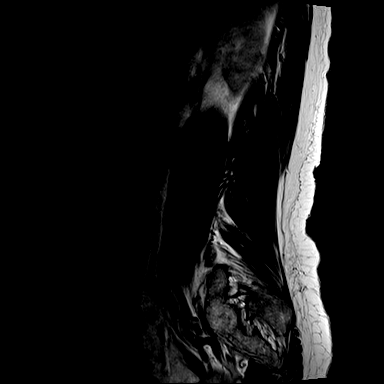
[im 3/15]
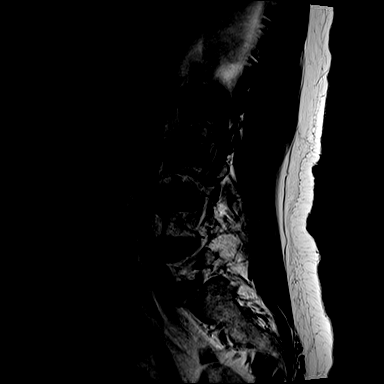
[im 5/15]
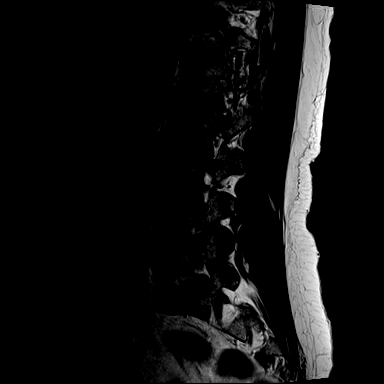
[im 8/15]
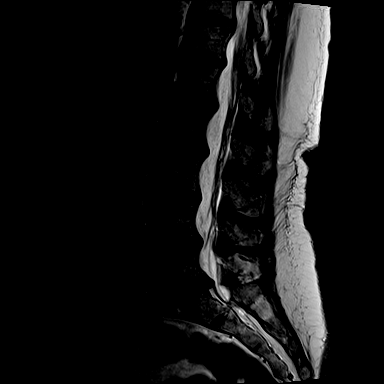
[im 10/15]
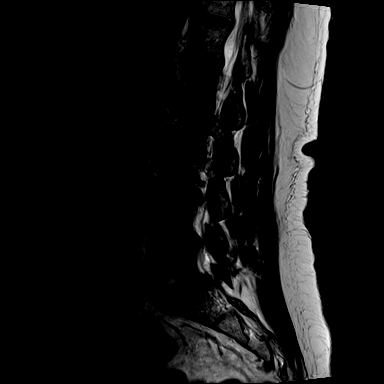
[im 12/15]
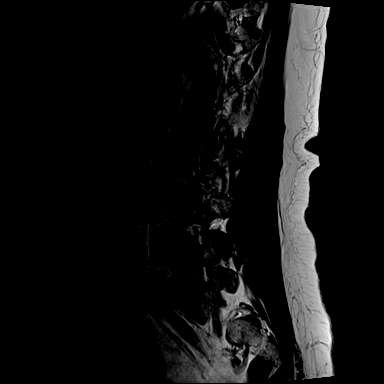
[im 15/15]
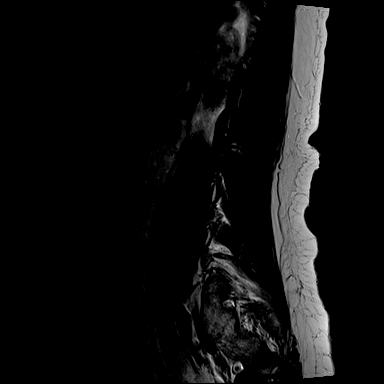

[Series 10: T1 · sagittal · 4.0mm · 0.81mm/px · 7 of 15 slices shown (1 of 2)]
[im 1/15]
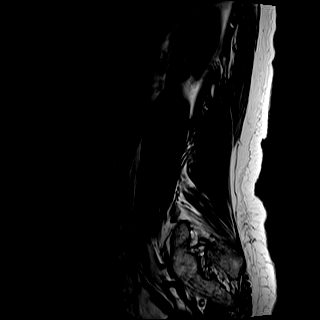
[im 3/15]
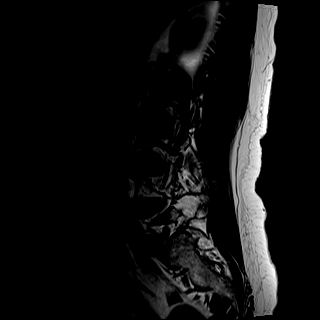
[im 5/15]
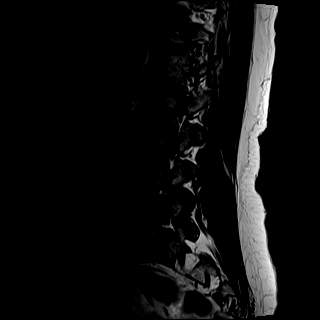
[im 8/15]
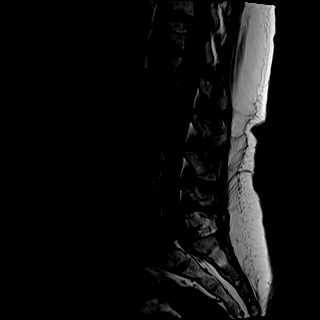
[im 10/15]
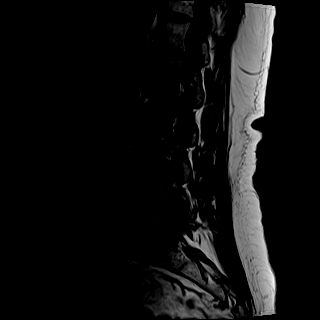
[im 12/15]
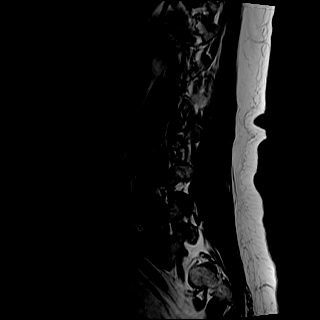
[im 15/15]
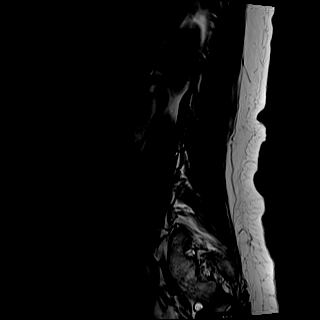

[Series 11: STIR · sagittal · 4.0mm · 0.51mm/px · 1 of 15 slices shown]
[im 1/15]
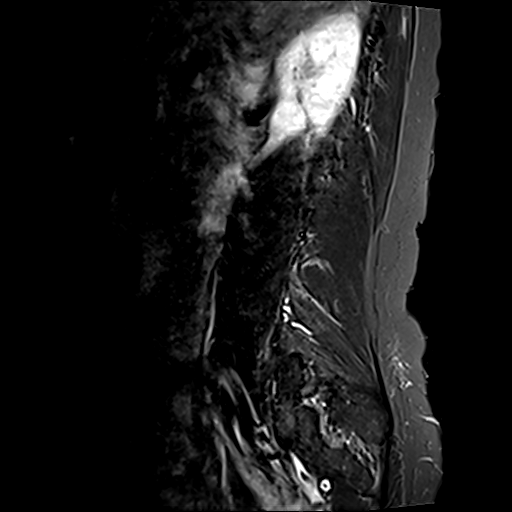

[Series 12: T2 · axial · 4.0mm · 0.70mm/px · z∈[-419,-226]mm · 8 of 33 slices shown (2 of 2)]
[im 1/33]
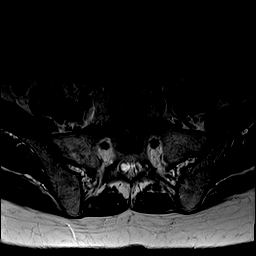
[im 5/33]
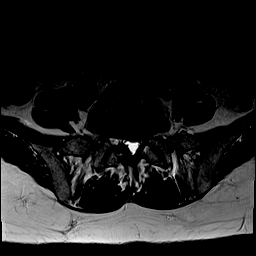
[im 10/33]
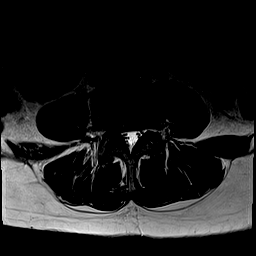
[im 15/33]
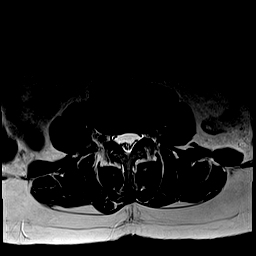
[im 18/33]
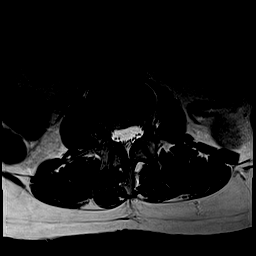
[im 23/33]
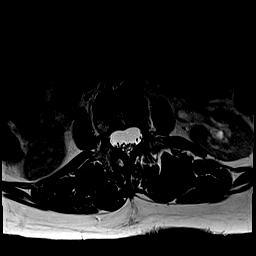
[im 28/33]
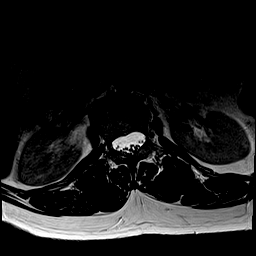
[im 33/33]
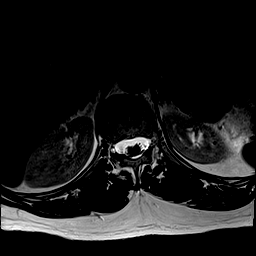

[Series 13: T1 · axial · 4.0mm · 0.35mm/px · z∈[-419,-226]mm · 8 of 33 slices shown (2 of 2)]
[im 1/33]
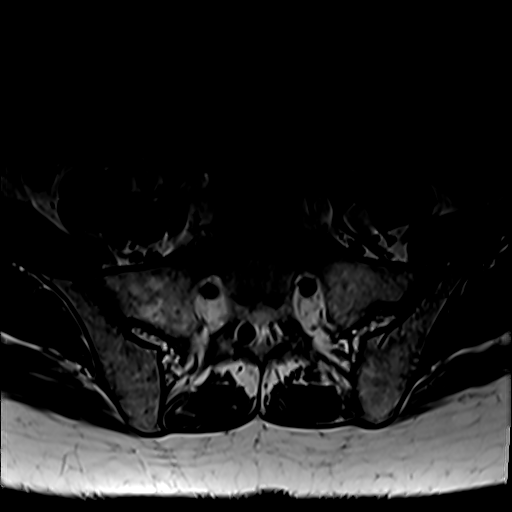
[im 5/33]
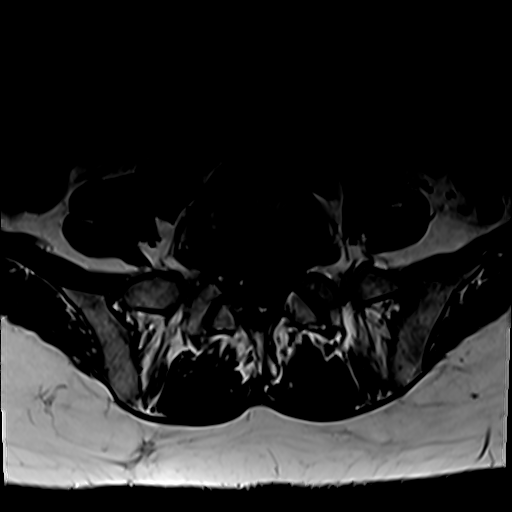
[im 10/33]
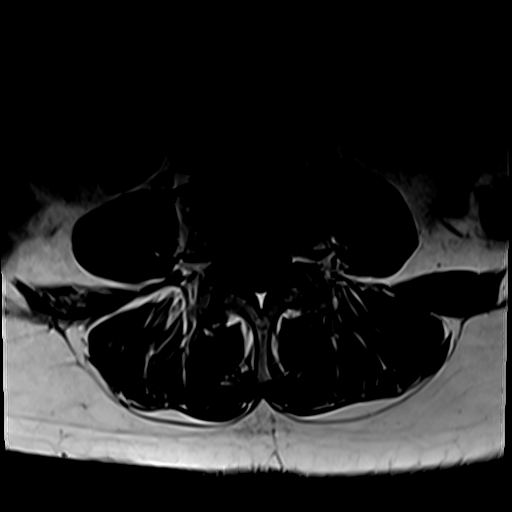
[im 15/33]
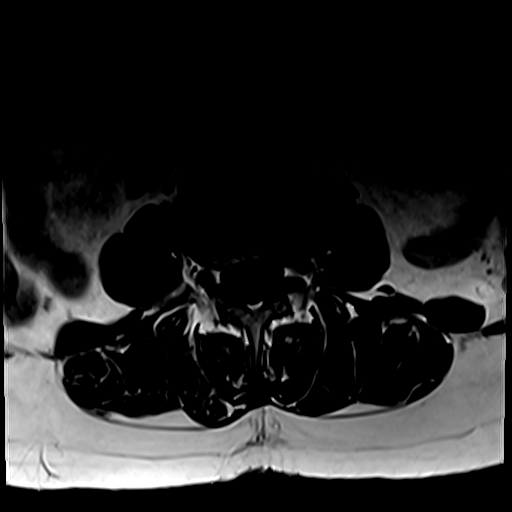
[im 18/33]
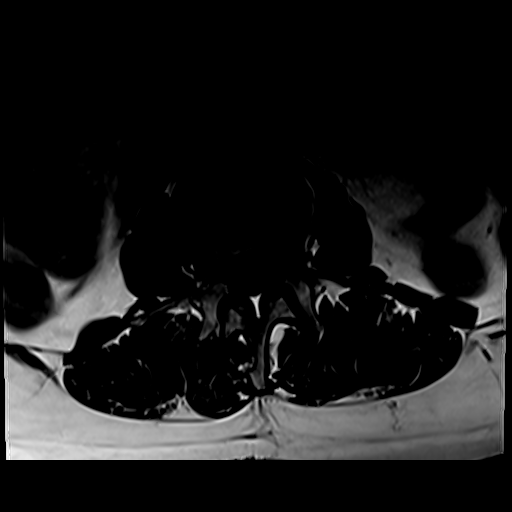
[im 23/33]
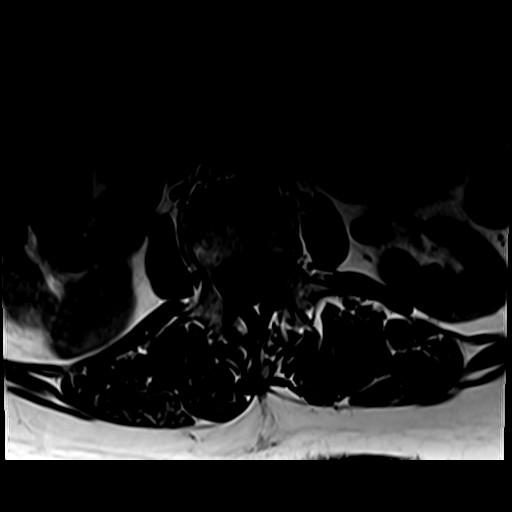
[im 28/33]
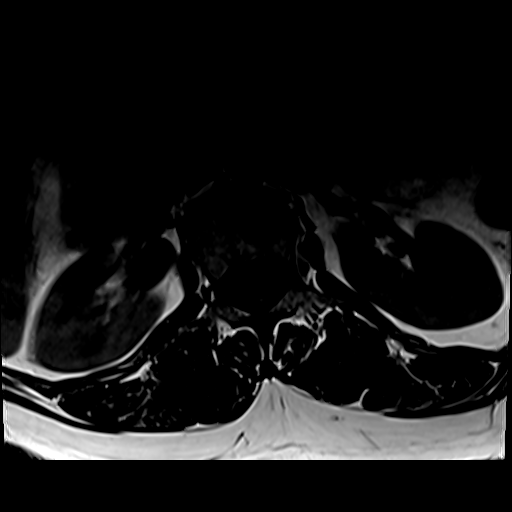
[im 33/33]
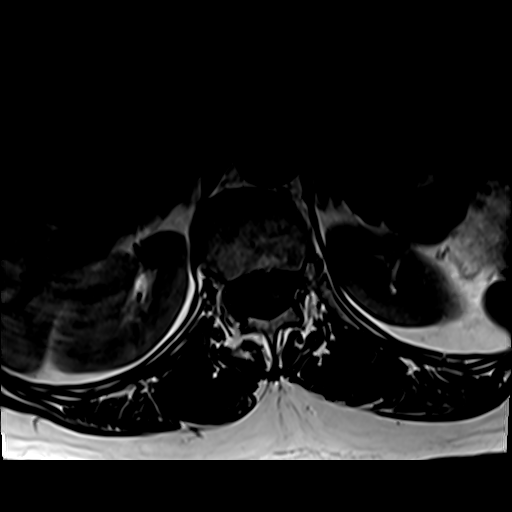

[31 of 48 positions shown; findings below may reference images not displayed]

FINDINGS: Segmentation:  5 lumbar vertebra.  Lowest disc space L5-S1.

Alignment:  Normal

Vertebrae:  Negative for fracture or mass.

Conus medullaris and cauda equina: Conus extends to the L1-2 level.
Conus and cauda equina appear normal.

Paraspinal and other soft tissues: Negative for paraspinous mass or
adenopathy.

Disc levels:

T11-12: Moderately large central disc protrusion with cord
flattening. Sagittal images only through this level. This level was
not covered on the prior MRI.

T12-L1: Mild disc bulging.  Negative for stenosis

L1-2: Mild disc bulging.  Negative for stenosis

L2-3: Mild disc bulging.  Negative for stenosis.

L3-4: Diffuse bulging of the disc without significant spinal
stenosis. Neural foramina patent bilaterally

L4-5: Disc degeneration with diffuse disc bulging. Right foraminal
disc protrusion shows mild improvement from the prior study.
Moderate subarticular stenosis on the right remains unchanged. Mild
spinal stenosis

L5-S1: Left paracentral disc protrusion and compression of left S1
nerve root similar the prior study. Bilateral facet hypertrophy
further contributes to subarticular stenosis bilaterally.
IMPRESSION: Right foraminal disc protrusion shows mild improvement since 0266.
Moderate right subarticular stenosis unchanged

Left paracentral disc protrusion L5-S1 unchanged with left S1 nerve
root impingement similar to prior study.

Moderate large central disc protrusion T11-12 with mild spinal
stenosis

## 2022-04-22 LAB — HEMOGLOBIN A1C
Est. average glucose Bld gHb Est-mCnc: 131 mg/dL
Hgb A1c MFr Bld: 6.2 % — ABNORMAL HIGH (ref 4.8–5.6)

## 2022-04-22 LAB — LIPID PANEL
Chol/HDL Ratio: 4.5 ratio — ABNORMAL HIGH (ref 0.0–4.4)
Cholesterol, Total: 189 mg/dL (ref 100–199)
HDL: 42 mg/dL (ref >39)
LDL Chol Calc (NIH): 127 mg/dL — ABNORMAL HIGH (ref 0–99)
Triglycerides: 111 mg/dL (ref 0–149)
VLDL Cholesterol Cal: 20 mg/dL (ref 5–40)

## 2022-04-24 ENCOUNTER — Ambulatory Visit (HOSPITAL_COMMUNITY)
Admission: RE | Admit: 2022-04-24 | Discharge: 2022-04-24 | Disposition: A | Discharge status: Home / Self Care | Payer: Medicare PPO | Source: Ambulatory Visit | Attending: Family Medicine | Admitting: Family Medicine

## 2022-04-24 DIAGNOSIS — Z1231 Encounter for screening mammogram for malignant neoplasm of breast: Secondary | ICD-10-CM | POA: Insufficient documentation

## 2022-05-25 ENCOUNTER — Encounter: Payer: Self-pay | Admitting: Radiology

## 2022-06-20 NOTE — Progress Notes (Deleted)
Office Visit Note  Patient: [name]             Date of Birth: October 27, 1950           MRN: [id number]             PCP: [name], MD Referring: [name], MD Visit Date: 07/04/2022 Occupation: @GUAROCC @  Subjective:  No chief complaint on file.   History of Present Illness: [name] is a 72 y.o. female ***     Activities of Daily Living:  Patient reports morning stiffness for *** {minute/hour:19697}.   Patient {ACTIONS;DENIES/REPORTS:21021675::"Denies"} nocturnal pain.  Difficulty dressing/grooming: {ACTIONS;DENIES/REPORTS:21021675::"Denies"} Difficulty climbing stairs: {ACTIONS;DENIES/REPORTS:21021675::"Denies"} Difficulty getting out of chair: {ACTIONS;DENIES/REPORTS:21021675::"Denies"} Difficulty using hands for taps, buttons, cutlery, and/or writing: {ACTIONS;DENIES/REPORTS:21021675::"Denies"}  No Rheumatology ROS completed.   PMFS History:  Patient Active Problem List   Diagnosis Date Noted   Encounter for annual physical exam 10/11/2021   Cervical spondylosis 06/12/2021   Chronic low back pain with left-sided sciatica 04/11/2021   Baker's cyst of knee, left 11/26/2020   Seborrheic dermatitis of scalp 07/19/2020   Essential hypertension 07/19/2020   Overweight (BMI 25.0-29.9) 07/19/2020   Systemic lupus erythematosus arthritis ([place]) 02/12/2020   Allergic sinusitis 11/04/2018   Hyperlipidemia 03/08/2017   Proteinuria 03/08/2017   SLE glomerulonephritis syndrome ([place]) 09/15/2016   High risk medication use 09/15/2016   Degeneration of lumbar intervertebral disc 09/15/2016   Personal history of other endocrine, nutritional and metabolic disease [id number]   History of anemia 09/15/2016   History of proteinuria  09/15/2016   Tubular adenoma of colon 04/30/2016   Prediabetes 11/15/2014   Cervicalgia 05/11/2014   Osteopenia 07/21/2013   Anemia 12/22/2012   Systemic lupus erythematosus ([place]) 02/15/2012   H/O: gastrointestinal  disease 10/01/2007    Past Medical History:  Diagnosis Date   Arthritis 02/13/2010   abnormal MRI lumbar and c spine  with disc disease   History of anemia    History of diverticulosis    History of prediabetes    Osteoporosis    SLE (systemic lupus erythematosus) ([place]) dx apprx 1990   reports immunotherapy, cytoxan and steroids at [facility], no f/u for over 33yrs    Family History  Problem Relation Age of Onset   Heart disease Father    Cancer Brother 43       breast   Breast cancer Brother    Cancer Sister 68       breast   Breast cancer Sister    Colon cancer Neg Hx    Past Surgical History:  Procedure Laterality Date   ABDOMINAL HYSTERECTOMY  unsure, over 10 yrs   partial, has had abnormal pap   BLADDER REPAIR     COLONOSCOPY N/A 01/05/2014   Procedure: COLONOSCOPY;  Surgeon: Daneil Dolin, MD;  Location: [facility];  Service: Endoscopy;  Laterality: N/A;  12:15 PM   COLONOSCOPY N/A 07/07/2020   Procedure: COLONOSCOPY;  Surgeon: Daneil Dolin, MD;  Location: [facility];  Service: Endoscopy;  Laterality: N/A;  ASA II / AM procedure   Social History   Social History Narrative   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   Immunization History  Administered Date(s) Administered   Fluad Quad(high Dose 65+) 11/18/2018, 01/05/2020, 11/26/2020   Influenza Split 02/14/2012   Influenza Whole 12/28/2000   Influenza,inj,Quad PF,6+ Mos 12/16/2012, 01/06/2014, 01/19/2016, 12/10/2017   Influenza-Unspecified 12/27/2021   Moderna SARS-COV2 Booster Vaccination 04/20/2021   Moderna Sars-Covid-2 Vaccination  05/02/2019, 05/31/2019, 01/29/2020, 10/04/2020   Pneumococcal Conjugate-13 09/25/2017   Pneumococcal Polysaccharide-23 09/30/2018   Td 09/17/2003   Tdap 01/06/2014   Zoster, Live 12/17/2012     Objective: Vital Signs: There were no vitals taken for this visit.   Physical Exam   Musculoskeletal Exam: ***  CDAI Exam: CDAI Score: -- Patient Global: --;  Provider Global: -- Swollen: --; Tender: -- Joint Exam 07/04/2022   No joint exam has been documented for this visit   There is currently no information documented on the homunculus. Go to the Rheumatology activity and complete the homunculus joint exam.  Investigation: No additional findings.  Imaging: No results found.  Recent Labs: Lab Results  Component Value Date   WBC 3.2 (L) 03/29/2022   HGB 13.8 03/29/2022   PLT 170 03/29/2022   NA 139 03/29/2022   K 4.6 03/29/2022   CL 104 03/29/2022   CO2 29 03/29/2022   GLUCOSE 91 03/29/2022   BUN 21 03/29/2022   CREATININE 1.24 (H) 03/29/2022   BILITOT 0.5 03/29/2022   ALKPHOS 43 (L) 04/07/2021   AST 30 03/29/2022   ALT 20 03/29/2022   PROT 7.9 03/29/2022   ALBUMIN 4.0 04/07/2021   CALCIUM 9.7 03/29/2022   GFRAA 47 (L) 11/28/2019   QFTBGOLDPLUS NEGATIVE 08/03/2021    Speciality Comments: Plaquenil eye exam normal on 05/26/2022 [facility] f/u 6 months  Procedures:  No procedures performed Allergies: Sulfa antibiotics and Sulfonamide derivatives   Assessment / Plan:     Visit Diagnoses: Other systemic lupus erythematosus with other organ involvement (HCC)  Lupus nephritis ([place])  High risk medication use  Chronic left shoulder pain  Chronic pain of left knee  DDD (degenerative disc disease), lumbar  Age-related osteoporosis without current pathological fracture  History of hyperlipidemia  History of proteinuria syndrome  History of prediabetes  History of diverticulosis  History of anemia  History of renal insufficiency  History of adenomatous polyp of colon  Effusion, left knee  Orders: No orders of the defined types were placed in this encounter.  No orders of the defined types were placed in this encounter.   Face-to-face time spent with patient was *** minutes. Greater than 50% of time was spent in counseling and coordination of care.  Follow-Up Instructions: No follow-ups on  file.   Ofilia Neas, PA-C  Note - This record has been created using Dragon software.  Chart creation errors have been sought, but may not always  have been located. Such creation errors do not reflect on  the standard of medical care.

## 2022-06-29 ENCOUNTER — Other Ambulatory Visit: Payer: Self-pay | Admitting: Physician Assistant

## 2022-06-29 DIAGNOSIS — M3219 Other organ or system involvement in systemic lupus erythematosus: Secondary | ICD-10-CM

## 2022-06-29 NOTE — Telephone Encounter (Signed)
Last Fill: 03/22/2022  Eye exam: 05/26/2022 WNL   Labs: 03/29/2022 Creatinine elevated-1.24.  GFR low 47. WBC count is low-3.2. rest of CBC WNL.     Next Visit: 07/04/2022  Last Visit: 04/03/2022  DX: Other systemic lupus erythematosus with other organ involvement   Current Dose per office note 04/03/2022: plaquenil 200 mg 1 tablet by mouth twice daily M-F.   Okay to refill Plaquenil?

## 2022-07-04 ENCOUNTER — Ambulatory Visit: Payer: Medicare PPO | Admitting: Physician Assistant

## 2022-07-04 DIAGNOSIS — Z87448 Personal history of other diseases of urinary system: Secondary | ICD-10-CM

## 2022-07-04 DIAGNOSIS — G8929 Other chronic pain: Secondary | ICD-10-CM

## 2022-07-04 DIAGNOSIS — M3219 Other organ or system involvement in systemic lupus erythematosus: Secondary | ICD-10-CM

## 2022-07-04 DIAGNOSIS — Z8719 Personal history of other diseases of the digestive system: Secondary | ICD-10-CM

## 2022-07-04 DIAGNOSIS — Z8639 Personal history of other endocrine, nutritional and metabolic disease: Secondary | ICD-10-CM

## 2022-07-04 DIAGNOSIS — M5136 Other intervertebral disc degeneration, lumbar region: Secondary | ICD-10-CM

## 2022-07-04 DIAGNOSIS — M810 Age-related osteoporosis without current pathological fracture: Secondary | ICD-10-CM

## 2022-07-04 DIAGNOSIS — Z87898 Personal history of other specified conditions: Secondary | ICD-10-CM

## 2022-07-04 DIAGNOSIS — Z86010 Personal history of colonic polyps: Secondary | ICD-10-CM

## 2022-07-04 DIAGNOSIS — Z79899 Other long term (current) drug therapy: Secondary | ICD-10-CM

## 2022-07-04 DIAGNOSIS — M25462 Effusion, left knee: Secondary | ICD-10-CM

## 2022-07-04 DIAGNOSIS — M3214 Glomerular disease in systemic lupus erythematosus: Secondary | ICD-10-CM

## 2022-07-04 DIAGNOSIS — Z862 Personal history of diseases of the blood and blood-forming organs and certain disorders involving the immune mechanism: Secondary | ICD-10-CM

## 2022-07-18 NOTE — Progress Notes (Signed)
Office Visit Note  Patient: [name]             Date of Birth: 1950/09/29           MRN: [id number]             PCP: Kerri Perches, MD Referring: Kerri Perches, MD Visit Date: 08/01/2022 Occupation: @GUAROCC @  Subjective:  Left knee pain and swelling   History of Present Illness: BERDEAN THIELEMANN is a 72 y.o. female with history of systemic lupus, lupus nephritis, and osteoporosis.  Patient remains on Imuran 50 mg 1 tablet daily and plaquenil 200 mg 1 tablet by mouth twice daily M-F. She is tolerating both medications without any side effects.   She has not missed any doses recently. She denies any signs or symptoms of a systemic lupus flare.  She had updated lab work with Dr. Wolfgang Phoenix yesterday on 07/31/22. She states she has been experiencing recurrence of stiffness and soreness in the left knee joint for the past 2 months.  She denies any recent injury or fall.  She had a left knee joint cortisone injection on 01/03/2022 which righted significant relief until about 2 months ago.  She states that she has been able to walk and run without difficulty.  She states that after sitting or lying down she experiences some stiffness with the first few steps which improves with activity.  She has not noticed any increased warmth or swelling in the left knee joint.  At times she has a locking sensation.  She takes tylenol sparingly for pain relief.  She denies any other joint pain or joint swelling at this time.  She denies any symptoms of Raynaud's phenomenon, oral or nasal ulcerations, recent rashes, or cervical lymphadenopathy. She remains on fosamax 70 mg 1 tablet by mouth once weekly and is taking a calcium and vitamin D supplement daily.  She denies any recent falls or fractures.  She denies any recent or recurrent infections.     Activities of Daily Living:  Patient reports morning stiffness for 5-10 minutes.   Patient Denies nocturnal pain.  Difficulty dressing/grooming:  Denies Difficulty climbing stairs: Denies Difficulty getting out of chair: Denies Difficulty using hands for taps, buttons, cutlery, and/or writing: Denies  Review of Systems  Constitutional:  Negative for fatigue.  HENT:  Positive for mouth dryness. Negative for mouth sores.   Eyes:  Negative for dryness.  Respiratory:  Negative for shortness of breath.   Cardiovascular:  Negative for chest pain and palpitations.  Gastrointestinal:  Positive for constipation. Negative for blood in stool and diarrhea.  Endocrine: Negative for increased urination.  Genitourinary:  Negative for involuntary urination.  Musculoskeletal:  Positive for joint pain, joint pain, joint swelling and morning stiffness. Negative for gait problem, myalgias, muscle weakness, muscle tenderness and myalgias.  Skin:  Negative for color change, rash, hair loss and sensitivity to sunlight.  Allergic/Immunologic: Negative for susceptible to infections.  Neurological:  Negative for dizziness and headaches.  Hematological:  Negative for swollen glands.  Psychiatric/Behavioral:  Negative for depressed mood and sleep disturbance. The patient is not nervous/anxious.     PMFS History:  Patient Active Problem List   Diagnosis Date Noted   Encounter for annual physical exam 10/11/2021   Cervical spondylosis 06/12/2021   Chronic low back pain with left-sided sciatica 04/11/2021   Baker's cyst of knee, left 11/26/2020   Seborrheic dermatitis of scalp 07/19/2020   Essential hypertension 07/19/2020   Overweight (BMI 25.0-29.9)  07/19/2020   Systemic lupus erythematosus arthritis (HCC) 02/12/2020   Allergic sinusitis 11/04/2018   Hyperlipidemia 03/08/2017   Proteinuria 03/08/2017   SLE glomerulonephritis syndrome (HCC) 09/15/2016   High risk medication use 09/15/2016   Degeneration of lumbar intervertebral disc 09/15/2016   Personal history of other endocrine, nutritional and metabolic disease 16/12/9602   History of anemia  09/15/2016   History of proteinuria  09/15/2016   Tubular adenoma of colon 04/30/2016   Prediabetes 11/15/2014   Cervicalgia 05/11/2014   Osteopenia 07/21/2013   Anemia 12/22/2012   Systemic lupus erythematosus (HCC) 02/15/2012   H/O: gastrointestinal disease 10/01/2007    Past Medical History:  Diagnosis Date   Arthritis 02/13/2010   abnormal MRI lumbar and c spine  with disc disease   History of anemia    History of diverticulosis    History of prediabetes    Osteoporosis    SLE (systemic lupus erythematosus) (HCC) dx apprx 1990   reports immunotherapy, cytoxan and steroids at [facility], no f/u for over 75yrs    Family History  Problem Relation Age of Onset   Heart disease Father    Cancer Brother 33       breast   Breast cancer Brother    Cancer Sister 62       breast   Breast cancer Sister    Colon cancer Neg Hx    Past Surgical History:  Procedure Laterality Date   ABDOMINAL HYSTERECTOMY  unsure, over 10 yrs   partial, has had abnormal pap   BLADDER REPAIR     COLONOSCOPY N/A 01/05/2014   Procedure: COLONOSCOPY;  Surgeon: Corbin Ade, MD;  Location: [facility];  Service: Endoscopy;  Laterality: N/A;  12:15 PM   COLONOSCOPY N/A 07/07/2020   Procedure: COLONOSCOPY;  Surgeon: Corbin Ade, MD;  Location: [facility];  Service: Endoscopy;  Laterality: N/A;  ASA II / AM procedure   Social History   Social History Narrative   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   1 grandchild   Immunization History  Administered Date(s) Administered   Fluad Quad(high Dose 65+) 11/18/2018, 01/05/2020, 11/26/2020   Influenza Split 02/14/2012   Influenza Whole 12/28/2000   Influenza,inj,Quad PF,6+ Mos 12/16/2012, 01/06/2014, 01/19/2016, 12/10/2017   Influenza-Unspecified 12/27/2021   Moderna SARS-COV2 Booster Vaccination 04/20/2021   Moderna Sars-Covid-2 Vaccination 05/02/2019, 05/31/2019, 01/29/2020, 10/04/2020   Pneumococcal Conjugate-13 09/25/2017   Pneumococcal  Polysaccharide-23 09/30/2018   Td 09/17/2003   Tdap 01/06/2014   Zoster, Live 12/17/2012     Objective: Vital Signs: BP 129/77 (BP Location: Left Arm, Patient Position: Sitting, Cuff Size: Normal)   Pulse 61   Resp 13   Ht 5\' 3"  (1.6 m)   Wt 135 lb 3.2 oz (61.3 kg)   BMI 23.95 kg/m    Physical Exam Vitals and nursing note reviewed.  Constitutional:      Appearance: She is well-developed.  HENT:     Head: Normocephalic and atraumatic.  Eyes:     Conjunctiva/sclera: Conjunctivae normal.  Cardiovascular:     Rate and Rhythm: Normal rate and regular rhythm.     Heart sounds: Normal heart sounds.  Pulmonary:     Effort: Pulmonary effort is normal.     Breath sounds: Normal breath sounds.  Abdominal:     General: Bowel sounds are normal.     Palpations: Abdomen is soft.  Musculoskeletal:     Cervical back: Normal range of motion.  Lymphadenopathy:     Cervical: No cervical adenopathy.  Skin:    General: Skin is warm and dry.     Capillary Refill: Capillary refill takes less than 2 seconds.  Neurological:     Mental Status: She is alert and oriented to person, place, and time.  Psychiatric:        Behavior: Behavior normal.      Musculoskeletal Exam: C-spine, thoracic spine, and lumbar spine good ROM.  Shoulder joints, elbow joints, wrist joints, MCPs, PIPs, and DIPs good ROM with no synovitis.  Complete fist formation bilaterally.  Hip joints have good ROM with no groin pain.  Knee joints have good ROM with no warmth or effusion.  Mild knee crepitus noted.  Some discomfort with ROM of the left knee.  Ankle joints have good ROM with no tenderness or joint swelling.    CDAI Exam: CDAI Score: -- Patient Global: --; Provider Global: -- Swollen: --; Tender: -- Joint Exam 08/01/2022   No joint exam has been documented for this visit   There is currently no information documented on the homunculus. Go to the Rheumatology activity and complete the homunculus joint  exam.  Investigation: No additional findings.  Imaging: No results found.  Recent Labs: Lab Results  Component Value Date   WBC 3.7 (L) 07/31/2022   HGB 12.5 07/31/2022   PLT 157 07/31/2022   NA 136 07/31/2022   K 4.4 07/31/2022   CL 103 07/31/2022   CO2 26 07/31/2022   GLUCOSE 92 07/31/2022   BUN 20 07/31/2022   CREATININE 1.20 (H) 07/31/2022   BILITOT 0.5 03/29/2022   ALKPHOS 43 (L) 04/07/2021   AST 30 03/29/2022   ALT 20 03/29/2022   PROT 7.9 03/29/2022   ALBUMIN 3.7 07/31/2022   CALCIUM 9.0 07/31/2022   GFRAA 47 (L) 11/28/2019   QFTBGOLDPLUS NEGATIVE 08/03/2021    Speciality Comments: Plaquenil eye exam normal on 05/26/2022 [facility] f/u 6 months  Procedures:  No procedures performed Allergies: Sulfa antibiotics and Sulfonamide derivatives    Assessment / Plan:     Visit Diagnoses: Other systemic lupus erythematosus with other organ involvement (HCC) - History of dsDNA +64, Smith + 5.5, RNP+ 6.5, SSA([place]) Negative, SSB ([place]) Negative, presented initially with nephritis, hair loss, arthralgias, dermatitis: She has not had any signs or symptoms of a systemic lupus flare.  She has clinically been doing well taking Imuran 50 mg 1 tablet by mouth daily and Plaquenil 200 mg 1 tablet by mouth twice daily Monday through Friday.  She is tolerating combination therapy without any side effects and has not missed any doses recently.  She has no synovitis on examination today.  No symptoms of Raynaud's phenomenon.  No oral or nasal ulcerations.  No cervical lymphadenopathy was noted.  She has not had any recent rashes.  Discussed the importance of avoiding direct sun exposure and wearing sunscreen SPF 50 on a daily basis and reapplying every 1-2 hours if she is outdoors.  She has not been experiencing any shortness of breath, pleuritic chest pain, or palpitations.  Her energy level has been stable overall. Lab work from 03/29/22 was reviewed today in the office: ANA 1:160NH, 1:80  cytoplasmic, no proteinuria, dsDNA negative-3, complements WNL.  CBC, CMP, and protein creatinine ratio were updated yesterday on 07/31/2022 ordered by her nephrologist Dr. Wolfgang Phoenix.  Discussed the importance of having updated autoimmune lab work along with routine lab work every 3 months.  She will remain on Plaquenil and Imuran as prescribed.  She was advised to notify us if  she develops signs or symptoms of a flare.  She will follow-up in the office in 3 months or sooner if needed.  Lupus nephritis (HCC) - Treated with Cytoxan IV and prednisone for 1 year.  Followed closely by Dr. Wolfgang Phoenix every 3 months.  Patient had recent lab work obtained yesterday on 07/31/2022 at which time she had no proteinuria.  She will remain on Imuran and plaquenil.   High risk medication use - Imuran 50 mg 1 tablet daily and plaquenil 200 mg 1 tablet by mouth twice daily M-F. Plaquenil eye exam normal on 05/26/2022 [facility] f/u 6 months  CBC and CMP updated on 07/31/22: Creatinine was 1.20 and GFR was 49, white blood cell count was 3.7, hemoglobin 12.5, and platelets 157K, and no proteinuria.   She will continue to have updated lab work every 3 months with her nephrologist Dr. Wolfgang Phoenix.   No recent or recurrent infections.    Chronic left shoulder pain: Good range of motion of the left shoulder joint with no discomfort at this time.  Chronic pain of left knee: X-rays of the left knee were updated on 12/01/2020.  No warmth or effusion noted today.  Discomfort and mild crepitus noted. Patient had a left knee joint cortisone injection on 01/03/2022 which provided significant relief.  Her symptoms started to recur about 2 months ago to the point she is having some increased soreness and stiffness after sitting or lying for prolonged periods of time.  At times she notices a locking sensation.  She has not had any recent fall or buckling.  Patient declined proceeding with an MRI at this time.  She declined a cortisone injection  currently.  Discussed that she can use a compression sleeve or ice after exercise.  Her knee joint pain has not been interfering with her exercise regimen so she would like to hold off on another cortisone injection at this time.  She will notify us if her symptoms persist or worsen.  DDD (degenerative disc disease), lumbar: She is not experiencing any increased discomfort in her lower back.  No midline spinal tenderness or SI joint tenderness upon palpation today.  Age-related osteoporosis without current pathological fracture - DEXA 01/19/2020: The BMD measured at Forearm Radius 33% is 0.549 g/cm2 with a T-score of -2.3.  However due to update DEXA.  She remains on Fosamax 70 mg weekly and is taking calcium vitamin D supplement daily.  No recent falls or fractures.  Other medical conditions are listed as follows:  History of hyperlipidemia  History of proteinuria syndrome:  History of prediabetes  History of diverticulosis  History of anemia  History of renal insufficiency  History of adenomatous polyp of colon  Orders: No orders of the defined types were placed in this encounter.  No orders of the defined types were placed in this encounter.   Follow-Up Instructions: Return in about 3 months (around 11/01/2022) for Systemic lupus erythematosus.   Gearldine Bienenstock, PA-C  Note - This record has been created using Dragon software.  Chart creation errors have been sought, but may not always  have been located. Such creation errors do not reflect on  the standard of medical care.

## 2022-07-31 ENCOUNTER — Other Ambulatory Visit: Payer: Self-pay | Admitting: Physician Assistant

## 2022-07-31 ENCOUNTER — Other Ambulatory Visit (HOSPITAL_COMMUNITY)
Admission: RE | Admit: 2022-07-31 | Discharge: 2022-07-31 | Disposition: A | Discharge status: Home / Self Care | Payer: Medicare PPO | Source: Ambulatory Visit | Attending: Nephrology | Admitting: Nephrology

## 2022-07-31 DIAGNOSIS — N1831 Chronic kidney disease, stage 3a: Secondary | ICD-10-CM | POA: Diagnosis not present

## 2022-07-31 DIAGNOSIS — D631 Anemia in chronic kidney disease: Secondary | ICD-10-CM | POA: Insufficient documentation

## 2022-07-31 DIAGNOSIS — M3214 Glomerular disease in systemic lupus erythematosus: Secondary | ICD-10-CM | POA: Insufficient documentation

## 2022-07-31 DIAGNOSIS — E559 Vitamin D deficiency, unspecified: Secondary | ICD-10-CM | POA: Diagnosis not present

## 2022-07-31 DIAGNOSIS — R7303 Prediabetes: Secondary | ICD-10-CM | POA: Diagnosis not present

## 2022-07-31 DIAGNOSIS — Z79899 Other long term (current) drug therapy: Secondary | ICD-10-CM | POA: Insufficient documentation

## 2022-07-31 DIAGNOSIS — R809 Proteinuria, unspecified: Secondary | ICD-10-CM | POA: Diagnosis not present

## 2022-07-31 DIAGNOSIS — D72819 Decreased white blood cell count, unspecified: Secondary | ICD-10-CM | POA: Diagnosis not present

## 2022-07-31 DIAGNOSIS — I129 Hypertensive chronic kidney disease with stage 1 through stage 4 chronic kidney disease, or unspecified chronic kidney disease: Secondary | ICD-10-CM | POA: Diagnosis not present

## 2022-07-31 DIAGNOSIS — M329 Systemic lupus erythematosus, unspecified: Secondary | ICD-10-CM | POA: Diagnosis not present

## 2022-07-31 LAB — CBC WITH DIFFERENTIAL/PLATELET
Abs Immature Granulocytes: 0 10*3/uL (ref 0.00–0.07)
Basophils Absolute: 0.1 10*3/uL (ref 0.0–0.1)
Basophils Relative: 1 %
Eosinophils Absolute: 0.1 10*3/uL (ref 0.0–0.5)
Eosinophils Relative: 2 %
HCT: 38.2 % (ref 36.0–46.0)
Hemoglobin: 12.5 g/dL (ref 12.0–15.0)
Immature Granulocytes: 0 %
Lymphocytes Relative: 31 %
Lymphs Abs: 1.1 10*3/uL (ref 0.7–4.0)
MCH: 28.7 pg (ref 26.0–34.0)
MCHC: 32.7 g/dL (ref 30.0–36.0)
MCV: 87.6 fL (ref 80.0–100.0)
Monocytes Absolute: 0.4 10*3/uL (ref 0.1–1.0)
Monocytes Relative: 12 %
Neutro Abs: 2 10*3/uL (ref 1.7–7.7)
Neutrophils Relative %: 54 %
Platelets: 157 10*3/uL (ref 150–400)
RBC: 4.36 MIL/uL (ref 3.87–5.11)
RDW: 15.2 % (ref 11.5–15.5)
WBC: 3.7 10*3/uL — ABNORMAL LOW (ref 4.0–10.5)
nRBC: 0 % (ref 0.0–0.2)

## 2022-07-31 LAB — RENAL FUNCTION PANEL
Albumin: 3.7 g/dL (ref 3.5–5.0)
Anion gap: 7 (ref 5–15)
BUN: 20 mg/dL (ref 8–23)
CO2: 26 mmol/L (ref 22–32)
Calcium: 9 mg/dL (ref 8.9–10.3)
Chloride: 103 mmol/L (ref 98–111)
Creatinine, Ser: 1.2 mg/dL — ABNORMAL HIGH (ref 0.44–1.00)
GFR, Estimated: 49 mL/min — ABNORMAL LOW (ref >60)
Glucose, Bld: 92 mg/dL (ref 70–99)
Phosphorus: 4.3 mg/dL (ref 2.5–4.6)
Potassium: 4.4 mmol/L (ref 3.5–5.1)
Sodium: 136 mmol/L (ref 135–145)

## 2022-07-31 LAB — PROTEIN / CREATININE RATIO, URINE
Creatinine, Urine: 53 mg/dL
Total Protein, Urine: <6 mg/dL

## 2022-07-31 NOTE — Telephone Encounter (Signed)
Last Fill: 04/03/2022  Labs: CBC 07/31/2022 WBC 3.7, Creatinine 1.20, GFR 49, 03/29/2022 CMP Complements WNL. ESR WNL. UA-trace leukocytes.  Negative for nitrites and bacteria.  If she develops symptoms of a UTI she should follow up with her PCP. Creatinine elevated-1.24.  GFR low 47.  Avoid the use of NSAIDs. WBC count is low-3.2. rest of CBC WNL.    Next Visit: 08/01/2022  Last Visit: 04/03/2022  DX: Other systemic lupus erythematosus with other organ involvement   Current Dose per office note 04/03/2022: Imuran 50 mg 1 tablet daily   Okay to refill Imuran?

## 2022-08-01 ENCOUNTER — Encounter: Payer: Self-pay | Admitting: Physician Assistant

## 2022-08-01 ENCOUNTER — Ambulatory Visit: Payer: Medicare PPO | Attending: Physician Assistant | Admitting: Physician Assistant

## 2022-08-01 VITALS — BP 129/77 | HR 61 | Resp 13 | Ht 63.0 in | Wt 135.2 lb

## 2022-08-01 DIAGNOSIS — M5136 Other intervertebral disc degeneration, lumbar region: Secondary | ICD-10-CM | POA: Diagnosis not present

## 2022-08-01 DIAGNOSIS — Z79899 Other long term (current) drug therapy: Secondary | ICD-10-CM | POA: Diagnosis not present

## 2022-08-01 DIAGNOSIS — M25512 Pain in left shoulder: Secondary | ICD-10-CM | POA: Diagnosis not present

## 2022-08-01 DIAGNOSIS — M3214 Glomerular disease in systemic lupus erythematosus: Secondary | ICD-10-CM | POA: Diagnosis not present

## 2022-08-01 DIAGNOSIS — M25562 Pain in left knee: Secondary | ICD-10-CM | POA: Diagnosis not present

## 2022-08-01 DIAGNOSIS — Z8639 Personal history of other endocrine, nutritional and metabolic disease: Secondary | ICD-10-CM

## 2022-08-01 DIAGNOSIS — Z8719 Personal history of other diseases of the digestive system: Secondary | ICD-10-CM

## 2022-08-01 DIAGNOSIS — M51369 Other intervertebral disc degeneration, lumbar region without mention of lumbar back pain or lower extremity pain: Secondary | ICD-10-CM

## 2022-08-01 DIAGNOSIS — Z860101 Personal history of adenomatous and serrated colon polyps: Secondary | ICD-10-CM

## 2022-08-01 DIAGNOSIS — Z87448 Personal history of other diseases of urinary system: Secondary | ICD-10-CM

## 2022-08-01 DIAGNOSIS — M810 Age-related osteoporosis without current pathological fracture: Secondary | ICD-10-CM | POA: Diagnosis not present

## 2022-08-01 DIAGNOSIS — Z87898 Personal history of other specified conditions: Secondary | ICD-10-CM

## 2022-08-01 DIAGNOSIS — M3219 Other organ or system involvement in systemic lupus erythematosus: Secondary | ICD-10-CM | POA: Diagnosis not present

## 2022-08-01 DIAGNOSIS — Z862 Personal history of diseases of the blood and blood-forming organs and certain disorders involving the immune mechanism: Secondary | ICD-10-CM

## 2022-08-01 DIAGNOSIS — G8929 Other chronic pain: Secondary | ICD-10-CM

## 2022-08-01 DIAGNOSIS — Z86010 Personal history of colonic polyps: Secondary | ICD-10-CM

## 2022-08-03 LAB — ANTINUCLEAR ANTIBODIES, IFA: ANA Ab, IFA: NEGATIVE

## 2022-10-02 ENCOUNTER — Other Ambulatory Visit: Payer: Self-pay | Admitting: Physician Assistant

## 2022-10-02 ENCOUNTER — Other Ambulatory Visit: Payer: Self-pay | Admitting: Family Medicine

## 2022-10-02 DIAGNOSIS — Z79899 Other long term (current) drug therapy: Secondary | ICD-10-CM

## 2022-10-02 DIAGNOSIS — M3219 Other organ or system involvement in systemic lupus erythematosus: Secondary | ICD-10-CM

## 2022-10-02 NOTE — Telephone Encounter (Signed)
Last Fill: 06/29/2022  Eye exam: 05/26/2022  Labs: 07/31/2022 CBC: WBC 3.7 03/29/2022 Complements WNL. ESR WNL. UA-trace leukocytes.  Negative for nitrites and bacteria.  If she develops symptoms of a UTI she should follow up with her PCP. Creatinine elevated-1.24.  GFR low 47.  Avoid the use of NSAIDs. WBC count is low-3.2. rest of CBC WNL.    Next Visit: 11/01/2022  Last Visit: 08/01/2022  DX:Other systemic lupus erythematosus with other organ involvement   Current Dose per office note on 08/01/2022: plaquenil 200 mg 1 tablet by mouth twice daily M-F.    Spoke with patient and she will update labs. Please review and sign pended lab orders for future labs.   Okay to refill Plaquenil?

## 2022-10-12 ENCOUNTER — Encounter: Payer: Medicare PPO | Admitting: Family Medicine

## 2022-10-18 NOTE — Progress Notes (Unsigned)
Office Visit Note  Patient: [name]             Date of Birth: 06-07-1950           MRN: [id number]             PCP: Kerri Perches, MD Referring: Kerri Perches, MD Visit Date: 11/01/2022 Occupation: @GUAROCC @  Subjective:  Medication monitoring   History of Present Illness: [name] is a 72 y.o. female with history of systemic lupus, lupus nephritis, and osteoporosis.  Patient remains on Imuran 50 mg 1 tablet daily and plaquenil 200 mg 1 tablet by mouth twice daily M-F.  She is tolerating combination therapy without any side effects and has not had any interruptions in therapy.  Patient states her energy level has been stable.  She denies any increased joint pain or joint swelling.  She has not had any recent rashes and has been trying to avoid direct sun exposure.  She denies any symptoms of Raynaud's phenomenon.  She has not had any oral or nasal ulcerations.  She denies any swollen lymph nodes.  Patient states she continues to have chronic sicca symptoms and has been using Systane eyedrops and as well as lotions for symptomatic relief. She remains on Fosamax 70 mg 1 tablet by mouth once weekly along with calcium and vitamin D.  She denies any recent falls or fractures.  She is overdue to update her bone density.  Patient is scheduled to see her PCP Dr. Lodema Hong tomorrow at which time she plans on having updated DEXA. Patient denies any new medical conditions.  She denies any recent or recurrent infections. She is scheduled to follow-up with her nephrologist in September 2024.  Activities of Daily Living:  Patient reports morning stiffness for 0 minutes.   Patient Denies nocturnal pain.  Difficulty dressing/grooming: Denies Difficulty climbing stairs: Denies Difficulty getting out of chair: Denies Difficulty using hands for taps, buttons, cutlery, and/or writing: Denies  Review of Systems  Constitutional:  Negative for fatigue.  HENT:  Positive for mouth  dryness. Negative for mouth sores.   Eyes:  Positive for dryness.  Respiratory:  Negative for shortness of breath.   Cardiovascular:  Negative for chest pain and palpitations.  Gastrointestinal:  Negative for blood in stool, constipation and diarrhea.  Endocrine: Negative for increased urination.  Genitourinary:  Negative for involuntary urination.  Musculoskeletal:  Negative for joint pain, gait problem, joint pain, joint swelling, myalgias, muscle weakness, morning stiffness, muscle tenderness and myalgias.  Skin:  Positive for hair loss. Negative for color change, rash and sensitivity to sunlight.  Allergic/Immunologic: Negative for susceptible to infections.  Neurological:  Negative for dizziness and headaches.  Hematological:  Negative for swollen glands.  Psychiatric/Behavioral:  Negative for depressed mood and sleep disturbance. The patient is not nervous/anxious.     PMFS History:  Patient Active Problem List   Diagnosis Date Noted   Encounter for annual physical exam 10/11/2021   Cervical spondylosis 06/12/2021   Chronic low back pain with left-sided sciatica 04/11/2021   Baker's cyst of knee, left 11/26/2020   Seborrheic dermatitis of scalp 07/19/2020   Essential hypertension 07/19/2020   Overweight (BMI 25.0-29.9) 07/19/2020   Systemic lupus erythematosus arthritis (HCC) 02/12/2020   Allergic sinusitis 11/04/2018   Hyperlipidemia 03/08/2017   Proteinuria 03/08/2017   SLE glomerulonephritis syndrome (HCC) 09/15/2016   High risk medication use 09/15/2016   Degeneration of lumbar intervertebral disc 09/15/2016   Personal history of other endocrine,  nutritional and metabolic disease 65/78/4696   History of anemia 09/15/2016   History of proteinuria  09/15/2016   Tubular adenoma of colon 04/30/2016   Prediabetes 11/15/2014   Cervicalgia 05/11/2014   Osteopenia 07/21/2013   Anemia 12/22/2012   Systemic lupus erythematosus (HCC) 02/15/2012   H/O: gastrointestinal disease  10/01/2007    Past Medical History:  Diagnosis Date   Arthritis 02/13/2010   abnormal MRI lumbar and c spine  with disc disease   History of anemia    History of diverticulosis    History of prediabetes    Osteoporosis    SLE (systemic lupus erythematosus) (HCC) dx apprx 1990   reports immunotherapy, cytoxan and steroids at [facility], no f/u for over 21yrs    Family History  Problem Relation Age of Onset   Heart disease Father    Cancer Brother 98       breast   Breast cancer Brother    Cancer Sister 30       breast   Breast cancer Sister    Colon cancer Neg Hx    Past Surgical History:  Procedure Laterality Date   ABDOMINAL HYSTERECTOMY  unsure, over 10 yrs   partial, has had abnormal pap   BLADDER REPAIR     COLONOSCOPY N/A 01/05/2014   Procedure: COLONOSCOPY;  Surgeon: Corbin Ade, MD;  Location: [facility];  Service: Endoscopy;  Laterality: N/A;  12:15 PM   COLONOSCOPY N/A 07/07/2020   Procedure: COLONOSCOPY;  Surgeon: Corbin Ade, MD;  Location: [facility];  Service: Endoscopy;  Laterality: N/A;  ASA II / AM procedure   Social History   Social History Narrative   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   1 grandchild   Immunization History  Administered Date(s) Administered   Fluad Quad(high Dose 65+) 11/18/2018, 01/05/2020, 11/26/2020   Influenza Split 02/14/2012   Influenza Whole 12/28/2000   Influenza,inj,Quad PF,6+ Mos 12/16/2012, 01/06/2014, 01/19/2016, 12/10/2017   Influenza-Unspecified 12/27/2021   Moderna SARS-COV2 Booster Vaccination 04/20/2021   Moderna Sars-Covid-2 Vaccination 05/02/2019, 05/31/2019, 01/29/2020, 10/04/2020   Pneumococcal Conjugate-13 09/25/2017   Pneumococcal Polysaccharide-23 09/30/2018   Td 09/17/2003   Tdap 01/06/2014   Zoster, Live 12/17/2012     Objective: Vital Signs: BP 120/74 (BP Location: Left Arm, Patient Position: Sitting, Cuff Size: Normal)   Pulse 60   Resp 15   Ht 5\' 3"  (1.6 m)   Wt 135 lb 3.2 oz (61.3  kg)   BMI 23.95 kg/m    Physical Exam Vitals and nursing note reviewed.  Constitutional:      Appearance: She is well-developed.  HENT:     Head: Normocephalic and atraumatic.  Eyes:     Conjunctiva/sclera: Conjunctivae normal.  Cardiovascular:     Rate and Rhythm: Normal rate and regular rhythm.     Heart sounds: Murmur heard.  Pulmonary:     Effort: Pulmonary effort is normal.     Breath sounds: Normal breath sounds.  Abdominal:     General: Bowel sounds are normal.     Palpations: Abdomen is soft.  Musculoskeletal:     Cervical back: Normal range of motion.  Lymphadenopathy:     Cervical: No cervical adenopathy.  Skin:    General: Skin is warm and dry.     Capillary Refill: Capillary refill takes less than 2 seconds.  Neurological:     Mental Status: She is alert and oriented to person, place, and time.  Psychiatric:        Behavior:  Behavior normal.      Musculoskeletal Exam: C-spine, thoracic spine, lumbar spine good range of motion.  Shoulder joints, elbow joints, wrist joints, MCPs, PIPs, DIPs have good range of motion with no synovitis.  Complete fist formation bilaterally.  Hip joints have good range of motion with no groin pain.  Knee joints have good range of motion with no warmth or effusion.  Ankle joints have good range of motion with no tenderness or joint swelling.  CDAI Exam: CDAI Score: -- Patient Global: --; Provider Global: -- Swollen: --; Tender: -- Joint Exam 11/01/2022   No joint exam has been documented for this visit   There is currently no information documented on the homunculus. Go to the Rheumatology activity and complete the homunculus joint exam.  Investigation: No additional findings.  Imaging: No results found.  Recent Labs: Lab Results  Component Value Date   WBC 3.7 (L) 07/31/2022   HGB 12.5 07/31/2022   PLT 157 07/31/2022   NA 136 07/31/2022   K 4.4 07/31/2022   CL 103 07/31/2022   CO2 26 07/31/2022   GLUCOSE 92  07/31/2022   BUN 20 07/31/2022   CREATININE 1.20 (H) 07/31/2022   BILITOT 0.5 03/29/2022   ALKPHOS 43 (L) 04/07/2021   AST 30 03/29/2022   ALT 20 03/29/2022   PROT 7.9 03/29/2022   ALBUMIN 3.7 07/31/2022   CALCIUM 9.0 07/31/2022   GFRAA 47 (L) 11/28/2019   QFTBGOLDPLUS NEGATIVE 08/03/2021    Speciality Comments: Plaquenil eye exam normal on 05/26/2022 [facility] f/u 6 months  Procedures:  No procedures performed Allergies: Sulfa antibiotics and Sulfonamide derivatives   Assessment / Plan:     Visit Diagnoses: Other systemic lupus erythematosus with other organ involvement (HCC) - History of dsDNA +64, Smith + 5.5, RNP+ 6.5, SSA(Ro) Negative, SSB (La) Negative, presented initially with nephritis, hair loss, arthralgias, dermatitis: She has not had any signs or symptoms of a systemic lupus flare.  She has clinically been doing well taking Imuran 50 mg 1 tablet by mouth daily and Plaquenil 200 mg 1 tablet by mouth twice daily Monday through Friday.  She is tolerating combination therapy without any interruptions.  She has no synovitis on examination today.  No recurrence of left knee joint pain or effusion.  Her energy level has been stable.  She continues to have chronic sicca symptoms and has been using Systane eyedrops and lotions for symptomatic relief.  She has not had any oral or nasal ulcerations.  No cervical lymphadenopathy was noted.  She has not had any recent rashes and has been avoiding direct sun exposure.  She has not had any symptoms of Raynaud's phenomenon.  Capillary refill less than 2 seconds today.  On examination her lungs were clear to auscultation.  A new systolic murmur was auscultated today.  Discussed a possible referral to cardiology but she will be seeing her PCP tomorrow which time she plans to further discuss.  She has not noticed any new or worsening cardiopulmonary symptoms. The following lab work will be obtained today for further evaluation.  She will remain on  Plaquenil and Imuran as prescribed.  She was advised to notify us if she develops signs or symptoms of a flare.  She will follow-up in the office in 3 months or sooner if needed.   - Plan: Protein / creatinine ratio, urine, CBC with Differential/Platelet, COMPLETE METABOLIC PANEL WITH GFR, Anti-DNA antibody, double-stranded, C3 and C4, Sedimentation rate, azaTHIOprine (IMURAN) 50 MG tablet  Lupus nephritis (HCC) -Treated with Cytoxan IV and prednisone for 1 year.  Followed closely by Dr. Wolfgang Phoenix every 3 months-upcoming appointment scheduled in September 2024.  Protein creatinine ratio and CMP updated today-results will be forwarded to Dr. Wolfgang Phoenix.  High risk medication use - Imuran 50 mg 1 tablet daily and plaquenil 200 mg 1 tablet by mouth twice daily M-F. Plaquenil eye exam normal on 05/26/2022 [facility] f/u 6 months  CBC and CMP updated on 07/31/22: Creatinine was 1.20 and GFR was 49, white blood cell count was 3.7, hemoglobin 12.5, and platelets 157K, and no proteinuria.  CBC and CMP updated today. No recent or recurrent infections. - Plan: CBC with Differential/Platelet, COMPLETE METABOLIC PANEL WITH GFR  Chronic left shoulder pain: Resolved.  Good range of motion with no discomfort.  Chronic pain of left knee: She has good range of motion of the left knee joint on examination today.  No warmth or effusion noted.  DDD (degenerative disc disease), lumbar: She is not having any increased discomfort in her lower back at this time.  Age-related osteoporosis without current pathological fracture - DEXA 01/19/2020: The BMD measured at Forearm Radius 33% is 0.549 g/cm2 with a T-score of -2.3.   Reviewed DEXA results from 2015 and 2019 with the patient today in detail.  Patient has been taking Fosamax 70 mg 1 tablet by mouth once weekly.  Discussed that she would likely benefit from a drug holiday due to possible adverse effects as well as reduced efficacy after 3 to 5 years of consecutive use of  Fosamax. She is taking a calcium and vitamin D supplement daily.  No recent falls or fractures.  Murmur: New systolic murmur auscultated today.  Patient has a follow-up visit scheduled with her PCP tomorrow at which time she plans to further discuss.   Other medical conditions are listed as follows:   History of hyperlipidemia  History of proteinuria syndrome  History of prediabetes  History of diverticulosis  History of anemia  History of renal insufficiency  History of adenomatous polyp of colon  Orders: Orders Placed This Encounter  Procedures   Protein / creatinine ratio, urine   CBC with Differential/Platelet   COMPLETE METABOLIC PANEL WITH GFR   Anti-DNA antibody, double-stranded   C3 and C4   Sedimentation rate   Meds ordered this encounter  Medications   azaTHIOprine (IMURAN) 50 MG tablet    Sig: TAKE ONE TABLET (50MG  TOTAL) BY MOUTH DAILY    Dispense:  90 tablet    Refill:  0    Follow-Up Instructions: Return in about 3 months (around 02/01/2023) for Systemic lupus erythematosus, Osteoporosis.   Gearldine Bienenstock, PA-C  Note - This record has been created using Dragon software.  Chart creation errors have been sought, but may not always  have been located. Such creation errors do not reflect on  the standard of medical care.

## 2022-10-28 LAB — AMB RESULTS CONSOLE CBG: Glucose: 112

## 2022-10-28 NOTE — Progress Notes (Signed)
No SDOH needs at this time

## 2022-11-01 ENCOUNTER — Ambulatory Visit: Payer: Medicare PPO | Attending: Physician Assistant | Admitting: Physician Assistant

## 2022-11-01 ENCOUNTER — Encounter: Payer: Self-pay | Admitting: Physician Assistant

## 2022-11-01 VITALS — BP 120/74 | HR 60 | Resp 15 | Ht 63.0 in | Wt 135.2 lb

## 2022-11-01 DIAGNOSIS — G8929 Other chronic pain: Secondary | ICD-10-CM

## 2022-11-01 DIAGNOSIS — M25512 Pain in left shoulder: Secondary | ICD-10-CM

## 2022-11-01 DIAGNOSIS — Z862 Personal history of diseases of the blood and blood-forming organs and certain disorders involving the immune mechanism: Secondary | ICD-10-CM

## 2022-11-01 DIAGNOSIS — Z87448 Personal history of other diseases of urinary system: Secondary | ICD-10-CM

## 2022-11-01 DIAGNOSIS — M810 Age-related osteoporosis without current pathological fracture: Secondary | ICD-10-CM

## 2022-11-01 DIAGNOSIS — Z79899 Other long term (current) drug therapy: Secondary | ICD-10-CM | POA: Diagnosis not present

## 2022-11-01 DIAGNOSIS — Z8719 Personal history of other diseases of the digestive system: Secondary | ICD-10-CM

## 2022-11-01 DIAGNOSIS — Z8639 Personal history of other endocrine, nutritional and metabolic disease: Secondary | ICD-10-CM | POA: Diagnosis not present

## 2022-11-01 DIAGNOSIS — Z860101 Personal history of adenomatous and serrated colon polyps: Secondary | ICD-10-CM

## 2022-11-01 DIAGNOSIS — M51369 Other intervertebral disc degeneration, lumbar region without mention of lumbar back pain or lower extremity pain: Secondary | ICD-10-CM

## 2022-11-01 DIAGNOSIS — Z87898 Personal history of other specified conditions: Secondary | ICD-10-CM

## 2022-11-01 DIAGNOSIS — Z86010 Personal history of colonic polyps: Secondary | ICD-10-CM

## 2022-11-01 DIAGNOSIS — M25562 Pain in left knee: Secondary | ICD-10-CM | POA: Diagnosis not present

## 2022-11-01 DIAGNOSIS — M3219 Other organ or system involvement in systemic lupus erythematosus: Secondary | ICD-10-CM

## 2022-11-01 DIAGNOSIS — M5136 Other intervertebral disc degeneration, lumbar region: Secondary | ICD-10-CM | POA: Diagnosis not present

## 2022-11-01 DIAGNOSIS — M3214 Glomerular disease in systemic lupus erythematosus: Secondary | ICD-10-CM

## 2022-11-01 DIAGNOSIS — R011 Cardiac murmur, unspecified: Secondary | ICD-10-CM | POA: Diagnosis not present

## 2022-11-01 LAB — CBC WITH DIFFERENTIAL/PLATELET
Absolute Monocytes: 507 cells/uL (ref 200–950)
Basophils Absolute: 30 cells/uL (ref 0–200)
Basophils Relative: 1 %
Eosinophils Absolute: 81 cells/uL (ref 15–500)
Eosinophils Relative: 2.7 %
HCT: 36.9 % (ref 35.0–45.0)
Hemoglobin: 12.1 g/dL (ref 11.7–15.5)
Lymphs Abs: 783 cells/uL — ABNORMAL LOW (ref 850–3900)
MCH: 28.1 pg (ref 27.0–33.0)
MCHC: 32.8 g/dL (ref 32.0–36.0)
MCV: 85.8 fL (ref 80.0–100.0)
MPV: 12.9 fL — ABNORMAL HIGH (ref 7.5–12.5)
Monocytes Relative: 16.9 %
Neutro Abs: 1599 cells/uL (ref 1500–7800)
Neutrophils Relative %: 53.3 %
Platelets: 155 10*3/uL (ref 140–400)
RBC: 4.3 10*6/uL (ref 3.80–5.10)
RDW: 13.3 % (ref 11.0–15.0)
Total Lymphocyte: 26.1 %
WBC: 3 10*3/uL — ABNORMAL LOW (ref 3.8–10.8)

## 2022-11-01 LAB — SEDIMENTATION RATE: Sed Rate: 17 mm/h (ref 0–30)

## 2022-11-01 MED ORDER — AZATHIOPRINE 50 MG PO TABS
ORAL_TABLET | ORAL | 0 refills | Status: DC
Start: 2022-11-01 — End: 2023-04-04

## 2022-11-02 ENCOUNTER — Encounter: Payer: Medicare PPO | Admitting: Family Medicine

## 2022-11-02 NOTE — Progress Notes (Signed)
No proteinuria.  ESR WNL  WBC count is low-3.0.  absolute lymphocytes are low. We will continue to monitor closely.  Creatinine remains elevated-1.13 and GFR Low-52. Avoid the use of NSAIDs. Forward results to nephrologist as requested.

## 2022-11-02 NOTE — Progress Notes (Signed)
Complements WNL

## 2022-11-03 ENCOUNTER — Telehealth: Payer: Self-pay | Admitting: Rheumatology

## 2022-11-03 NOTE — Telephone Encounter (Signed)
Pt left a message asking to go over lab work. Call back number is [phone]

## 2022-11-03 NOTE — Telephone Encounter (Signed)
Patient advised Complements WNL

## 2022-11-08 ENCOUNTER — Encounter: Payer: Self-pay | Admitting: *Deleted

## 2022-11-08 NOTE — Progress Notes (Signed)
Pt attended 10/28/22 screening event where her b/p was 110/76 and her blood sugar was 112. At the event,pt confirmed her PCP was Dr. Syliva Overman at [facility], and she did not identify any SDOH insecurities. Per chart review, pt last seen by PCP on 04/13/22 and has a future appt with Dr. Lodema Hong on 11/14/22. Documentation also demonstrates pt is receiving ongoing rheumatology care by [facility] providers. No additional health equity team support indicated at this time.

## 2022-11-14 ENCOUNTER — Ambulatory Visit (INDEPENDENT_AMBULATORY_CARE_PROVIDER_SITE_OTHER): Payer: Medicare PPO | Admitting: Family Medicine

## 2022-11-14 ENCOUNTER — Encounter: Payer: Self-pay | Admitting: Family Medicine

## 2022-11-14 VITALS — BP 140/82 | HR 71 | Ht 63.0 in | Wt 136.1 lb

## 2022-11-14 DIAGNOSIS — E559 Vitamin D deficiency, unspecified: Secondary | ICD-10-CM | POA: Diagnosis not present

## 2022-11-14 DIAGNOSIS — R011 Cardiac murmur, unspecified: Secondary | ICD-10-CM | POA: Diagnosis not present

## 2022-11-14 DIAGNOSIS — E782 Mixed hyperlipidemia: Secondary | ICD-10-CM | POA: Diagnosis not present

## 2022-11-14 DIAGNOSIS — Z0001 Encounter for general adult medical examination with abnormal findings: Secondary | ICD-10-CM | POA: Diagnosis not present

## 2022-11-14 DIAGNOSIS — R7303 Prediabetes: Secondary | ICD-10-CM | POA: Diagnosis not present

## 2022-11-14 DIAGNOSIS — Z1231 Encounter for screening mammogram for malignant neoplasm of breast: Secondary | ICD-10-CM | POA: Diagnosis not present

## 2022-11-14 DIAGNOSIS — I10 Essential (primary) hypertension: Secondary | ICD-10-CM | POA: Diagnosis not present

## 2022-11-14 NOTE — Assessment & Plan Note (Signed)
Hyperlipidemia:Low fat diet discussed and encouraged.   Lipid Panel  Lab Results  Component Value Date   CHOL 189 04/21/2022   HDL 42 04/21/2022   LDLCALC 127 (H) 04/21/2022   TRIG 111 04/21/2022   CHOLHDL 4.5 (H) 04/21/2022     Updated lab needed at/ before next visit.

## 2022-11-14 NOTE — Assessment & Plan Note (Signed)
Elevated at visit, no med changeat this time Crdioogy eval for possible murmur DASH diet and commitment to daily physical activity for a minimum of 30 minutes discussed and encouraged, as a part of hypertension management. The importance of attaining a healthy weight is also discussed.     11/14/2022    4:19 PM 11/14/2022    4:18 PM 11/14/2022    2:54 PM 11/01/2022    9:23 AM 10/28/2022   10:09 AM 08/01/2022    8:45 AM 04/13/2022    3:42 PM  BP/Weight  Systolic BP 140 142 135 120 110 129 125  Diastolic BP 82 82 79 74 76 77 80  Wt. (Lbs)   136.08 135.2  135.2 134.04  BMI   24.11 kg/m2 23.95 kg/m2  23.95 kg/m2 23.74 kg/m2

## 2022-11-14 NOTE — Patient Instructions (Addendum)
F/U in early to mid December   Please schedule mammogram at checkout  Fasting lipid, HBA1C, TSH and vit D, next week Monday/Tuesday  You are referred for evaluation of heart murmur  Blood pressure above goal of 130/80 or less  Covid vaccine at pharmacy, flu vaccine available in office September  .It is important that you exercise regularly at least 30 minutes 5 times a week. If you develop chest pain, have severe difficulty breathing, or feel very tired, stop exercising immediately and seek medical attention  Think about what you will eat, plan ahead. Choose " clean, green, fresh or frozen" over canned, processed or packaged foods which are more sugary, salty and fatty. 70 to 75% of food eaten should be vegetables and fruit. Three meals at set times with snacks allowed between meals, but they must be fruit or vegetables. Aim to eat over a 12 hour period , example 7 am to 7 pm, and STOP after  your last meal of the day. Drink water,generally about 64 ounces per day, no other drink is as healthy. Fruit juice is best enjoyed in a healthy way, by EATING the fruit. Thanks for choosing [facility], we consider it a privelige to serve you.

## 2022-11-14 NOTE — Assessment & Plan Note (Signed)
Refer cardiology for evaluation

## 2022-11-14 NOTE — Progress Notes (Signed)
[name]     MRN: [id number]      DOB: [birth year]  Chief Complaint  Patient presents with   Annual Exam    CPE concerned was told by rheumatology that she has a heart murmur     HPI: Patient is in for annual physical exam. Immunization is reviewed , and  updated if needed.   PE: BP (!) 140/82   Pulse 71   Ht 5\' 3"  (1.6 m)   Wt 136 lb 1.3 oz (61.7 kg)   SpO2 98%   BMI 24.11 kg/m   Pleasant  female, alert and oriented x 3, in no cardio-pulmonary distress. Afebrile. HEENT No facial trauma or asymetry. Sinuses non tender.  Extra occullar muscles intact.. External ears normal, . Neck: supple,   Chest: Clear to ascultation bilaterally.No crackles or wheezes. Non tender to palpation  Breast: Normal mammogram 10/2022, not examined  Cardiovascular system; Heart sounds normal,  S1 and  S2 ,no S3. Murmur  Peripheral pulses normal.  Abdomen: Soft, non tender, .   Musculoskeletal exam: Full ROM of spine, hips , shoulders and knees. No deformity ,swelling or crepitus noted. No muscle wasting or atrophy.   Neurologic: Cranial nerves 2 to 12 intact. Power, tone ,sensation  normal throughout. No disturbance in gait. No tremor.  Skin: Intact, no ulceration, erythema , scaling or rash noted. Pigmentation normal throughout  Psych; Normal mood and affect. Judgement and concentration normal   Assessment & Plan:  Encounter for Medicare annual examination with abnormal findings Annual exam as documented.  Immunization and cancer screening needs are specifically addressed at this visit.   Essential hypertension Elevated at visit, no med changeat this time Crdioogy eval for possible murmur DASH diet and commitment to daily physical activity for a minimum of 30 minutes discussed and encouraged, as a part of hypertension management. The importance of attaining a healthy weight is also discussed.     11/14/2022    4:19 PM 11/14/2022    4:18 PM 11/14/2022     2:54 PM 11/01/2022    9:23 AM 10/28/2022   10:09 AM 08/01/2022    8:45 AM 04/13/2022    3:42 PM  BP/Weight  Systolic BP 140 142 135 120 110 129 125  Diastolic BP 82 82 79 74 76 77 80  Wt. (Lbs)   136.08 135.2  135.2 134.04  BMI   24.11 kg/m2 23.95 kg/m2  23.95 kg/m2 23.74 kg/m2       Hyperlipidemia Hyperlipidemia:Low fat diet discussed and encouraged.   Lipid Panel  Lab Results  Component Value Date   CHOL 189 04/21/2022   HDL 42 04/21/2022   LDLCALC 127 (H) 04/21/2022   TRIG 111 04/21/2022   CHOLHDL 4.5 (H) 04/21/2022     Updated lab needed at/ before next visit.   Prediabetes Patient educated about the importance of limiting  Carbohydrate intake , the need to commit to daily physical activity for a minimum of 30 minutes , and to commit weight loss. The fact that changes in all these areas will reduce or eliminate all together the development of diabetes is stressed.      Latest Ref Rng & Units 11/01/2022   10:12 AM 07/31/2022    8:51 AM 04/21/2022    8:07 AM 03/29/2022    7:05 AM 10/11/2021    9:36 AM  Diabetic Labs  HbA1c 4.8 - 5.6 %   6.2   6.1   Chol 100 - 199 mg/dL   409  192   HDL >39 mg/dL   42   40   Calc LDL 0 - 99 mg/dL   756   433   Triglycerides 0 - 149 mg/dL   295   188   Creatinine 0.60 - 1.00 mg/dL 4.16  6.06   3.01        11/14/2022    4:19 PM 11/14/2022    4:18 PM 11/14/2022    2:54 PM 11/01/2022    9:23 AM 10/28/2022   10:09 AM 08/01/2022    8:45 AM 04/13/2022    3:42 PM  BP/Weight  Systolic BP 140 142 135 120 110 129 125  Diastolic BP 82 82 79 74 76 77 80  Wt. (Lbs)   136.08 135.2  135.2 134.04  BMI   24.11 kg/m2 23.95 kg/m2  23.95 kg/m2 23.74 kg/m2       No data to display          Updated lab needed at/ before next visit.   Murmur, cardiac Refer cardiology for evaluation

## 2022-11-14 NOTE — Assessment & Plan Note (Signed)
Patient educated about the importance of limiting  Carbohydrate intake , the need to commit to daily physical activity for a minimum of 30 minutes , and to commit weight loss. The fact that changes in all these areas will reduce or eliminate all together the development of diabetes is stressed.      Latest Ref Rng & Units 11/01/2022   10:12 AM 07/31/2022    8:51 AM 04/21/2022    8:07 AM 03/29/2022    7:05 AM 10/11/2021    9:36 AM  Diabetic Labs  HbA1c 4.8 - 5.6 %   6.2   6.1   Chol 100 - 199 mg/dL   829   562   HDL >13 mg/dL   42   40   Calc LDL 0 - 99 mg/dL   086   578   Triglycerides 0 - 149 mg/dL   469   629   Creatinine 0.60 - 1.00 mg/dL 5.28  4.13   2.44        11/14/2022    4:19 PM 11/14/2022    4:18 PM 11/14/2022    2:54 PM 11/01/2022    9:23 AM 10/28/2022   10:09 AM 08/01/2022    8:45 AM 04/13/2022    3:42 PM  BP/Weight  Systolic BP 140 142 135 120 110 129 125  Diastolic BP 82 82 79 74 76 77 80  Wt. (Lbs)   136.08 135.2  135.2 134.04  BMI   24.11 kg/m2 23.95 kg/m2  23.95 kg/m2 23.74 kg/m2       No data to display          Updated lab needed at/ before next visit.

## 2022-11-14 NOTE — Assessment & Plan Note (Signed)
Annual exam as documented. . Immunization and cancer screening needs are specifically addressed at this visit.  

## 2022-11-16 ENCOUNTER — Other Ambulatory Visit: Payer: Self-pay | Admitting: Family Medicine

## 2022-11-23 ENCOUNTER — Other Ambulatory Visit (HOSPITAL_COMMUNITY)
Admission: RE | Admit: 2022-11-23 | Discharge: 2022-11-23 | Disposition: A | Discharge status: Home / Self Care | Payer: Medicare PPO | Source: Ambulatory Visit | Attending: Family Medicine | Admitting: Family Medicine

## 2022-11-23 DIAGNOSIS — E559 Vitamin D deficiency, unspecified: Secondary | ICD-10-CM | POA: Insufficient documentation

## 2022-11-23 DIAGNOSIS — E782 Mixed hyperlipidemia: Secondary | ICD-10-CM | POA: Insufficient documentation

## 2022-11-23 DIAGNOSIS — R7303 Prediabetes: Secondary | ICD-10-CM | POA: Diagnosis not present

## 2022-11-23 DIAGNOSIS — I10 Essential (primary) hypertension: Secondary | ICD-10-CM | POA: Insufficient documentation

## 2022-11-23 LAB — HEMOGLOBIN A1C
Hgb A1c MFr Bld: 6.4 % — ABNORMAL HIGH (ref 4.8–5.6)
Mean Plasma Glucose: 136.98 mg/dL

## 2022-11-23 LAB — TSH: TSH: 2.324 u[IU]/mL (ref 0.350–4.500)

## 2022-11-23 LAB — LIPID PANEL
Cholesterol: 189 mg/dL (ref 0–200)
HDL: 41 mg/dL (ref >40)
LDL Cholesterol: 128 mg/dL — ABNORMAL HIGH (ref 0–99)
Total CHOL/HDL Ratio: 4.6 RATIO
Triglycerides: 102 mg/dL (ref <150)
VLDL: 20 mg/dL (ref 0–40)

## 2022-11-23 LAB — VITAMIN D 25 HYDROXY (VIT D DEFICIENCY, FRACTURES): Vit D, 25-Hydroxy: 44.9 ng/mL (ref 30–100)

## 2022-12-01 DIAGNOSIS — D72819 Decreased white blood cell count, unspecified: Secondary | ICD-10-CM | POA: Diagnosis not present

## 2022-12-01 DIAGNOSIS — N1831 Chronic kidney disease, stage 3a: Secondary | ICD-10-CM | POA: Diagnosis not present

## 2022-12-01 DIAGNOSIS — M329 Systemic lupus erythematosus, unspecified: Secondary | ICD-10-CM | POA: Diagnosis not present

## 2022-12-01 DIAGNOSIS — I129 Hypertensive chronic kidney disease with stage 1 through stage 4 chronic kidney disease, or unspecified chronic kidney disease: Secondary | ICD-10-CM | POA: Diagnosis not present

## 2023-01-08 ENCOUNTER — Other Ambulatory Visit: Payer: Self-pay | Admitting: Family Medicine

## 2023-01-12 ENCOUNTER — Ambulatory Visit: Payer: Medicare PPO | Admitting: Internal Medicine

## 2023-01-12 ENCOUNTER — Other Ambulatory Visit: Payer: Self-pay | Admitting: Rheumatology

## 2023-01-12 DIAGNOSIS — M3219 Other organ or system involvement in systemic lupus erythematosus: Secondary | ICD-10-CM

## 2023-01-12 NOTE — Telephone Encounter (Signed)
Last Fill: 10/02/2022  Eye exam: normal on 05/26/2022   Labs: 11/01/2022 WBC count is low-3.0.  absolute lymphocytes are low. We will continue to monitor closely. Creatinine remains elevated-1.13 and GFR Low-52.   Next Visit: 02/01/2023  Last Visit: 11/01/2022  DX:Other systemic lupus erythematosus with other organ involvement   Current Dose per office note 11/01/2022: plaquenil 200 mg 1 tablet by mouth twice daily M-F.   Okay to refill Plaquenil?

## 2023-02-01 ENCOUNTER — Ambulatory Visit: Payer: Medicare PPO | Admitting: Physician Assistant

## 2023-02-01 DIAGNOSIS — Z860101 Personal history of adenomatous and serrated colon polyps: Secondary | ICD-10-CM

## 2023-02-01 DIAGNOSIS — Z8639 Personal history of other endocrine, nutritional and metabolic disease: Secondary | ICD-10-CM

## 2023-02-01 DIAGNOSIS — Z8719 Personal history of other diseases of the digestive system: Secondary | ICD-10-CM

## 2023-02-01 DIAGNOSIS — Z862 Personal history of diseases of the blood and blood-forming organs and certain disorders involving the immune mechanism: Secondary | ICD-10-CM

## 2023-02-01 DIAGNOSIS — Z79899 Other long term (current) drug therapy: Secondary | ICD-10-CM

## 2023-02-01 DIAGNOSIS — R011 Cardiac murmur, unspecified: Secondary | ICD-10-CM

## 2023-02-01 DIAGNOSIS — G8929 Other chronic pain: Secondary | ICD-10-CM

## 2023-02-01 DIAGNOSIS — M810 Age-related osteoporosis without current pathological fracture: Secondary | ICD-10-CM

## 2023-02-01 DIAGNOSIS — M3219 Other organ or system involvement in systemic lupus erythematosus: Secondary | ICD-10-CM

## 2023-02-01 DIAGNOSIS — M3214 Glomerular disease in systemic lupus erythematosus: Secondary | ICD-10-CM

## 2023-02-01 DIAGNOSIS — M51369 Other intervertebral disc degeneration, lumbar region without mention of lumbar back pain or lower extremity pain: Secondary | ICD-10-CM

## 2023-02-01 DIAGNOSIS — Z87898 Personal history of other specified conditions: Secondary | ICD-10-CM

## 2023-02-01 DIAGNOSIS — Z87448 Personal history of other diseases of urinary system: Secondary | ICD-10-CM

## 2023-02-13 NOTE — Progress Notes (Signed)
Office Visit Note  Patient: [name]             Date of Birth: 10-Mar-1951           MRN: [id number]             PCP: Kerri Perches, MD Referring: Kerri Perches, MD Visit Date: 02/27/2023 Occupation: @GUAROCC @  Subjective:  Medication monitoring   History of Present Illness: [name] is a 72 y.o. female with history of systemic lupus, lupus nephritis, and osteoporosis.  Patient remains on Imuran 50 mg 1 tablet daily and plaquenil 200 mg 1 tablet by mouth twice daily M-F.  She is tolerating combination therapy without any side effects.  She denies any signs or symptoms of a systemic lupus flare.  She has not had any oral or nasal ulcerations.  She uses oral rinse and lotions for symptoms of dry mouth.  She has not had any symptoms of Raynaud's phenomenon.  She denies any swollen lymph nodes.  She denies any recent rashes.  She continues to have a good energy level.  She has been sleeping well at night.  She has [facility]  Patient states that she experiences some stiffness behind her left knee after sitting for prolonged periods of time.  At times she experiences an aching sensation in her left calf which has been fleeting.  She denies any warmth or swelling in the left knee joint.  She denies any other joint pain or inflammation at this time. She denies any new medical conditions.   Activities of Daily Living:  Patient reports morning stiffness for 5-10 minutes.   Patient Denies nocturnal pain.  Difficulty dressing/grooming: Denies Difficulty climbing stairs: Denies Difficulty getting out of chair: Denies Difficulty using hands for taps, buttons, cutlery, and/or writing: Denies  Review of Systems  Constitutional:  Negative for fatigue.  HENT:  Positive for mouth dryness. Negative for mouth sores.   Eyes:  Positive for dryness.  Respiratory:  Negative for shortness of breath.   Cardiovascular:  Negative for  chest pain and palpitations.  Gastrointestinal:  Positive for constipation. Negative for blood in stool and diarrhea.  Endocrine: Negative for increased urination.  Genitourinary:  Negative for involuntary urination.  Musculoskeletal:  Positive for myalgias, morning stiffness and myalgias. Negative for joint pain, gait problem, joint pain, joint swelling, muscle weakness and muscle tenderness.  Skin:  Positive for hair loss. Negative for color change, rash and sensitivity to sunlight.  Allergic/Immunologic: Negative for susceptible to infections.  Neurological:  Negative for dizziness and headaches.  Hematological:  Negative for swollen glands.  Psychiatric/Behavioral:  Negative for depressed mood and sleep disturbance. The patient is not nervous/anxious.     PMFS History:  Patient Active Problem List   Diagnosis Date Noted   Encounter for Medicare annual examination with abnormal findings 11/14/2022   Murmur, cardiac 11/14/2022   Cervical spondylosis 06/12/2021   Chronic low back pain with left-sided sciatica 04/11/2021   Baker's cyst of knee, left 11/26/2020   Seborrheic dermatitis of scalp 07/19/2020   Essential hypertension 07/19/2020   Overweight (BMI 25.0-29.9) 07/19/2020   Systemic lupus erythematosus arthritis (HCC) 02/12/2020   Allergic sinusitis 11/04/2018   Hyperlipidemia 03/08/2017   Proteinuria 03/08/2017   SLE glomerulonephritis syndrome (HCC) 09/15/2016   High risk medication use 09/15/2016   Degeneration of lumbar intervertebral disc 09/15/2016   Personal history of other endocrine, nutritional and metabolic disease 32/44/0102  History of anemia 09/15/2016   History of proteinuria  09/15/2016   Tubular adenoma of colon 04/30/2016   Prediabetes 11/15/2014   Cervicalgia 05/11/2014   Osteopenia 07/21/2013   Anemia 12/22/2012   Systemic lupus erythematosus (HCC) 02/15/2012   H/O: gastrointestinal disease 10/01/2007    Past Medical History:  Diagnosis Date    Arthritis 02/13/2010   abnormal MRI lumbar and c spine  with disc disease   History of anemia    History of diverticulosis    History of prediabetes    Osteoporosis    SLE (systemic lupus erythematosus) (HCC) dx apprx 1990   reports immunotherapy, cytoxan and steroids at [facility], no f/u for over 58yrs    Family History  Problem Relation Age of Onset   Heart disease Father    Cancer Brother 79       breast   Breast cancer Brother    Cancer Sister 18       breast   Breast cancer Sister    Colon cancer Neg Hx    Past Surgical History:  Procedure Laterality Date   ABDOMINAL HYSTERECTOMY  unsure, over 10 yrs   partial, has had abnormal pap   BLADDER REPAIR     COLONOSCOPY N/A 01/05/2014   Procedure: COLONOSCOPY;  Surgeon: Corbin Ade, MD;  Location: [facility];  Service: Endoscopy;  Laterality: N/A;  12:15 PM   COLONOSCOPY N/A 07/07/2020   Procedure: COLONOSCOPY;  Surgeon: Corbin Ade, MD;  Location: [facility];  Service: Endoscopy;  Laterality: N/A;  ASA II / AM procedure   Social History   Social History Narrative   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   1 grandchild   Immunization History  Administered Date(s) Administered   Fluad Quad(high Dose 65+) 11/18/2018, 01/05/2020, 11/26/2020   Influenza Split 02/14/2012   Influenza Whole 12/28/2000   Influenza,inj,Quad PF,6+ Mos 12/16/2012, 01/06/2014, 01/19/2016, 12/10/2017   Influenza-Unspecified 12/27/2021   Moderna SARS-COV2 Booster Vaccination 04/20/2021   Moderna Sars-Covid-2 Vaccination 05/02/2019, 05/31/2019, 01/29/2020, 10/04/2020   Pneumococcal Conjugate-13 09/25/2017   Pneumococcal Polysaccharide-23 09/30/2018   Td 09/17/2003   Tdap 01/06/2014   Zoster, Live 12/17/2012     Objective: Vital Signs: BP 136/87 (BP Location: Right Arm, Patient Position: Sitting, Cuff Size: Small)   Pulse (!) 13   Resp 13   Ht 5\' 3"  (1.6 m)   Wt 134 lb (60.8 kg)   BMI 23.74 kg/m    Physical Exam Vitals and nursing  note reviewed.  Constitutional:      Appearance: She is well-developed.  HENT:     Head: Normocephalic and atraumatic.  Eyes:     Conjunctiva/sclera: Conjunctivae normal.  Cardiovascular:     Rate and Rhythm: Normal rate and regular rhythm.     Heart sounds: Normal heart sounds.  Pulmonary:     Effort: Pulmonary effort is normal.     Breath sounds: Normal breath sounds.  Abdominal:     General: Bowel sounds are normal.     Palpations: Abdomen is soft.  Musculoskeletal:     Cervical back: Normal range of motion.  Lymphadenopathy:     Cervical: No cervical adenopathy.  Skin:    General: Skin is warm and dry.     Capillary Refill: Capillary refill takes less than 2 seconds.  Neurological:     Mental Status: She is alert and oriented to person, place, and time.  Psychiatric:        Behavior: Behavior normal.  Musculoskeletal Exam: C-spine, thoracic spine, and lumbar spine good ROM.  Shoulder joints, elbow joints, wrist joints, Mcps, PIPs, and DIPs good ROM with no synovitis . Complete fist formation bilaterally.  Hip joints have good ROM with no groin pain.  Knee joints have good ROM with no warmth or effusion.  Ankle joints have good ROM with no tenderness or joint swelling.  CDAI Exam: CDAI Score: -- Patient Global: --; Provider Global: -- Swollen: --; Tender: -- Joint Exam 02/27/2023   No joint exam has been documented for this visit   There is currently no information documented on the homunculus. Go to the Rheumatology activity and complete the homunculus joint exam.  Investigation: No additional findings.  Imaging: No results found.  Recent Labs: Lab Results  Component Value Date   WBC 3.0 (L) 11/01/2022   HGB 12.1 11/01/2022   PLT 155 11/01/2022   NA 141 11/01/2022   K 4.8 11/01/2022   CL 106 11/01/2022   CO2 27 11/01/2022   GLUCOSE 98 11/01/2022   BUN 19 11/01/2022   CREATININE 1.13 (H) 11/01/2022   BILITOT 0.4 11/01/2022   ALKPHOS 43 (L)  04/07/2021   AST 27 11/01/2022   ALT 20 11/01/2022   PROT 7.2 11/01/2022   ALBUMIN 3.7 07/31/2022   CALCIUM 10.1 11/01/2022   GFRAA 47 (L) 11/28/2019   QFTBGOLDPLUS NEGATIVE 08/03/2021    Speciality Comments: Plaquenil eye exam normal on 05/26/2022 [facility] f/u 6 months  Procedures:  No procedures performed Allergies: Sulfa antibiotics and Sulfonamide derivatives     Assessment / Plan:     Visit Diagnoses: Other systemic lupus erythematosus with other organ involvement (HCC) - History of dsDNA +64, Smith + 5.5, RNP+ 6.5, SSA(Ro) Negative, SSB (La) Negative, presented initially with nephritis, hair loss, arthralgias, dermatitis: She has not had any signs or symptoms of a systemic lupus flare.  She has clinically been doing well taking Imuran 50 mg 1 tablet by mouth daily and Plaquenil 200 mg 1 tablet by mouth twice daily Monday through Friday.  She is tolerating combination therapy without any side effects and has not had any interruptions in therapy.  She has no synovitis on examination today.  No symptoms of Raynaud's phenomenon, oral or nasal ulcerations, malar rash, or cervical lymphadenopathy.  She has been using oral rinse and lotions for symptoms of dry mouth.  She has not had any eye dryness.  She continues to follow-up with ophthalmology on a yearly basis. Lab work on 11/01/22: ESR WNL, dsDNA negative, no proteinuria, and complements WNL.  Patient will be having updated lab work at [facility] on 03/09/2023.  She was given future lab orders to take with her to have complements, double-stranded DNA, sed rate, protein creatinine ratio, CBC with differential, and CMP with GFR updated.   Patient will remain on Plaquenil and Imuran as prescribed.  She was advised to notify us if she develops signs or symptoms of a flare.  She will follow-up in the office in 5 months or sooner if needed. - Plan: CBC with Differential/Platelet, COMPLETE METABOLIC PANEL WITH GFR, Protein / creatinine  ratio, urine, Anti-DNA antibody, double-stranded, C3 and C4, Sedimentation rate  Lupus nephritis (HCC) -Treated with Cytoxan IV and prednisone for 1 year.  Followed closely by Dr. Wolfgang Phoenix every 3 months-upcoming appointment scheduled on 03/09/23.  No proteinuria on 11/01/2022.  She will be having upcoming lab work so a future order for protein creatinine ratio was placed today.- Plan: Protein / creatinine ratio,  urine  High risk medication use - Imuran 50 mg 1 tablet daily and plaquenil 200 mg 1 tablet by mouth twice daily M-F.  CBC and CMP updated on 11/01/22.  She will be having updated lab work at [facility] on 03/09/2023.  Future orders for CBC and CMP were placed. Plaquenil eye exam normal on 05/26/2022 [facility].   Plan: CBC with Differential/Platelet, COMPLETE METABOLIC PANEL WITH GFR  Chronic left shoulder pain: Good ROM with no discomfort.    Chronic pain of left knee: No warmth or effusion noted.  She experiences stiffness in the posterior aspect of the left knee after sitting for prolonged periods of time.  No calf tightness, swelling, or tenderness noted today.  No instability noted.   She has been going to exercise class at least 3 days a week at the senior center.   Degeneration of intervertebral disc of lumbar region without discogenic back pain or lower extremity pain: No symptoms of radiculopathy.    Age-related osteoporosis without current pathological fracture - DEXA 01/19/2020: The BMD measured at Forearm Radius 33% is 0.549 g/cm2 with a T-score of -2.3.   She is taking Fosamax 70 mg 1 tablet by mouth once weekly and a calcium and vitamin D supplement daily.    Other medical conditions are listed as follows:   History of hyperlipidemia  History of proteinuria syndrome: Followed by [facility].  Protein creatinine ratio will be updated on 03/09/23.    History of prediabetes  History of diverticulosis  History of anemia  History of renal  insufficiency  History of adenomatous polyp of colon  Orders: Orders Placed This Encounter  Procedures   CBC with Differential/Platelet   COMPLETE METABOLIC PANEL WITH GFR   Protein / creatinine ratio, urine   Anti-DNA antibody, double-stranded   C3 and C4   Sedimentation rate   No orders of the defined types were placed in this encounter.   Follow-Up Instructions: Return in about 5 months (around 07/28/2023) for Systemic lupus erythematosus.   Gearldine Bienenstock, PA-C  Note - This record has been created using Dragon software.  Chart creation errors have been sought, but may not always  have been located. Such creation errors do not reflect on  the standard of medical care.

## 2023-02-27 ENCOUNTER — Encounter: Payer: Self-pay | Admitting: Physician Assistant

## 2023-02-27 ENCOUNTER — Ambulatory Visit: Payer: Medicare PPO | Attending: Physician Assistant | Admitting: Physician Assistant

## 2023-02-27 VITALS — BP 136/87 | HR 13 | Resp 13 | Ht 63.0 in | Wt 134.0 lb

## 2023-02-27 DIAGNOSIS — Z87898 Personal history of other specified conditions: Secondary | ICD-10-CM

## 2023-02-27 DIAGNOSIS — M25562 Pain in left knee: Secondary | ICD-10-CM

## 2023-02-27 DIAGNOSIS — Z860101 Personal history of adenomatous and serrated colon polyps: Secondary | ICD-10-CM

## 2023-02-27 DIAGNOSIS — Z862 Personal history of diseases of the blood and blood-forming organs and certain disorders involving the immune mechanism: Secondary | ICD-10-CM

## 2023-02-27 DIAGNOSIS — M3214 Glomerular disease in systemic lupus erythematosus: Secondary | ICD-10-CM

## 2023-02-27 DIAGNOSIS — Z79899 Other long term (current) drug therapy: Secondary | ICD-10-CM

## 2023-02-27 DIAGNOSIS — Z8639 Personal history of other endocrine, nutritional and metabolic disease: Secondary | ICD-10-CM

## 2023-02-27 DIAGNOSIS — Z8719 Personal history of other diseases of the digestive system: Secondary | ICD-10-CM

## 2023-02-27 DIAGNOSIS — G8929 Other chronic pain: Secondary | ICD-10-CM

## 2023-02-27 DIAGNOSIS — M3219 Other organ or system involvement in systemic lupus erythematosus: Secondary | ICD-10-CM

## 2023-02-27 DIAGNOSIS — M25512 Pain in left shoulder: Secondary | ICD-10-CM | POA: Diagnosis not present

## 2023-02-27 DIAGNOSIS — M810 Age-related osteoporosis without current pathological fracture: Secondary | ICD-10-CM | POA: Diagnosis not present

## 2023-02-27 DIAGNOSIS — M51369 Other intervertebral disc degeneration, lumbar region without mention of lumbar back pain or lower extremity pain: Secondary | ICD-10-CM

## 2023-02-27 DIAGNOSIS — Z87448 Personal history of other diseases of urinary system: Secondary | ICD-10-CM | POA: Diagnosis not present

## 2023-03-06 ENCOUNTER — Ambulatory Visit: Payer: Medicare PPO | Admitting: Family Medicine

## 2023-03-06 ENCOUNTER — Other Ambulatory Visit (HOSPITAL_COMMUNITY)
Admission: RE | Admit: 2023-03-06 | Discharge: 2023-03-06 | Disposition: A | Discharge status: Home / Self Care | Payer: Medicare PPO | Source: Ambulatory Visit | Attending: Nephrology | Admitting: Nephrology

## 2023-03-06 ENCOUNTER — Encounter: Payer: Self-pay | Admitting: Family Medicine

## 2023-03-06 ENCOUNTER — Other Ambulatory Visit: Payer: Self-pay

## 2023-03-06 VITALS — BP 120/70 | HR 73 | Ht 63.0 in | Wt 132.1 lb

## 2023-03-06 DIAGNOSIS — R7303 Prediabetes: Secondary | ICD-10-CM | POA: Diagnosis not present

## 2023-03-06 DIAGNOSIS — D631 Anemia in chronic kidney disease: Secondary | ICD-10-CM | POA: Insufficient documentation

## 2023-03-06 DIAGNOSIS — M329 Systemic lupus erythematosus, unspecified: Secondary | ICD-10-CM | POA: Insufficient documentation

## 2023-03-06 DIAGNOSIS — M3214 Glomerular disease in systemic lupus erythematosus: Secondary | ICD-10-CM | POA: Diagnosis not present

## 2023-03-06 DIAGNOSIS — M3219 Other organ or system involvement in systemic lupus erythematosus: Secondary | ICD-10-CM

## 2023-03-06 DIAGNOSIS — Z780 Asymptomatic menopausal state: Secondary | ICD-10-CM | POA: Diagnosis not present

## 2023-03-06 DIAGNOSIS — E782 Mixed hyperlipidemia: Secondary | ICD-10-CM | POA: Diagnosis not present

## 2023-03-06 DIAGNOSIS — I129 Hypertensive chronic kidney disease with stage 1 through stage 4 chronic kidney disease, or unspecified chronic kidney disease: Secondary | ICD-10-CM | POA: Insufficient documentation

## 2023-03-06 DIAGNOSIS — M8580 Other specified disorders of bone density and structure, unspecified site: Secondary | ICD-10-CM

## 2023-03-06 DIAGNOSIS — E559 Vitamin D deficiency, unspecified: Secondary | ICD-10-CM | POA: Diagnosis not present

## 2023-03-06 DIAGNOSIS — I43 Cardiomyopathy in diseases classified elsewhere: Secondary | ICD-10-CM

## 2023-03-06 DIAGNOSIS — N1831 Chronic kidney disease, stage 3a: Secondary | ICD-10-CM | POA: Diagnosis not present

## 2023-03-06 DIAGNOSIS — E211 Secondary hyperparathyroidism, not elsewhere classified: Secondary | ICD-10-CM | POA: Diagnosis not present

## 2023-03-06 DIAGNOSIS — I10 Essential (primary) hypertension: Secondary | ICD-10-CM | POA: Diagnosis not present

## 2023-03-06 DIAGNOSIS — Z79899 Other long term (current) drug therapy: Secondary | ICD-10-CM

## 2023-03-06 DIAGNOSIS — Z23 Encounter for immunization: Secondary | ICD-10-CM | POA: Diagnosis not present

## 2023-03-06 DIAGNOSIS — L219 Seborrheic dermatitis, unspecified: Secondary | ICD-10-CM | POA: Diagnosis not present

## 2023-03-06 DIAGNOSIS — R809 Proteinuria, unspecified: Secondary | ICD-10-CM | POA: Diagnosis not present

## 2023-03-06 LAB — PROTEIN / CREATININE RATIO, URINE
Creatinine, Urine: 31 mg/dL
Total Protein, Urine: <6 mg/dL

## 2023-03-06 LAB — RENAL FUNCTION PANEL
Albumin: 4 g/dL (ref 3.5–5.0)
Anion gap: 10 (ref 5–15)
BUN: 18 mg/dL (ref 8–23)
CO2: 27 mmol/L (ref 22–32)
Calcium: 10.2 mg/dL (ref 8.9–10.3)
Chloride: 101 mmol/L (ref 98–111)
Creatinine, Ser: 1.17 mg/dL — ABNORMAL HIGH (ref 0.44–1.00)
GFR, Estimated: 50 mL/min — ABNORMAL LOW (ref >60)
Glucose, Bld: 85 mg/dL (ref 70–99)
Phosphorus: 4.4 mg/dL (ref 2.5–4.6)
Potassium: 4.2 mmol/L (ref 3.5–5.1)
Sodium: 138 mmol/L (ref 135–145)

## 2023-03-06 LAB — URINALYSIS, W/ REFLEX TO CULTURE (INFECTION SUSPECTED)
Bacteria, UA: NONE SEEN
Bilirubin Urine: NEGATIVE
Glucose, UA: NEGATIVE mg/dL
Hgb urine dipstick: NEGATIVE
Ketones, ur: NEGATIVE mg/dL
Leukocytes,Ua: NEGATIVE
Nitrite: NEGATIVE
Protein, ur: NEGATIVE mg/dL
Specific Gravity, Urine: 1.004 — ABNORMAL LOW (ref 1.005–1.030)
pH: 6 (ref 5.0–8.0)

## 2023-03-06 LAB — CBC
HCT: 40.7 % (ref 36.0–46.0)
Hemoglobin: 13.1 g/dL (ref 12.0–15.0)
MCH: 28.2 pg (ref 26.0–34.0)
MCHC: 32.2 g/dL (ref 30.0–36.0)
MCV: 87.7 fL (ref 80.0–100.0)
Platelets: 173 10*3/uL (ref 150–400)
RBC: 4.64 MIL/uL (ref 3.87–5.11)
RDW: 14.7 % (ref 11.5–15.5)
WBC: 4 10*3/uL (ref 4.0–10.5)
nRBC: 0 % (ref 0.0–0.2)

## 2023-03-06 LAB — VITAMIN D 25 HYDROXY (VIT D DEFICIENCY, FRACTURES): Vit D, 25-Hydroxy: 53.85 ng/mL (ref 30–100)

## 2023-03-06 NOTE — Patient Instructions (Addendum)
F/u in 5 months, call if you need me sooner  Flu vaccine today  Need to get covid vaccine at pharmacy  Please schedule  bone density and mammogram at checkout  hBA1c, eSR, TC and C4, DSDNA, fasting lipid panel in the next 1 to 2 weeks   Keep up the great work  No medication changes  Thanks for choosing [facility], we consider it a privelige to serve you.   Best for 2025!

## 2023-03-07 LAB — PTH, INTACT AND CALCIUM
Calcium, Total (PTH): 10.3 mg/dL (ref 8.7–10.3)
PTH: 13 pg/mL — ABNORMAL LOW (ref 15–65)

## 2023-03-09 DIAGNOSIS — D72819 Decreased white blood cell count, unspecified: Secondary | ICD-10-CM | POA: Diagnosis not present

## 2023-03-09 DIAGNOSIS — I129 Hypertensive chronic kidney disease with stage 1 through stage 4 chronic kidney disease, or unspecified chronic kidney disease: Secondary | ICD-10-CM | POA: Diagnosis not present

## 2023-03-09 DIAGNOSIS — M329 Systemic lupus erythematosus, unspecified: Secondary | ICD-10-CM | POA: Diagnosis not present

## 2023-03-09 DIAGNOSIS — N1831 Chronic kidney disease, stage 3a: Secondary | ICD-10-CM | POA: Diagnosis not present

## 2023-03-19 DIAGNOSIS — Z780 Asymptomatic menopausal state: Secondary | ICD-10-CM | POA: Insufficient documentation

## 2023-03-19 DIAGNOSIS — Z23 Encounter for immunization: Secondary | ICD-10-CM | POA: Insufficient documentation

## 2023-03-19 DIAGNOSIS — M3214 Glomerular disease in systemic lupus erythematosus: Secondary | ICD-10-CM | POA: Insufficient documentation

## 2023-03-19 NOTE — Assessment & Plan Note (Signed)
Updated dexa needed

## 2023-03-19 NOTE — Assessment & Plan Note (Signed)
Controlled, no change in medication DASH diet and commitment to daily physical activity for a minimum of 30 minutes discussed and encouraged, as a part of hypertension management. The importance of attaining a healthy weight is also discussed.     03/06/2023    3:50 PM 03/06/2023    3:13 PM 02/27/2023    7:53 AM 11/14/2022    4:19 PM 11/14/2022    4:18 PM 11/14/2022    2:54 PM 11/01/2022    9:23 AM  BP/Weight  Systolic BP 120 116 136 140 142 135 120  Diastolic BP 70 77 87 82 82 79 74  Wt. (Lbs)  132.08 134   136.08 135.2  BMI  23.4 kg/m2 23.74 kg/m2   24.11 kg/m2 23.95 kg/m2

## 2023-03-19 NOTE — Assessment & Plan Note (Addendum)
Xspecific lbs requested by Rheumatology re ordered for patient's convenience and will be sent to Rheumatology foe f/u

## 2023-03-19 NOTE — Assessment & Plan Note (Signed)
Updated dexa needed, encouraged regular  weight bearing exercise and calcium with D supplement daily

## 2023-03-19 NOTE — Assessment & Plan Note (Signed)
After obtaining informed consent, the influenza vaccine is  administered , with no adverse effect noted at the time of administration.

## 2023-03-19 NOTE — Progress Notes (Signed)
[name]     MRN: [id number]      DOB: 1950/11/04  Chief Complaint  Patient presents with   Follow-up    Follow up    HPI [name] is here for follow up and re-evaluation of chronic medical conditions, medication management and review of any available recent lab and radiology data.  Preventive health is updated, specifically  Cancer screening and Immunization.   Questions or concerns regarding consultations or procedures which the PT has had in the interim are  addressed. The PT denies any adverse reactions to current medications since the last visit.  There are no new concerns.  There are no specific complaints   ROS Denies recent fever or chills. Denies sinus pressure, nasal congestion, ear pain or sore throat. Denies chest congestion, productive cough or wheezing. Denies chest pains, palpitations and leg swelling Denies abdominal pain, nausea, vomiting,diarrhea or constipation.   Denies dysuria, frequency, hesitancy or incontinence. Denies joint pain, swelling and limitation in mobility. Denies headaches, seizures, numbness, or tingling. Denies depression, anxiety or insomnia. Denies skin break down or rash.   PE  BP 120/70   Pulse 73   Ht 5\' 3"  (1.6 m)   Wt 132 lb 1.3 oz (59.9 kg)   SpO2 98%   BMI 23.40 kg/m   Patient alert and oriented and in no cardiopulmonary distress.  HEENT: No facial asymmetry, EOMI,     Neck supple .  Chest: Clear to auscultation bilaterally.  CVS: S1, S2 no murmurs, no S3.Regular rate.  ABD: Soft non tender.   Ext: No edema  MS: Adequate ROM spine, shoulders, hips and knees.  Skin: Intact, no ulcerations or rash noted.  Psych: Good eye contact, normal affect. Memory intact not anxious or depressed appearing.  CNS: CN 2-12 intact, power,  normal throughout.no focal deficits noted.   Assessment & Plan  Essential hypertension Controlled, no change in medication DASH diet and commitment to daily physical activity for a  minimum of 30 minutes discussed and encouraged, as a part of hypertension management. The importance of attaining a healthy weight is also discussed.     03/06/2023    3:50 PM 03/06/2023    3:13 PM 02/27/2023    7:53 AM 11/14/2022    4:19 PM 11/14/2022    4:18 PM 11/14/2022    2:54 PM 11/01/2022    9:23 AM  BP/Weight  Systolic BP 120 116 136 140 142 135 120  Diastolic BP 70 77 87 82 82 79 74  Wt. (Lbs)  132.08 134   136.08 135.2  BMI  23.4 kg/m2 23.74 kg/m2   24.11 kg/m2 23.95 kg/m2       Prediabetes Patient educated about the importance of limiting  Carbohydrate intake , the need to commit to daily physical activity for a minimum of 30 minutes , and to commit weight loss. The fact that changes in all these areas will reduce or eliminate all together the development of diabetes is stressed.      Latest Ref Rng & Units 03/06/2023   11:28 AM 11/23/2022    8:49 AM 11/01/2022   10:12 AM 07/31/2022    8:51 AM 04/21/2022    8:07 AM  Diabetic Labs  HbA1c 4.8 - 5.6 %  6.4    6.2   Chol 0 - 200 mg/dL  295    621   HDL >30 mg/dL  41    42   Calc LDL 0 - 99 mg/dL  865  127   Triglycerides <150 mg/dL  366    440   Creatinine 0.44 - 1.00 mg/dL 3.47   4.25  9.56        03/06/2023    3:50 PM 03/06/2023    3:13 PM 02/27/2023    7:53 AM 11/14/2022    4:19 PM 11/14/2022    4:18 PM 11/14/2022    2:54 PM 11/01/2022    9:23 AM  BP/Weight  Systolic BP 120 116 136 140 142 135 120  Diastolic BP 70 77 87 82 82 79 74  Wt. (Lbs)  132.08 134   136.08 135.2  BMI  23.4 kg/m2 23.74 kg/m2   24.11 kg/m2 23.95 kg/m2       No data to display          Updated lab needed at/ before next visit.   Hyperlipidemia Hyperlipidemia:Low fat diet discussed and encouraged.   Lipid Panel  Lab Results  Component Value Date   CHOL 189 11/23/2022   HDL 41 11/23/2022   LDLCALC 128 (H) 11/23/2022   TRIG 102 11/23/2022   CHOLHDL 4.6 11/23/2022     Updated lab needed at/ before next visit.   Systemic  lupus erythematosus (HCC) On methotrexate and followed closely by rheumatology, no new symptoms , stable , labs to be updated  SLE glomerulonephritis syndrome (HCC) Xspecific lbs requested by Rheumatology re ordered for patient's convenience and will be sent to Rheumatology foe f/u  Osteopenia Updated dexa needed  Post-menopause Updated dexa needed, encouraged regular  weight bearing exercise and calcium with D supplement daily  Seborrheic dermatitis of scalp Using otc oil, has stopped prescription med , states ineffective  Encounter for immunization After obtaining informed consent, the influenza vaccine is  administered , with no adverse effect noted at the time of administration.

## 2023-03-19 NOTE — Assessment & Plan Note (Signed)
Using otc oil, has stopped prescription med , states ineffective

## 2023-03-19 NOTE — Assessment & Plan Note (Signed)
On methotrexate and followed closely by rheumatology, no new symptoms , stable , labs to be updated

## 2023-03-19 NOTE — Assessment & Plan Note (Signed)
Patient educated about the importance of limiting  Carbohydrate intake , the need to commit to daily physical activity for a minimum of 30 minutes , and to commit weight loss. The fact that changes in all these areas will reduce or eliminate all together the development of diabetes is stressed.      Latest Ref Rng & Units 03/06/2023   11:28 AM 11/23/2022    8:49 AM 11/01/2022   10:12 AM 07/31/2022    8:51 AM 04/21/2022    8:07 AM  Diabetic Labs  HbA1c 4.8 - 5.6 %  6.4    6.2   Chol 0 - 200 mg/dL  161    096   HDL >04 mg/dL  41    42   Calc LDL 0 - 99 mg/dL  540    981   Triglycerides <150 mg/dL  191    478   Creatinine 0.44 - 1.00 mg/dL 2.95   6.21  3.08        03/06/2023    3:50 PM 03/06/2023    3:13 PM 02/27/2023    7:53 AM 11/14/2022    4:19 PM 11/14/2022    4:18 PM 11/14/2022    2:54 PM 11/01/2022    9:23 AM  BP/Weight  Systolic BP 120 116 136 140 142 135 120  Diastolic BP 70 77 87 82 82 79 74  Wt. (Lbs)  132.08 134   136.08 135.2  BMI  23.4 kg/m2 23.74 kg/m2   24.11 kg/m2 23.95 kg/m2       No data to display          Updated lab needed at/ before next visit.

## 2023-03-19 NOTE — Assessment & Plan Note (Signed)
Hyperlipidemia:Low fat diet discussed and encouraged.   Lipid Panel  Lab Results  Component Value Date   CHOL 189 11/23/2022   HDL 41 11/23/2022   LDLCALC 128 (H) 11/23/2022   TRIG 102 11/23/2022   CHOLHDL 4.6 11/23/2022     Updated lab needed at/ before next visit.

## 2023-03-23 NOTE — Progress Notes (Deleted)
CARDIOLOGY CONSULT NOTE       Patient ID: [name] MRN: [id number] DOB/AGE: 12/11/1950 72 y.o.  Admit date: (Not on file) Referring Physician: Lodema Wilson Primary Physician: Ariel Perches, MD Primary Cardiologist: New Reason for Consultation: Murmur  Active Problems:   * No active hospital problems. *   HPI:  72 y.o. referred by Dr [name] for murmur. History of SLE prior cytoxan and steroid Rx. Now on imuran and plaquenil. Low dose norvasc for HTN.    Of note primary office note 03/06/23 does not mention murmur on exam Note from 11/14/22 says possible murmur but not noted in exam   ***  ROS All other systems reviewed and negative except as noted above  Past Medical History:  Diagnosis Date   Arthritis 02/13/2010   abnormal MRI lumbar and c spine  with disc disease   History of anemia    History of diverticulosis    History of prediabetes    Osteoporosis    SLE (systemic lupus erythematosus) (HCC) dx apprx 1990   reports immunotherapy, cytoxan and steroids at [facility], no f/u for over 48yrs    Family History  Problem Relation Age of Onset   Heart disease Father    Cancer Brother 69       breast   Breast cancer Brother    Cancer Sister 58       breast   Breast cancer Sister    Colon cancer Neg Hx     Social History   Socioeconomic History   Marital status: Married    Spouse name: [name]   Number of children: 3   Years of education: Not on file   Highest education level: Not on file  Occupational History   Not on file  Tobacco Use   Smoking status: Never    Passive exposure: Never   Smokeless tobacco: Never  Vaping Use   Vaping status: Never Used  Substance and Sexual Activity   Alcohol use: No   Drug use: No   Sexual activity: Not Currently  Other Topics Concern   Not on file  Social History Narrative   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   1 grandchild   Social Drivers of Corporate investment banker Strain: Low Risk   (01/05/2021)   Overall Financial Resource Strain (CARDIA)    Difficulty of Paying Living Expenses: Not hard at all  Food Insecurity: No Food Insecurity (10/28/2022)   Hunger Vital Sign    Worried About Running Out of Food in the Last Year: Never true    Ran Out of Food in the Last Year: Never true  Transportation Needs: No Transportation Needs (10/28/2022)   PRAPARE - Administrator, Civil Service (Medical): No    Lack of Transportation (Non-Medical): No  Physical Activity: Sufficiently Active (01/05/2021)   Exercise Vital Sign    Days of Exercise per Week: 5 days    Minutes of Exercise per Session: 30 min  Stress: No Stress Concern Present (01/05/2021)   [employer] of Occupational Health - Occupational Stress Questionnaire    Feeling of Stress : Not at all  Social Connections: Socially Integrated (01/05/2021)   Social Connection and Isolation Panel [NHANES]    Frequency of Communication with Friends and Family: More than three times a week    Frequency of Social Gatherings with Friends and Family: More than three times a week    Attends Religious Services: More than 4 times per year  Active Member of Clubs or Organizations: Yes    Attends Banker Meetings: More than 4 times per year    Marital Status: Married  Catering manager Violence: Not At Risk (10/28/2022)   Humiliation, Afraid, Rape, and Kick questionnaire    Fear of Current or Ex-Partner: No    Emotionally Abused: No    Physically Abused: No    Sexually Abused: No    Past Surgical History:  Procedure Laterality Date   ABDOMINAL HYSTERECTOMY  unsure, over 10 yrs   partial, has had abnormal pap   BLADDER REPAIR     COLONOSCOPY N/A 01/05/2014   Procedure: COLONOSCOPY;  Surgeon: Corbin Ade, MD;  [facility];  Service: Endoscopy;  Laterality: N/A;  12:15 PM   COLONOSCOPY N/A 07/07/2020   Procedure: COLONOSCOPY;  Surgeon: Corbin Ade, MD;  [facility]: [facility];  Service:  Endoscopy;  Laterality: N/A;  ASA II / AM procedure      Current Outpatient Medications:    alendronate (FOSAMAX) 70 MG tablet, TAKE 1 TABLET BY MOUTH WITH A FULL GLASSOF WATER ON AN EMPTY STOMACH ONCE A WEEK, Disp: 12 tablet, Rfl: 1   amLODipine (NORVASC) 2.5 MG tablet, TAKE ONE TABLET (2.5MG  TOTAL) BY MOUTH DAILY, Disp: 90 tablet, Rfl: 3   azaTHIOprine (IMURAN) 50 MG tablet, TAKE ONE TABLET (50MG  TOTAL) BY MOUTH DAILY, Disp: 90 tablet, Rfl: 0   Calcium Carb-Cholecalciferol (CALCIUM 600+D) 600-800 MG-UNIT TABS, Take 2 tablets by mouth daily., Disp: , Rfl:    cholecalciferol (VITAMIN D3) 25 MCG (1000 UNIT) tablet, Take 2,000 Units by mouth daily., Disp: , Rfl:    Cyanocobalamin (B-12 PO), Take 1 tablet by mouth daily., Disp: , Rfl:    ELDERBERRY PO, Take by mouth., Disp: , Rfl:    hydroxychloroquine (PLAQUENIL) 200 MG tablet, TAKE ONE TABLET BY MOUTH TWICE A DAY MONDAY THROUGH FRIDAY., Disp: 120 tablet, Rfl: 0   Multiple Vitamins-Minerals (SYSTANE ICAPS AREDS2) CAPS, Take 2 capsules by mouth daily., Disp: , Rfl:    VITAMIN A PO, Take 1 capsule by mouth daily., Disp: , Rfl:    vitamin C (ASCORBIC ACID) 500 MG tablet, Take 1,000 mg by mouth daily., Disp: , Rfl:     Physical Exam: There were no vitals taken for this visit.    Affect appropriate Healthy:  appears stated age HEENT: normal Neck supple with no adenopathy JVP normal no bruits no thyromegaly Lungs clear with no wheezing and good diaphragmatic motion Heart:  S1/S2 *** murmur, no rub, gallop or click PMI normal Abdomen: benighn, BS positve, no tenderness, no AAA no bruit.  No HSM or HJR Distal pulses intact with no bruits No edema Neuro non-focal Skin warm and dry No muscular weakness   Labs:   Lab Results  Component Value Date   WBC 4.0 03/06/2023   HGB 13.1 03/06/2023   HCT 40.7 03/06/2023   MCV 87.7 03/06/2023   PLT 173 03/06/2023   No results for input(s): "NA", "K", "CL", "CO2", "BUN", "CREATININE",  "CALCIUM", "PROT", "BILITOT", "ALKPHOS", "ALT", "AST", "GLUCOSE" in the last 168 hours.  Invalid input(s): "LABALBU" Lab Results  Component Value Date   CKTOTAL 145 (H) 09/19/2016    Lab Results  Component Value Date   CHOL 189 11/23/2022   CHOL 189 04/21/2022   CHOL 192 10/11/2021   Lab Results  Component Value Date   HDL 41 11/23/2022   HDL 42 04/21/2022   HDL 40 10/11/2021   Lab Results  Component Value Date   LDLCALC 128 (H) 11/23/2022   LDLCALC 127 (H) 04/21/2022   LDLCALC 126 (H) 10/11/2021   Lab Results  Component Value Date   TRIG 102 11/23/2022   TRIG 111 04/21/2022   TRIG 146 10/11/2021   Lab Results  Component Value Date   CHOLHDL 4.6 11/23/2022   CHOLHDL 4.5 (H) 04/21/2022   CHOLHDL 4.8 (H) 10/11/2021   No results found for: "LDLDIRECT"    Radiology: No results found.  EKG: ***   ASSESSMENT AND PLAN:   Murmur:  ***  will get echo regardless given SLE and HTN SLE:  f/u rheumatology on imuran and plaquenil HTN:  continue norvasc  TTE  F/U PRN pending echo results   Signed: Charlton Haws 03/23/2023, 12:32 PM

## 2023-03-30 ENCOUNTER — Ambulatory Visit: Payer: Medicare PPO | Admitting: Cardiovascular Disease

## 2023-04-04 ENCOUNTER — Other Ambulatory Visit: Payer: Self-pay | Admitting: Physician Assistant

## 2023-04-04 DIAGNOSIS — M3219 Other organ or system involvement in systemic lupus erythematosus: Secondary | ICD-10-CM

## 2023-04-04 NOTE — Telephone Encounter (Signed)
 Last Fill: 11/01/2022  Labs: 03/06/2023 CBC normal, 11/01/2022 No proteinuria. ESR WNL WBC count is low-3.0.  absolute lymphocytes are low. We will continue to monitor closely. Creatinine remains elevated-1.13 and GFR Low-52. Avoid the use of NSAIDs. Forward results to nephrologist as requested. Complements WNL  I called patient, patient is going to PCP next week to have labs drawn.  Next Visit: 05/29/2022  Last Visit: 02/27/2023  DX: Other systemic lupus erythematosus with other organ involvement   Current Dose per office note 02/27/2023: Imuran  50 mg 1 tablet daily   Okay to refill Imuran ?

## 2023-04-11 ENCOUNTER — Other Ambulatory Visit: Payer: Self-pay | Admitting: Physician Assistant

## 2023-04-11 DIAGNOSIS — E782 Mixed hyperlipidemia: Secondary | ICD-10-CM | POA: Diagnosis not present

## 2023-04-11 DIAGNOSIS — M3219 Other organ or system involvement in systemic lupus erythematosus: Secondary | ICD-10-CM | POA: Diagnosis not present

## 2023-04-11 DIAGNOSIS — Z79899 Other long term (current) drug therapy: Secondary | ICD-10-CM | POA: Diagnosis not present

## 2023-04-11 DIAGNOSIS — R7303 Prediabetes: Secondary | ICD-10-CM | POA: Diagnosis not present

## 2023-04-11 DIAGNOSIS — M3214 Glomerular disease in systemic lupus erythematosus: Secondary | ICD-10-CM | POA: Diagnosis not present

## 2023-04-12 LAB — SPECIMEN STATUS REPORT

## 2023-04-13 ENCOUNTER — Telehealth: Payer: Self-pay | Admitting: *Deleted

## 2023-04-13 DIAGNOSIS — Z79899 Other long term (current) drug therapy: Secondary | ICD-10-CM

## 2023-04-13 LAB — CBC/DIFF AMBIGUOUS DEFAULT
Basophils Absolute: 0 10*3/uL (ref 0.0–0.2)
Basos: 1 %
EOS (ABSOLUTE): 0.1 10*3/uL (ref 0.0–0.4)
Eos: 2 %
Hematocrit: 40.2 % (ref 34.0–46.6)
Hemoglobin: 12.9 g/dL (ref 11.1–15.9)
Immature Grans (Abs): 0 10*3/uL (ref 0.0–0.1)
Immature Granulocytes: 0 %
Lymphocytes Absolute: 0.9 10*3/uL (ref 0.7–3.1)
Lymphs: 30 %
MCH: 28 pg (ref 26.6–33.0)
MCHC: 32.1 g/dL (ref 31.5–35.7)
MCV: 87 fL (ref 79–97)
Monocytes Absolute: 0.4 10*3/uL (ref 0.1–0.9)
Monocytes: 12 %
Neutrophils Absolute: 1.6 10*3/uL (ref 1.4–7.0)
Neutrophils: 55 %
Platelets: 156 10*3/uL (ref 150–450)
RBC: 4.61 x10E6/uL (ref 3.77–5.28)
RDW: 13.5 % (ref 11.7–15.4)
WBC: 2.9 10*3/uL — ABNORMAL LOW (ref 3.4–10.8)

## 2023-04-13 LAB — COMPREHENSIVE METABOLIC PANEL
Albumin: 4.2 g/dL (ref 3.9–4.9)
BUN/Creatinine Ratio: 17 (ref 12–28)
Bilirubin Total: 0.4 mg/dL (ref 0.0–1.2)
CO2: 24 mmol/L (ref 20–29)
Calcium: 10.2 mg/dL (ref 8.7–10.3)
Chloride: 103 mmol/L (ref 96–106)
Glucose: 86 mg/dL (ref 70–99)
Sodium: 141 mmol/L (ref 134–144)

## 2023-04-13 LAB — C3 AND C4
Complement C3, Serum: 114 mg/dL (ref 82–167)
Complement C4, Serum: 21 mg/dL (ref 12–38)

## 2023-04-13 LAB — LIPID PANEL
Chol/HDL Ratio: 4.2 {ratio} (ref 0.0–4.4)
Cholesterol, Total: 189 mg/dL (ref 100–199)
HDL: 45 mg/dL (ref >39)
LDL Chol Calc (NIH): 125 mg/dL — ABNORMAL HIGH (ref 0–99)
Triglycerides: 105 mg/dL (ref 0–149)
VLDL Cholesterol Cal: 19 mg/dL (ref 5–40)

## 2023-04-13 LAB — PROTEIN / CREATININE RATIO, URINE
Creatinine, Urine: 65.9 mg/dL
Protein, Ur: 4.4 mg/dL
Protein/Creat Ratio: 67 mg/g{creat} (ref 0–200)

## 2023-04-13 LAB — COMPREHENSIVE METABOLIC PANEL WITH GFR
ALT: 18 IU/L (ref 0–32)
AST: 26 IU/L (ref 0–40)
Alkaline Phosphatase: 41 IU/L — ABNORMAL LOW (ref 44–121)
BUN: 19 mg/dL (ref 8–27)
Creatinine, Ser: 1.12 mg/dL — ABNORMAL HIGH (ref 0.57–1.00)
Globulin, Total: 2.9 g/dL (ref 1.5–4.5)
Potassium: 5 mmol/L (ref 3.5–5.2)
Total Protein: 7.1 g/dL (ref 6.0–8.5)
eGFR: 53 mL/min/{1.73_m2} — ABNORMAL LOW (ref >59)

## 2023-04-13 LAB — HEMOGLOBIN A1C
Est. average glucose Bld gHb Est-mCnc: 126 mg/dL
Hgb A1c MFr Bld: 6 % — ABNORMAL HIGH (ref 4.8–5.6)

## 2023-04-13 LAB — ANA+ENA+DNA/DS+SCL 70+SJOSSA/B
ANA Titer 1: NEGATIVE
ENA RNP Ab: 5 AI — ABNORMAL HIGH (ref 0.0–0.9)
ENA SM Ab Ser-aCnc: <0.2 AI (ref 0.0–0.9)
ENA SSA (RO) Ab: <0.2 AI (ref 0.0–0.9)
ENA SSB (LA) Ab: <0.2 AI (ref 0.0–0.9)
Scleroderma (Scl-70) (ENA) Antibody, IgG: <0.2 AI (ref 0.0–0.9)
dsDNA Ab: 3 [IU]/mL (ref 0–9)

## 2023-04-13 LAB — SEDIMENTATION RATE: Sed Rate: 8 mm/h (ref 0–40)

## 2023-04-13 NOTE — Telephone Encounter (Signed)
Patient advised RNP remains positive.  ANA negative.  Complements WNL. ESR WNL. Ro and La antibodies negative and smith antibody is negative.   Labs are not consistent with a flare.  Patient expressed understanding.

## 2023-04-13 NOTE — Progress Notes (Signed)
Creatinine remains elevated-1.12 and GFR is low-53. Avoid the use of NSAIDs and forward results to her nephrologist.   Alk phos is borderline low-we will continue to monitor for now.   WBC count is low-2.9.  rest of CBC Wnl.  Any change in medications? Recent infection? Recommend rechecking CBC with diff in 1 month.

## 2023-04-13 NOTE — Telephone Encounter (Signed)
-----   Message from Gearldine Bienenstock sent at 04/13/2023 12:09 PM EST ----- Regarding: FW: labs I attempted to call the patient to review lab results RNP remains positive.  ANA negative.  Complements WNL. ESR WNL. Ro and La antibodies negative and smith antibody is negative.   Labs are not consistent with a flare. ----- Message ----- From: Kerri Perches, MD Sent: 04/13/2023   7:08 AM EST To: Gearldine Bienenstock, PA-C Subject: labs                                           Pls see signed results pt recently had , some are very specifically Rheumatology, thanks

## 2023-04-13 NOTE — Telephone Encounter (Signed)
-----   Message from Gearldine Bienenstock sent at 04/13/2023  7:00 AM EST ----- Creatinine remains elevated-1.12 and GFR is low-53. Avoid the use of NSAIDs and forward results to her nephrologist.   Alk phos is borderline low-we will continue to monitor for now.   WBC count is low-2.9.  rest of CBC Wnl.  Any change in medications? Recent infection? Recommend rechecking CBC with diff in 1 month.

## 2023-04-16 NOTE — Progress Notes (Signed)
Urine protein creatinine ratio WNL

## 2023-04-23 ENCOUNTER — Other Ambulatory Visit: Payer: Self-pay | Admitting: Family Medicine

## 2023-04-23 ENCOUNTER — Other Ambulatory Visit: Payer: Self-pay | Admitting: Rheumatology

## 2023-04-23 DIAGNOSIS — M3219 Other organ or system involvement in systemic lupus erythematosus: Secondary | ICD-10-CM

## 2023-04-23 NOTE — Telephone Encounter (Signed)
Last Fill: 01/12/2023  Eye exam: normal on 05/26/2022   Labs: 04/11/2023 Creatinine remains elevated-1.12 and GFR is low-53. Alk phos is borderline low. WBC count is low-2.9.  rest of CBC Wnl.   Next Visit: 05/29/2023  Last Visit: 02/27/2023  DX: Other systemic lupus erythematosus with other organ involvement   Current Dose per office note 02/27/2023: plaquenil 200 mg 1 tablet by mouth twice daily M-F.   Okay to refill Plaquenil?

## 2023-04-27 ENCOUNTER — Ambulatory Visit (HOSPITAL_COMMUNITY)
Admission: RE | Admit: 2023-04-27 | Discharge: 2023-04-27 | Disposition: A | Discharge status: Home / Self Care | Payer: Medicare PPO | Source: Ambulatory Visit | Attending: Family Medicine | Admitting: Family Medicine

## 2023-04-27 ENCOUNTER — Encounter (HOSPITAL_COMMUNITY): Payer: Self-pay

## 2023-04-27 DIAGNOSIS — M8589 Other specified disorders of bone density and structure, multiple sites: Secondary | ICD-10-CM | POA: Diagnosis not present

## 2023-04-27 DIAGNOSIS — Z1231 Encounter for screening mammogram for malignant neoplasm of breast: Secondary | ICD-10-CM | POA: Diagnosis not present

## 2023-04-27 DIAGNOSIS — M8580 Other specified disorders of bone density and structure, unspecified site: Secondary | ICD-10-CM | POA: Insufficient documentation

## 2023-04-27 DIAGNOSIS — Z780 Asymptomatic menopausal state: Secondary | ICD-10-CM

## 2023-05-02 ENCOUNTER — Encounter: Payer: Self-pay | Admitting: Cardiology

## 2023-05-02 ENCOUNTER — Ambulatory Visit: Payer: Medicare PPO | Attending: Cardiology | Admitting: Cardiology

## 2023-05-02 VITALS — BP 118/74 | HR 55 | Ht 63.0 in | Wt 135.4 lb

## 2023-05-02 DIAGNOSIS — R011 Cardiac murmur, unspecified: Secondary | ICD-10-CM

## 2023-05-02 DIAGNOSIS — I10 Essential (primary) hypertension: Secondary | ICD-10-CM | POA: Diagnosis not present

## 2023-05-02 NOTE — Patient Instructions (Signed)
 Medication Instructions:  Your physician recommends that you continue on your current medications as directed. Please refer to the Current Medication list given to you today.  *If you need a refill on your cardiac medications before your next appointment, please call your pharmacy*   Lab Work: None If you have labs (blood work) drawn today and your tests are completely normal, you will receive your results only by: MyChart Message (if you have MyChart) OR A paper copy in the mail If you have any lab test that is abnormal or we need to change your treatment, we will call you to review the results.   Testing/Procedures: Your physician has requested that you have an echocardiogram. Echocardiography is a painless test that uses sound waves to create images of your heart. It provides your doctor with information about the size and shape of your heart and how well your heart's chambers and valves are working. This procedure takes approximately one hour. There are no restrictions for this procedure. Please do NOT wear cologne, perfume, aftershave, or lotions (deodorant is allowed). Please arrive 15 minutes prior to your appointment time.  Please note: We ask at that you not bring children with you during ultrasound (echo/ vascular) testing. Due to room size and safety concerns, children are not allowed in the ultrasound rooms during exams. Our front office staff cannot provide observation of children in our lobby area while testing is being conducted. An adult accompanying a patient to their appointment will only be allowed in the ultrasound room at the discretion of the ultrasound technician under special circumstances. We apologize for any inconvenience.    Follow-Up: At [facility], you and your health needs are our priority.  As part of our continuing mission to provide you with exceptional heart care, we have created designated Provider Care Teams.  These Care Teams include your  primary Cardiologist (physician) and Advanced Practice Providers (APPs -  Physician Assistants and Nurse Practitioners) who all work together to provide you with the care you need, when you need it.  We recommend signing up for the patient portal called MyChart.  Sign up information is provided on this After Visit Summary.  MyChart is used to connect with patients for Virtual Visits (Telemedicine).  Patients are able to view lab/test results, encounter notes, upcoming appointments, etc.  Non-urgent messages can be sent to your provider as well.   To learn more about what you can do with MyChart, go to [email or web address].    Your next appointment:    Follow up to be determined based on testing results.   Provider:   You may see Alvan Carrier, MD or one of the following Advanced Practice Providers on your designated Care Team:   Laymon Qua, PA-C  Olivia Pavy, [place]     Other Instructions

## 2023-05-02 NOTE — Progress Notes (Signed)
 Clinical Summary [name] is a 73 y.o.female seen today as a new consult, referred by Dr Antonetta for the following medical problems.  1.Heart murmur - noted by rheumatologist - no SOb/DOE, no chest pains - exercises 3 days a week, tolerates well without exertional symptoms. Walks every morning 1.5 hours without symptoms.  - no recent edema.     2.HTN - compliant with meds  3. SLE - followed by rheumatology    Past Medical History:  Diagnosis Date   Arthritis 02/13/2010   abnormal MRI lumbar and c spine  with disc disease   History of anemia    History of diverticulosis    History of prediabetes    Osteoporosis    SLE (systemic lupus erythematosus) (HCC) dx apprx 1990   reports immunotherapy, cytoxan and steroids at [facility], no f/u for over 11yrs     Allergies  Allergen Reactions   Sulfa Antibiotics Rash    Felt bad   Sulfonamide Derivatives Rash    Felt bad     Current Outpatient Medications  Medication Sig Dispense Refill   alendronate  (FOSAMAX ) 70 MG tablet TAKE 1 TABLET BY MOUTH WITH A FULL GLASSOF WATER  ON AN EMPTY STOMACH ONCE A WEEK 12 tablet 1   amLODipine  (NORVASC ) 2.5 MG tablet TAKE ONE TABLET (2.5MG  TOTAL) BY MOUTH DAILY 90 tablet 3   azaTHIOprine  (IMURAN ) 50 MG tablet TAKE ONE (1) TABLET BY MOUTH EVERY DAY 90 tablet 0   Calcium Carb-Cholecalciferol (CALCIUM 600+D) 600-800 MG-UNIT TABS Take 2 tablets by mouth daily.     cholecalciferol (VITAMIN D3) 25 MCG (1000 UNIT) tablet Take 2,000 Units by mouth daily.     Cyanocobalamin (B-12 PO) Take 1 tablet by mouth daily.     ELDERBERRY PO Take by mouth.     hydroxychloroquine  (PLAQUENIL ) 200 MG tablet TAKE ONE TABLET BY MOUTH TWICE A DAY MONDAY THROUGH FRIDAY. 120 tablet 0   Multiple Vitamins-Minerals (SYSTANE ICAPS AREDS2) CAPS Take 2 capsules by mouth daily.     VITAMIN A PO Take 1 capsule by mouth daily.     vitamin C (ASCORBIC ACID) 500 MG tablet Take 1,000 mg by mouth daily.     No current  facility-administered medications for this visit.     Past Surgical History:  Procedure Laterality Date   ABDOMINAL HYSTERECTOMY  unsure, over 10 yrs   partial, has had abnormal pap   BLADDER REPAIR     COLONOSCOPY N/A 01/05/2014   Procedure: COLONOSCOPY;  Surgeon: Lamar CHRISTELLA Hollingshead, MD;  [facility];  Service: Endoscopy;  Laterality: N/A;  12:15 PM   COLONOSCOPY N/A 07/07/2020   Procedure: COLONOSCOPY;  Surgeon: Hollingshead Lamar CHRISTELLA, MD;  [facility];  Service: Endoscopy;  Laterality: N/A;  ASA II / AM procedure     Allergies  Allergen Reactions   Sulfa Antibiotics Rash    Felt bad   Sulfonamide Derivatives Rash    Felt bad      Family History  Problem Relation Age of Onset   Heart disease Father    Cancer Brother 27       breast   Breast cancer Brother    Cancer Sister 46       breast   Breast cancer Sister    Colon cancer Neg Hx      Social History [name] reports that she has never smoked. She has never been exposed to tobacco smoke. She has never used smokeless tobacco. Ms.  Wilson reports no history of alcohol use.   Review of Systems CONSTITUTIONAL: No weight loss, fever, chills, weakness or fatigue.  HEENT: Eyes: No visual loss, blurred vision, double vision or yellow sclerae.No hearing loss, sneezing, congestion, runny nose or sore throat.  SKIN: No rash or itching.  CARDIOVASCULAR: per hpi RESPIRATORY: No shortness of breath, cough or sputum.  GASTROINTESTINAL: No anorexia, nausea, vomiting or diarrhea. No abdominal pain or blood.  GENITOURINARY: No burning on urination, no polyuria NEUROLOGICAL: No headache, dizziness, syncope, paralysis, ataxia, numbness or tingling in the extremities. No change in bowel or bladder control.  MUSCULOSKELETAL: No muscle, back pain, joint pain or stiffness.  LYMPHATICS: No enlarged nodes. No history of splenectomy.  PSYCHIATRIC: No history of depression or anxiety.  ENDOCRINOLOGIC: No reports of  sweating, cold or heat intolerance. No polyuria or polydipsia.  [name]   Physical Examination Today's Vitals   05/02/23 1033  BP: 118/74  Pulse: (!) 55  SpO2: 100%  Weight: 135 lb 6.4 oz (61.4 kg)  Height: 5' 3 (1.6 m)   Body mass index is 23.99 kg/m.  Gen: resting comfortably, no acute distress HEENT: no scleral icterus, pupils equal round and reactive, no palptable cervical adenopathy,  CV: RRR, 2/6 systolic murmur rusb, no jvd Resp: Clear to auscultation bilaterally GI: abdomen is soft, non-tender, non-distended, normal bowel sounds, no hepatosplenomegaly MSK: extremities are warm, no edema.  Skin: warm, no rash Neuro:  no focal deficits Psych: appropriate affect     Assessment and Plan  1.Heart murmur - systolic murmur noted on exam over aortic valve area - she denies any significant symptoms, very active for her age - plan for echo to further evaluate  2. HTN - at goal, continue current meds   EKG today shows NSR      Dorn PHEBE Ross, M.D.

## 2023-05-15 ENCOUNTER — Ambulatory Visit (HOSPITAL_COMMUNITY)
Admission: RE | Admit: 2023-05-15 | Discharge: 2023-05-15 | Disposition: A | Discharge status: Home / Self Care | Payer: Medicare PPO | Source: Ambulatory Visit | Attending: Cardiology | Admitting: Cardiology

## 2023-05-15 DIAGNOSIS — R011 Cardiac murmur, unspecified: Secondary | ICD-10-CM | POA: Insufficient documentation

## 2023-05-15 LAB — ECHOCARDIOGRAM COMPLETE
AR max vel: 2.57 cm2
AV Area VTI: 2.38 cm2
AV Area mean vel: 2.22 cm2
AV Mean grad: 3 mm[Hg]
AV Peak grad: 5.3 mm[Hg]
Ao pk vel: 1.15 m/s
Area-P 1/2: 2.43 cm2
Calc EF: 51.9 %
MV VTI: 2.58 cm2
S' Lateral: 3.2 cm
Single Plane A2C EF: 49.8 %
Single Plane A4C EF: 56.3 %

## 2023-05-15 NOTE — Progress Notes (Unsigned)
 Office Visit Note  Patient: [name]             Date of Birth: 21-Nov-1950           MRN: [id number]             PCP: Kerri Perches, MD Referring: Kerri Perches, MD Visit Date: 05/29/2023 Occupation: @GUAROCC @  Subjective:  Medication monitoring   History of Present Illness: [name] is a 73 y.o. female with history of systemic lupus.  Patient remains on  Imuran 50 mg 1 tablet daily and plaquenil 200 mg 1 tablet by mouth twice daily M-F.  She is tolerating combination therapy without any side effects and has not had any recent gaps in therapy.  She denies any signs or symptoms of a systemic lupus flare.  She denies any increased joint pain or joint swelling.  She does not have any recent rashes or Raynaud's phenomenon.  She denies any oral nasal ulcerations.  She continues to have chronic sicca symptoms and has been using Systane eyedrops and lozenges for symptomatic relief.  Her energy level has been stable.  She has been exercising on a daily basis.    Activities of Daily Living:  Patient reports morning stiffness for 0 minute.   Patient Reports nocturnal pain.  Difficulty dressing/grooming: Denies Difficulty climbing stairs: Denies Difficulty getting out of chair: Denies Difficulty using hands for taps, buttons, cutlery, and/or writing: Denies  Review of Systems  Constitutional:  Negative for fatigue.  HENT:  Positive for mouth dryness. Negative for mouth sores.   Eyes:  Positive for dryness.  Respiratory:  Negative for shortness of breath.   Cardiovascular:  Negative for chest pain and palpitations.  Gastrointestinal:  Negative for blood in stool, constipation and diarrhea.  Endocrine: Negative for increased urination.  Genitourinary:  Negative for involuntary urination.  Musculoskeletal:  Positive for muscle tenderness. Negative for joint pain, gait problem, joint pain, joint swelling, myalgias, muscle weakness, morning stiffness and myalgias.   Skin:  Positive for hair loss and sensitivity to sunlight. Negative for color change and rash.  Allergic/Immunologic: Negative for susceptible to infections.  Neurological:  Negative for dizziness and headaches.  Hematological:  Negative for swollen glands.  Psychiatric/Behavioral:  Negative for depressed mood and sleep disturbance. The patient is not nervous/anxious.     PMFS History:  Patient Active Problem List   Diagnosis Date Noted  . Encounter for immunization 03/19/2023  . Post-menopause 03/19/2023  . Lupus glomerulonephritis (HCC) 03/19/2023  . Encounter for Medicare annual examination with abnormal findings 11/14/2022  . Murmur, cardiac 11/14/2022  . Cervical spondylosis 06/12/2021  . Chronic low back pain with left-sided sciatica 04/11/2021  . Baker's cyst of knee, left 11/26/2020  . Seborrheic dermatitis of scalp 07/19/2020  . Essential hypertension 07/19/2020  . Overweight (BMI 25.0-29.9) 07/19/2020  . Systemic lupus erythematosus arthritis (HCC) 02/12/2020  . Allergic sinusitis 11/04/2018  . Hyperlipidemia 03/08/2017  . Proteinuria 03/08/2017  . SLE glomerulonephritis syndrome (HCC) 09/15/2016  . High risk medication use 09/15/2016  . Degeneration of lumbar intervertebral disc 09/15/2016  . Personal history of other endocrine, nutritional and metabolic disease 63/87/5643  . History of anemia 09/15/2016  . History of proteinuria  09/15/2016  . Tubular adenoma of colon 04/30/2016  . Prediabetes 11/15/2014  . Cervicalgia 05/11/2014  . Osteopenia 07/21/2013  . Anemia 12/22/2012  . Systemic lupus erythematosus (HCC) 02/15/2012  . H/O: gastrointestinal disease 10/01/2007    Past Medical History:  Diagnosis Date  .  Arthritis 02/13/2010   abnormal MRI lumbar and c spine  with disc disease  . Heart murmur   . History of anemia   . History of diverticulosis   . History of prediabetes   . Osteoporosis   . SLE (systemic lupus erythematosus) (HCC) dx apprx 1990    reports immunotherapy, cytoxan and steroids at [facility], no f/u for over 85yrs    Family History  Problem Relation Age of Onset  . Heart disease Father   . Cancer Brother 70       breast  . Breast cancer Brother   . Cancer Sister 22       breast  . Breast cancer Sister   . Colon cancer Neg Hx    Past Surgical History:  Procedure Laterality Date  . ABDOMINAL HYSTERECTOMY  unsure, over 10 yrs   partial, has had abnormal pap  . BLADDER REPAIR    . COLONOSCOPY N/A 01/05/2014   Procedure: COLONOSCOPY;  Surgeon: Corbin Ade, MD;  Location: [facility];  Service: Endoscopy;  Laterality: N/A;  12:15 PM  . 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   1 grandchild   Immunization History  Administered Date(s) Administered  . Fluad Quad(high Dose 65+) 11/18/2018, 01/05/2020, 11/26/2020  . Fluad Trivalent(High Dose 65+) 03/06/2023  . Influenza Split 02/14/2012  . Influenza Whole 12/28/2000  . Influenza,inj,Quad PF,6+ Mos 12/16/2012, 01/06/2014, 01/19/2016, 12/10/2017  . Influenza-Unspecified 12/27/2021  . Moderna SARS-COV2 Booster Vaccination 04/20/2021  . Moderna Sars-Covid-2 Vaccination 05/02/2019, 05/31/2019, 01/29/2020, 10/04/2020  . Pneumococcal Conjugate-13 09/25/2017  . Pneumococcal Polysaccharide-23 09/30/2018  . Td 09/17/2003  . Tdap 01/06/2014  . Zoster, Live 12/17/2012     Objective: Vital Signs: BP (!) 148/77 (BP Location: Left Arm, Patient Position: Sitting, Cuff Size: Normal)   Pulse (!) 55   Resp 14   Ht 5\' 3"  (1.6 m)   Wt 135 lb (61.2 kg)   BMI 23.91 kg/m    Physical Exam Vitals and nursing note reviewed.  Constitutional:      Appearance: She is well-developed.  HENT:     Head: Normocephalic and atraumatic.  Eyes:     Conjunctiva/sclera:  Conjunctivae normal.  Cardiovascular:     Rate and Rhythm: Normal rate and regular rhythm.     Heart sounds: Murmur heard.  Pulmonary:     Effort: Pulmonary effort is normal.     Breath sounds: Normal breath sounds.  Abdominal:     General: Bowel sounds are normal.     Palpations: Abdomen is soft.  Musculoskeletal:     Cervical back: Normal range of motion.  Lymphadenopathy:     Cervical: No cervical adenopathy.  Skin:    General: Skin is warm and dry.     Capillary Refill: Capillary refill takes less than 2 seconds.  Neurological:     Mental Status: She is alert and oriented to person, place, and time.  Psychiatric:        Behavior: Behavior normal.     Musculoskeletal Exam: C-spine has limited range without rotation.  Thoracic and lumbar spine good range of motion.  Shoulder joints, elbow joints, wrist joints, MCPs, PIPs, DIPs have good range of motion with no synovitis.  Complete fist formation bilaterally.  Small mucinous cyst noted left thumb  PIP joint.  Hip joints have good ROM with no groin pain.  Knee joints have good ROM with no warmth or effusion.  Ankle joints have good ROM with no tenderness or joint swelling.   CDAI Exam: CDAI Score: -- Patient Global: --; Provider Global: -- Swollen: --; Tender: -- Joint Exam 05/29/2023   No joint exam has been documented for this visit   There is currently no information documented on the homunculus. Go to the Rheumatology activity and complete the homunculus joint exam.  Investigation: No additional findings.  Imaging: ECHOCARDIOGRAM COMPLETE Result Date: 05/15/2023    ECHOCARDIOGRAM REPORT   Patient Name:   KARMON ANDIS Date of Exam: 05/15/2023 Medical Rec #:  [id number]       Height:       63.0 in Accession #:    [id number]      Weight:       135.4 lb Date of Birth:  20-Apr-1950       BSA:          1.638 m Patient Age:    72 years        BP:           118/74 mmHg Patient Gender: F               HR:           60 bpm. Exam  [facility] Procedure: 2D Echo, Cardiac Doppler and Color Doppler (Both Spectral and Color            Flow Doppler were utilized during procedure). Indications:    Murmur  History:        Patient has no prior history of Echocardiogram examinations.                 Risk Factors:Hypertension.  Sonographer:    Amy Chionchio Referring Phys: [id number] Dorothe Pea BRANCH IMPRESSIONS  1. Left ventricular ejection fraction, by estimation, is 50 to 55%. The left ventricle has low normal function. The left ventricle has no regional wall motion abnormalities. Left ventricular diastolic parameters are consistent with Grade I diastolic dysfunction (impaired relaxation).  2. Right ventricular systolic function is normal. The right ventricular size is normal.  3. The mitral valve is normal in structure. Trivial mitral valve regurgitation. No evidence of mitral stenosis.  4. The aortic valve is tricuspid. Aortic valve regurgitation is not visualized. No aortic stenosis is present.  5. The inferior vena cava is normal in size with greater than 50% respiratory variability, suggesting right atrial pressure of 3 mmHg. FINDINGS  Left Ventricle: Left ventricular ejection fraction, by estimation, is 50 to 55%. The left ventricle has low normal function. The left ventricle has no regional wall motion abnormalities. Strain imaging was not performed. The left ventricular internal cavity size was normal in size. There is no left ventricular hypertrophy. Left ventricular diastolic parameters are consistent with Grade I diastolic dysfunction (impaired relaxation). Normal left ventricular filling pressure. Right Ventricle: The right ventricular size is normal. Right vetricular wall thickness was not well visualized. Right ventricular systolic function is normal. Left Atrium: Left atrial size was normal in size. Right Atrium: Right atrial size was normal in size. Pericardium: There is no evidence of pericardial effusion. Mitral Valve: The  mitral valve is normal in structure. Trivial mitral valve regurgitation. No evidence of mitral valve stenosis. MV peak gradient, 3.5 mmHg. The mean mitral valve gradient is 1.0 mmHg. Tricuspid Valve: The tricuspid valve is normal in structure.  Tricuspid valve regurgitation is not demonstrated. No evidence of tricuspid stenosis. Aortic Valve: The aortic valve is tricuspid. Aortic valve regurgitation is not visualized. No aortic stenosis is present. Aortic valve mean gradient measures 3.0 mmHg. Aortic valve peak gradient measures 5.3 mmHg. Aortic valve area, by VTI measures 2.38 cm. Pulmonic Valve: The pulmonic valve was not well visualized. Pulmonic valve regurgitation is mild. No evidence of pulmonic stenosis. Aorta: The aortic root and ascending aorta are structurally normal, with no evidence of dilitation. Venous: The inferior vena cava is normal in size with greater than 50% respiratory variability, suggesting right atrial pressure of 3 mmHg. IAS/Shunts: No atrial level shunt detected by color flow Doppler. Additional Comments: 3D imaging was not performed.  LEFT VENTRICLE PLAX 2D LVIDd:         4.70 cm     Diastology LVIDs:         3.20 cm     LV e' medial:    5.77 cm/s LV PW:         0.80 cm     LV E/e' medial:  11.1 LV IVS:        0.70 cm     LV e' lateral:   9.03 cm/s LVOT diam:     2.00 cm     LV E/e' lateral: 7.1 LV SV:         58 LV SV Index:   35 LVOT Area:     3.14 cm  LV Volumes (MOD) LV vol d, MOD A2C: 75.9 ml LV vol d, MOD A4C: 93.2 ml LV vol s, MOD A2C: 38.1 ml LV vol s, MOD A4C: 40.7 ml LV SV MOD A2C:     37.8 ml LV SV MOD A4C:     93.2 ml LV SV MOD BP:      43.8 ml RIGHT VENTRICLE             IVC RV Basal diam:  3.00 cm     IVC diam: 1.40 cm RV S prime:     10.30 cm/s TAPSE (M-mode): 1.6 cm LEFT ATRIUM           Index        RIGHT ATRIUM           Index LA Vol (A2C): 41.5 ml 25.33 ml/m  RA Area:     12.80 cm LA Vol (A4C): 27.0 ml 16.48 ml/m  RA Volume:   29.30 ml  17.89 ml/m  AORTIC VALVE                     PULMONIC VALVE AV Area (Vmax):    2.57 cm     PV Vmax:          1.40 m/s AV Area (Vmean):   2.22 cm     PV Peak grad:     7.8 mmHg AV Area (VTI):     2.38 cm     PR End Diast Vel: 3.82 msec AV Vmax:           115.00 cm/s AV Vmean:          83.500 cm/s AV VTI:            0.244 m AV Peak Grad:      5.3 mmHg AV Mean Grad:      3.0 mmHg LVOT Vmax:         94.00 cm/s LVOT Vmean:        59.000 cm/s LVOT VTI:  0.185 m LVOT/AV VTI ratio: 0.76  AORTA Ao Root diam: 2.50 cm Ao Asc diam:  2.90 cm MITRAL VALVE               TRICUSPID VALVE MV Area (PHT): 2.43 cm    TR Peak grad:   14.9 mmHg MV Area VTI:   2.58 cm    TR Vmax:        193.00 cm/s MV Peak grad:  3.5 mmHg MV Mean grad:  1.0 mmHg    SHUNTS MV Vmax:       0.93 m/s    Systemic VTI:  0.18 m MV Vmean:      50.1 cm/s   Systemic Diam: 2.00 cm MV Decel Time: 312 msec MV E velocity: 63.80 cm/s MV A velocity: 91.30 cm/s MV E/A ratio:  0.70 Dina Rich MD Electronically signed by Dina Rich MD Signature Date/Time: 05/15/2023/3:30:37 PM    Final     Recent Labs: Lab Results  Component Value Date   WBC 2.9 (L) 04/11/2023   HGB 12.9 04/11/2023   PLT 156 04/11/2023   NA 141 04/11/2023   K 5.0 04/11/2023   CL 103 04/11/2023   CO2 24 04/11/2023   GLUCOSE 86 04/11/2023   BUN 19 04/11/2023   CREATININE 1.12 (H) 04/11/2023   BILITOT 0.4 04/11/2023   ALKPHOS 41 (L) 04/11/2023   AST 26 04/11/2023   ALT 18 04/11/2023   PROT 7.1 04/11/2023   ALBUMIN 4.2 04/11/2023   CALCIUM 10.2 04/11/2023   GFRAA 47 (L) 11/28/2019   QFTBGOLDPLUS NEGATIVE 08/03/2021    Speciality Comments: Plaquenil eye exam normal on 05/26/2022 [facility] f/u 6 months  Procedures:  No procedures performed Allergies: Sulfa antibiotics and Sulfonamide derivatives         Assessment / Plan:     Visit Diagnoses: Other systemic lupus erythematosus with other organ involvement (HCC) - History of dsDNA +64, Smith + 5.5, RNP+ 6.5, SSA(Ro) Negative, SSB  (La) Negative, presented initially with nephritis, hair loss, arthralgias, dermatitis: She has not had any signs or symptoms of a systemic lupus flare.  She has clinically been doing well taking Imuran 50 mg 1 tablet by mouth daily and Plaquenil 200 mg 1 tablet by mouth twice daily Monday through Friday.  She is tolerating combination therapy without any side effects and has not had any recent gaps in therapy.  She has not had any oral or nasal ulcerations, sicca symptoms, increased fatigue, Raynaud's phenomenon, recent rashes.  She has been exercising on a daily basis.  She has no synovitis on examination. Lab work from 04/11/23 was reviewed today in the office: ANA negative, dsDNA 3, RNP 5.0, SM-, Scl-70-, Ro-, La-, complements WNL, and ESR 8.  Urine protein creatinine ratio WNL.  Labs are not consistent with a flare.  White blood cell count was 2.9-rest of CBC within normal limits on 04/11/2023--she will be having updated lab work this week ordered by nephrology.  She will continue to require updated lab work every 3 months. At the patient's last office visit on 02/27/2023 a murmur was auscultated.  She was evaluated by Dr. Wyline Mood on 05/02/2023 at which time a systolic murmur was confirmed.  She underwent an echocardiogram on 05/15/2023-Grade I diastolic dysfunction (impaired relaxation) . She will remain on Imuran and Plaquenil as prescribed.  She was advised to notify us if she develops signs or symptoms of a flare.  She will follow-up in the office in 5 months or sooner if  needed.  High risk medication use - Imuran 50 mg 1 tablet daily and plaquenil 200 mg 1 tablet by mouth twice daily M-F. CBC and CMP updated on 04/11/23--CBC with diff due-she will be having updated lab work this week. Recommended continuing to have updated CBC and CMP every 3 months. Plaquenil eye exam normal on 05/26/2022 [facility].  Patient was encouraged to schedule an updated Plaquenil examination.  She was given a new eye examination  form to take with her to her next appointment.  Lupus nephritis (HCC) - Treated with Cytoxan IV and prednisone for 1 year.  Followed closely by Dr. Wolfgang Phoenix every 3 months--upcoming appointment scheduled this week Urine protein creatinine ratio was within normal limits on 04/11/2023.  Chronic left shoulder pain: Good range of motion with no discomfort.  Chronic pain of left knee: Good range of motion with no discomfort.  Degeneration of intervertebral disc of lumbar region without discogenic back pain or lower extremity pain: No symptoms of radiculopathy.  Age-related osteoporosis without current pathological fracture -Previous DEXA 01/19/2020: The BMD measured at Forearm Radius 33% is 0.549 g/cm2 with a T-score of -2.3.   DEXA updated on 04/27/23: The BMD measured at Forearm Radius 33% is 0.541 g/cm2 with a T-score of -2.3.  She is taking fosamax 70 mg 1 tablet by mouth once weekly and a calcium and vitamin D supplement daily for management of osteoporosis.   Murmur: Evaluated by Dr. Wyline Mood on 05/02/2023--2/6 systolic murmur noted.   Echocardiogram 05/15/23: Left ventricular ejection fraction, by estimation, is 50 to 55%.   Other medical conditions are listed as follows:   History of hyperlipidemia  History of proteinuria syndrome  History of prediabetes  History of diverticulosis  History of anemia  History of renal insufficiency  History of adenomatous polyp of colon    Orders: No orders of the defined types were placed in this encounter.  No orders of the defined types were placed in this encounter.     Follow-Up Instructions: Return in about 5 months (around 10/29/2023) for Systemic lupus erythematosus.   Gearldine Bienenstock, PA-C  Note - This record has been created using Dragon software.  Chart creation errors have been sought, but may not always  have been located. Such creation errors do not reflect on  the standard of medical care.

## 2023-05-29 ENCOUNTER — Encounter: Payer: Self-pay | Admitting: Physician Assistant

## 2023-05-29 ENCOUNTER — Ambulatory Visit: Payer: Medicare PPO | Attending: Physician Assistant | Admitting: Physician Assistant

## 2023-05-29 VITALS — BP 148/77 | HR 55 | Resp 14 | Ht 63.0 in | Wt 135.0 lb

## 2023-05-29 DIAGNOSIS — M3219 Other organ or system involvement in systemic lupus erythematosus: Secondary | ICD-10-CM

## 2023-05-29 DIAGNOSIS — M3214 Glomerular disease in systemic lupus erythematosus: Secondary | ICD-10-CM

## 2023-05-29 DIAGNOSIS — M25562 Pain in left knee: Secondary | ICD-10-CM

## 2023-05-29 DIAGNOSIS — Z79899 Other long term (current) drug therapy: Secondary | ICD-10-CM

## 2023-05-29 DIAGNOSIS — Z8719 Personal history of other diseases of the digestive system: Secondary | ICD-10-CM

## 2023-05-29 DIAGNOSIS — Z87898 Personal history of other specified conditions: Secondary | ICD-10-CM

## 2023-05-29 DIAGNOSIS — M810 Age-related osteoporosis without current pathological fracture: Secondary | ICD-10-CM | POA: Diagnosis not present

## 2023-05-29 DIAGNOSIS — M51369 Other intervertebral disc degeneration, lumbar region without mention of lumbar back pain or lower extremity pain: Secondary | ICD-10-CM | POA: Diagnosis not present

## 2023-05-29 DIAGNOSIS — Z87448 Personal history of other diseases of urinary system: Secondary | ICD-10-CM | POA: Diagnosis not present

## 2023-05-29 DIAGNOSIS — Z8639 Personal history of other endocrine, nutritional and metabolic disease: Secondary | ICD-10-CM

## 2023-05-29 DIAGNOSIS — Z862 Personal history of diseases of the blood and blood-forming organs and certain disorders involving the immune mechanism: Secondary | ICD-10-CM

## 2023-05-29 DIAGNOSIS — Z860101 Personal history of adenomatous and serrated colon polyps: Secondary | ICD-10-CM

## 2023-05-29 DIAGNOSIS — M25512 Pain in left shoulder: Secondary | ICD-10-CM

## 2023-05-29 DIAGNOSIS — R011 Cardiac murmur, unspecified: Secondary | ICD-10-CM

## 2023-05-29 DIAGNOSIS — G8929 Other chronic pain: Secondary | ICD-10-CM

## 2023-05-29 NOTE — Patient Instructions (Signed)
 Standing Labs We placed an order today for your standing lab work.   Please have your standing labs drawn in April and every 3 months   Please have your labs drawn 2 weeks prior to your appointment so that the provider can discuss your lab results at your appointment, if possible.  Please note that you may see your imaging and lab results in MyChart before we have reviewed them. We will contact you once all results are reviewed. Please allow our office up to 72 hours to thoroughly review all of the results before contacting the office for clarification of your results.  WALK-IN LAB HOURS  Monday through Thursday from 8:00 am -12:30 pm and 1:00 pm-5:00 pm and Friday from 8:00 am-12:00 pm.  Patients with office visits requiring labs will be seen before walk-in labs.  You may encounter longer than normal wait times. Please allow additional time. Wait times may be shorter on  Monday and Thursday afternoons.  We do not book appointments for walk-in labs. We appreciate your patience and understanding with our staff.   Labs are drawn by [facility]. Please bring your co-pay at the time of your lab draw.  You may receive a bill from [facility] for your lab work.  Please note if you are on Hydroxychloroquine and and an order has been placed for a Hydroxychloroquine level,  you will need to have it drawn 4 hours or more after your last dose.  If you wish to have your labs drawn at another location, please call the office 24 hours in advance so we can fax the orders.  The office is located at [address], [place], [place] [address]   If you have any questions regarding directions or hours of operation,  please call [phone].   As a reminder, please drink plenty of water prior to coming for your lab work. Thanks!

## 2023-05-31 DIAGNOSIS — I129 Hypertensive chronic kidney disease with stage 1 through stage 4 chronic kidney disease, or unspecified chronic kidney disease: Secondary | ICD-10-CM | POA: Diagnosis not present

## 2023-05-31 DIAGNOSIS — M329 Systemic lupus erythematosus, unspecified: Secondary | ICD-10-CM | POA: Diagnosis not present

## 2023-05-31 DIAGNOSIS — I5032 Chronic diastolic (congestive) heart failure: Secondary | ICD-10-CM | POA: Diagnosis not present

## 2023-05-31 DIAGNOSIS — N1831 Chronic kidney disease, stage 3a: Secondary | ICD-10-CM | POA: Diagnosis not present

## 2023-06-16 NOTE — Progress Notes (Signed)
 Pt screened for HTN, BP found to be WNL.

## 2023-07-20 NOTE — Progress Notes (Signed)
 Pt attended 06/15/23 screening event with BP of 110/75. Pt noted at event that she does have a PCP. At event pt did not indicate any SDOH needs. Pt also noted that she is not a smoker and listed Medicare as her insurance at the event.   Per chart review pt does have a PCP [name]; [facility]), insurance, and is not a smoker. Pt's last appt with PCP was on 03/06/2023 and has an upcoming appt on 08/07/2023. Pt does not indicate any SDOH needs at this time.  No additional pt f/u to be scheduled at this time per health equity protocol.

## 2023-07-30 ENCOUNTER — Other Ambulatory Visit: Payer: Self-pay | Admitting: Physician Assistant

## 2023-07-30 DIAGNOSIS — M3219 Other organ or system involvement in systemic lupus erythematosus: Secondary | ICD-10-CM

## 2023-07-30 NOTE — Telephone Encounter (Signed)
 Last Fill: 04/23/2023  Eye exam: 05/26/2022 normal   Labs: 04/11/2023 Creatinine remains elevated-1.12 and GFR is low-53. Avoid the use of NSAIDs and forward results to her nephrologist.   Alk phos is borderline low-we will continue to monitor for now.   WBC count is low-2.9.  rest of CBC Wnl.  Any change in medications? Recent infection? Recommend rechecking CBC with diff in 1 month. Urine protein creatinine ratio WNL   Next Visit: 10/29/2023  Last Visit: 05/29/2023  ON:GEXBM systemic lupus erythematosus with other organ involvement (HCC)   Current Dose per office note 05/29/2023: plaquenil  200 mg 1 tablet by mouth twice daily M-F   Contacted the patient regarding her PLQ eye exam. Patient states she has her exam scheduled for this week, 08/02/2023.   Okay to refill Plaquenil ?

## 2023-08-02 DIAGNOSIS — H2513 Age-related nuclear cataract, bilateral: Secondary | ICD-10-CM | POA: Diagnosis not present

## 2023-08-02 DIAGNOSIS — H11153 Pinguecula, bilateral: Secondary | ICD-10-CM | POA: Diagnosis not present

## 2023-08-02 DIAGNOSIS — Z79899 Other long term (current) drug therapy: Secondary | ICD-10-CM | POA: Diagnosis not present

## 2023-08-02 DIAGNOSIS — L930 Discoid lupus erythematosus: Secondary | ICD-10-CM | POA: Diagnosis not present

## 2023-08-02 DIAGNOSIS — H18413 Arcus senilis, bilateral: Secondary | ICD-10-CM | POA: Diagnosis not present

## 2023-08-02 DIAGNOSIS — H11823 Conjunctivochalasis, bilateral: Secondary | ICD-10-CM | POA: Diagnosis not present

## 2023-08-06 ENCOUNTER — Other Ambulatory Visit: Payer: Self-pay | Admitting: Physician Assistant

## 2023-08-06 DIAGNOSIS — M3219 Other organ or system involvement in systemic lupus erythematosus: Secondary | ICD-10-CM

## 2023-08-06 NOTE — Telephone Encounter (Signed)
 Last Fill: 04/04/2023  Labs: 04/11/2023 Creatinine remains elevated-1.12 and GFR is low-53. Alk phos is borderline low WBC count is low-2.9.  rest of CBC Wnl   Next Visit: 10/29/2023  Last Visit: 05/29/2023  DX: Other systemic lupus erythematosus with other organ involvement   Current Dose per office note 05/29/2023: Imuran  50 mg 1 tablet daily   Okay to refill Imuran ?

## 2023-08-07 ENCOUNTER — Telehealth: Payer: Medicare PPO | Admitting: Family Medicine

## 2023-08-21 ENCOUNTER — Encounter: Payer: Self-pay | Admitting: Family Medicine

## 2023-08-21 ENCOUNTER — Ambulatory Visit: Payer: Self-pay | Admitting: Family Medicine

## 2023-08-21 VITALS — BP 144/80 | HR 63 | Resp 16 | Ht 63.0 in | Wt 132.0 lb

## 2023-08-21 DIAGNOSIS — R350 Frequency of micturition: Secondary | ICD-10-CM | POA: Insufficient documentation

## 2023-08-21 DIAGNOSIS — G8929 Other chronic pain: Secondary | ICD-10-CM | POA: Diagnosis not present

## 2023-08-21 DIAGNOSIS — E785 Hyperlipidemia, unspecified: Secondary | ICD-10-CM | POA: Diagnosis not present

## 2023-08-21 DIAGNOSIS — I10 Essential (primary) hypertension: Secondary | ICD-10-CM | POA: Diagnosis not present

## 2023-08-21 DIAGNOSIS — M5442 Lumbago with sciatica, left side: Secondary | ICD-10-CM | POA: Diagnosis not present

## 2023-08-21 DIAGNOSIS — R7989 Other specified abnormal findings of blood chemistry: Secondary | ICD-10-CM | POA: Diagnosis not present

## 2023-08-21 DIAGNOSIS — R7303 Prediabetes: Secondary | ICD-10-CM | POA: Diagnosis not present

## 2023-08-21 MED ORDER — UNABLE TO FIND: 0 refills | Status: AC

## 2023-08-21 NOTE — Assessment & Plan Note (Signed)
 Updated lab needed at/ before next visit.

## 2023-08-21 NOTE — Patient Instructions (Addendum)
 Annual exam in 3  months, call if you need me sooner  Blood pressure is elevated at visit, will follow up on this if remains above 140 you will need higher dose of amlodipine   Nurse BP check in  5 weeks  Nurse pls order BP cuff for pt  Urine for analysis and c/s if abn today, nurse pls order  Colonoscopy due this year will refer  Fasting  lipid, cmp and eGFr, hBA1C tSH and Vit D last week of July  Need to address the sciatic pain as worsening, you will speak with Rheumatology as we discussedf and if you need help from me let me know

## 2023-08-21 NOTE — Assessment & Plan Note (Addendum)
 3 week history check uA and reflex c/s

## 2023-08-21 NOTE — Progress Notes (Signed)
 [name]     MRN: [id number]      DOB: 05-08-1950  Chief Complaint  Patient presents with   Hypertension    Follow up   Urinary Incontinence    Has been having urinary incontinence for last month     HPI [name] is here for follow up and re-evaluation of chronic medical conditions, medication management and review of any available recent lab and radiology data.  Preventive health is updated, specifically  Cancer screening and Immunization.   Questions or concerns regarding consultations or procedures which the PT has had in the interim are  addressed. The PT denies any adverse reactions to current medications since the last visit.  C/O URINARY FREQUENCY X 3 WEEKS, DENIES INCONTINENCE, STATES SHE IS DRINKIG MORE WATER  IMNTENTIONALLY AND MAYBE TOO LATE AT NIGHT AS SHE GETS UP AT [facility] 3 TIMES PER NIGHT TO PEE C/O INCREASED LLE PAIN DISTURBS HER SLEEP, HAS STARTED TALKING WITH RHEUMATOLOGY ABOUT GETTING SOME HELP FOR THIS AND WILL LIKE TO F/U WITH THEM. ROS Denies recent fever or chills. Denies sinus pressure, nasal congestion, ear pain or sore throat. Denies chest congestion, productive cough or wheezing. Denies chest pains, palpitations and leg swelling Denies abdominal pain, nausea, vomiting,diarrhea or constipation.   Denies headaches, seizures, numbness, or tingling. Denies depression, anxiety or insomnia. Denies skin break down or rash.   PE  BP (!) 144/80   Pulse 63   Resp 16   Ht 5\' 3"  (1.6 m)   Wt 132 lb (59.9 kg)   SpO2 98%   BMI 23.38 kg/m   Patient alert and oriented and in no cardiopulmonary distress.  HEENT: No facial asymmetry, EOMI,     Neck supple .  Chest: Clear to auscultation bilaterally.  CVS: S1, S2 no murmurs, no S3.Regular rate.  ABD: Soft non tender.   Ext: No edema  MS: Decreased  ROM lumbar spine, adequate in shoulders, hips and knees.  Skin: Intact, no ulcerations or rash noted.  Psych: Good eye contact, normal affect. Memory  intact not anxious or depressed appearing.  CNS: CN 2-12 intact, power,  normal throughout.no focal deficits noted.   Assessment & Plan  Essential hypertension Unc ontrolled DASH diet and commitment to daily physical activity for a minimum of 30 minutes discussed and encouraged, as a part of hypertension management. The importance of attaining a healthy weight is also discussed.     08/21/2023    8:41 AM 08/21/2023    8:40 AM 08/21/2023    8:07 AM 06/16/2023   10:45 AM 05/29/2023    9:10 AM 05/02/2023   10:33 AM 03/06/2023    3:50 PM  BP/Weight  Systolic BP 144 146 148 110 148 118 120  Diastolic BP 80 80 74 75 77 74 70  Wt. (Lbs)   132  135 135.4   BMI   23.38 kg/m2  23.91 kg/m2 23.99 kg/m2     'review in 5 weeks  Chronic low back pain with left-sided sciatica Worsening with pain over 6 disturbs sleep and popssible weakness, elects to have rheumatology address  Prediabetes Patient educated about the importance of limiting  Carbohydrate intake , the need to commit to daily physical activity for a minimum of 30 minutes , and to commit weight loss. The fact that changes in all these areas will reduce or eliminate all together the development of diabetes is stressed.      Latest Ref Rng & Units 04/11/2023    8:33  AM 04/11/2023    8:29 AM 03/06/2023   11:28 AM 11/23/2022    8:49 AM 11/01/2022   10:12 AM  Diabetic Labs  HbA1c 4.8 - 5.6 %  6.0   6.4    Chol 100 - 199 mg/dL  161   096    HDL >04 mg/dL  45   41    Calc LDL 0 - 99 mg/dL  540   981    Triglycerides 0 - 149 mg/dL  191   478    Creatinine 0.57 - 1.00 mg/dL 2.95   6.21   3.08       08/21/2023    8:41 AM 08/21/2023    8:40 AM 08/21/2023    8:07 AM 06/16/2023   10:45 AM 05/29/2023    9:10 AM 05/02/2023   10:33 AM 03/06/2023    3:50 PM  BP/Weight  Systolic BP 144 146 148 110 148 118 120  Diastolic BP 80 80 74 75 77 74 70  Wt. (Lbs)   132  135 135.4   BMI   23.38 kg/m2  23.91 kg/m2 23.99 kg/m2        No data to display           Updated lab needed at/ before next visit.   Dyslipidemia Hyperlipidemia:Low fat diet discussed and encouraged.   Lipid Panel  Lab Results  Component Value Date   CHOL 189 04/11/2023   HDL 45 04/11/2023   LDLCALC 125 (H) 04/11/2023   TRIG 105 04/11/2023   CHOLHDL 4.2 04/11/2023     Updated lab needed at/ before next visit.   Low vitamin D  level Updated lab needed at/ before next visit.   Urinary frequency 3 week history check uA and reflex c/s

## 2023-08-21 NOTE — Assessment & Plan Note (Signed)
 Hyperlipidemia:Low fat diet discussed and encouraged.   Lipid Panel  Lab Results  Component Value Date   CHOL 189 04/11/2023   HDL 45 04/11/2023   LDLCALC 125 (H) 04/11/2023   TRIG 105 04/11/2023   CHOLHDL 4.2 04/11/2023     Updated lab needed at/ before next visit.

## 2023-08-21 NOTE — Assessment & Plan Note (Signed)
 Worsening with pain over 6 disturbs sleep and popssible weakness, elects to have rheumatology address

## 2023-08-21 NOTE — Assessment & Plan Note (Signed)
 Unc ontrolled DASH diet and commitment to daily physical activity for a minimum of 30 minutes discussed and encouraged, as a part of hypertension management. The importance of attaining a healthy weight is also discussed.     08/21/2023    8:41 AM 08/21/2023    8:40 AM 08/21/2023    8:07 AM 06/16/2023   10:45 AM 05/29/2023    9:10 AM 05/02/2023   10:33 AM 03/06/2023    3:50 PM  BP/Weight  Systolic BP 144 146 148 110 148 118 120  Diastolic BP 80 80 74 75 77 74 70  Wt. (Lbs)   132  135 135.4   BMI   23.38 kg/m2  23.91 kg/m2 23.99 kg/m2     'review in 5 weeks

## 2023-08-21 NOTE — Assessment & Plan Note (Signed)
 Patient educated about the importance of limiting  Carbohydrate intake , the need to commit to daily physical activity for a minimum of 30 minutes , and to commit weight loss. The fact that changes in all these areas will reduce or eliminate all together the development of diabetes is stressed.      Latest Ref Rng & Units 04/11/2023    8:33 AM 04/11/2023    8:29 AM 03/06/2023   11:28 AM 11/23/2022    8:49 AM 11/01/2022   10:12 AM  Diabetic Labs  HbA1c 4.8 - 5.6 %  6.0   6.4    Chol 100 - 199 mg/dL  409   811    HDL >91 mg/dL  45   41    Calc LDL 0 - 99 mg/dL  478   295    Triglycerides 0 - 149 mg/dL  621   308    Creatinine 0.57 - 1.00 mg/dL 6.57   8.46   9.62       08/21/2023    8:41 AM 08/21/2023    8:40 AM 08/21/2023    8:07 AM 06/16/2023   10:45 AM 05/29/2023    9:10 AM 05/02/2023   10:33 AM 03/06/2023    3:50 PM  BP/Weight  Systolic BP 144 146 148 110 148 118 120  Diastolic BP 80 80 74 75 77 74 70  Wt. (Lbs)   132  135 135.4   BMI   23.38 kg/m2  23.91 kg/m2 23.99 kg/m2        No data to display          Updated lab needed at/ before next visit.

## 2023-08-22 DIAGNOSIS — Z79899 Other long term (current) drug therapy: Secondary | ICD-10-CM | POA: Diagnosis not present

## 2023-09-19 DIAGNOSIS — I10 Essential (primary) hypertension: Secondary | ICD-10-CM | POA: Diagnosis not present

## 2023-09-19 DIAGNOSIS — R7303 Prediabetes: Secondary | ICD-10-CM | POA: Diagnosis not present

## 2023-09-19 DIAGNOSIS — R7989 Other specified abnormal findings of blood chemistry: Secondary | ICD-10-CM | POA: Diagnosis not present

## 2023-09-19 DIAGNOSIS — E785 Hyperlipidemia, unspecified: Secondary | ICD-10-CM | POA: Diagnosis not present

## 2023-09-20 LAB — HEMOGLOBIN A1C
Est. average glucose Bld gHb Est-mCnc: 128 mg/dL
Hgb A1c MFr Bld: 6.1 % — ABNORMAL HIGH (ref 4.8–5.6)

## 2023-09-20 LAB — LIPID PANEL W/O CHOL/HDL RATIO
Cholesterol, Total: 175 mg/dL (ref 100–199)
HDL: 38 mg/dL — ABNORMAL LOW (ref >39)
LDL Chol Calc (NIH): 110 mg/dL — ABNORMAL HIGH (ref 0–99)
Triglycerides: 154 mg/dL — ABNORMAL HIGH (ref 0–149)
VLDL Cholesterol Cal: 27 mg/dL (ref 5–40)

## 2023-09-20 LAB — CMP14+EGFR
ALT: 19 IU/L (ref 0–32)
AST: 23 IU/L (ref 0–40)
Albumin: 4.1 g/dL (ref 3.8–4.8)
Alkaline Phosphatase: 42 IU/L — ABNORMAL LOW (ref 44–121)
BUN/Creatinine Ratio: 14 (ref 12–28)
BUN: 16 mg/dL (ref 8–27)
Bilirubin Total: 0.4 mg/dL (ref 0.0–1.2)
CO2: 22 mmol/L (ref 20–29)
Calcium: 9.3 mg/dL (ref 8.7–10.3)
Chloride: 103 mmol/L (ref 96–106)
Creatinine, Ser: 1.15 mg/dL — ABNORMAL HIGH (ref 0.57–1.00)
Globulin, Total: 3.1 g/dL (ref 1.5–4.5)
Glucose: 82 mg/dL (ref 70–99)
Potassium: 4.6 mmol/L (ref 3.5–5.2)
Sodium: 141 mmol/L (ref 134–144)
Total Protein: 7.2 g/dL (ref 6.0–8.5)
eGFR: 51 mL/min/{1.73_m2} — ABNORMAL LOW (ref >59)

## 2023-09-20 LAB — VITAMIN D 25 HYDROXY (VIT D DEFICIENCY, FRACTURES): Vit D, 25-Hydroxy: 37.7 ng/mL (ref 30.0–100.0)

## 2023-09-20 LAB — TSH+FREE T4
Free T4: 1.13 ng/dL (ref 0.82–1.77)
TSH: 1.89 u[IU]/mL (ref 0.450–4.500)

## 2023-09-22 ENCOUNTER — Ambulatory Visit: Payer: Self-pay | Admitting: Family Medicine

## 2023-09-22 ENCOUNTER — Encounter: Payer: Self-pay | Admitting: Family Medicine

## 2023-09-25 ENCOUNTER — Ambulatory Visit

## 2023-09-25 VITALS — BP 130/76

## 2023-09-25 DIAGNOSIS — I10 Essential (primary) hypertension: Secondary | ICD-10-CM

## 2023-09-25 NOTE — Progress Notes (Signed)
 Patient is in office today for a nurse visit for Blood Pressure Check. Patient has no complaints and blood pressure was 130/76. Advised to continue current meds.

## 2023-10-05 DIAGNOSIS — N1831 Chronic kidney disease, stage 3a: Secondary | ICD-10-CM | POA: Diagnosis not present

## 2023-10-05 DIAGNOSIS — R7303 Prediabetes: Secondary | ICD-10-CM | POA: Diagnosis not present

## 2023-10-05 DIAGNOSIS — M329 Systemic lupus erythematosus, unspecified: Secondary | ICD-10-CM | POA: Diagnosis not present

## 2023-10-05 DIAGNOSIS — I129 Hypertensive chronic kidney disease with stage 1 through stage 4 chronic kidney disease, or unspecified chronic kidney disease: Secondary | ICD-10-CM | POA: Diagnosis not present

## 2023-10-17 NOTE — Progress Notes (Unsigned)
 Office Visit Note  Patient: [name]             Date of Birth: [birth year]           MRN: [id number]             PCP: Antonetta Rollene BRAVO, MD Referring: Antonetta Rollene BRAVO, MD Visit Date: 10/31/2023 Occupation: @GUAROCC @  Subjective:  Medication monitoring   History of Present Illness: ANEESA Wilson is a 73 y.o. female with history of systemic lupus and osteoporosis.  Patient remains on Imuran  50 mg 1 tablet daily and plaquenil  200 mg 1 tablet by mouth twice daily M-F.  She is tolerating combination therapy without any side effects and has not had any recent gaps in therapy.  Patient denies any signs or symptoms of a systemic lupus flare.  She denies any recent rashes and has been trying to avoid direct sun exposure.  She denies any oral or nasal ulcerations.  She uses Systane eyedrops for dry eyes.  She has not had any symptoms of Raynaud's phenomenon. She experiences some stiffness in the left knee when rising from a seated position but denies any other joint pain or joint swelling at this time.  Her energy level has been stable.  She denies any new medical conditions.  She denies any recent or recurrent infections.   Activities of Daily Living:  Patient reports morning stiffness for 5-10 minutes.   Patient Reports nocturnal pain.  Difficulty dressing/grooming: Denies Difficulty climbing stairs: Denies Difficulty getting out of chair: Denies Difficulty using hands for taps, buttons, cutlery, and/or writing: Denies  Review of Systems  Constitutional:  Negative for fatigue.  HENT:  Negative for mouth sores and mouth dryness.   Eyes:  Negative for dryness.  Respiratory:  Negative for shortness of breath.   Cardiovascular:  Negative for chest pain and palpitations.  Gastrointestinal:  Positive for constipation. Negative for blood in stool and diarrhea.  Endocrine: Negative for increased urination.  Genitourinary:  Negative for involuntary urination.  Musculoskeletal:   Positive for joint pain, joint pain and morning stiffness. Negative for gait problem, joint swelling, myalgias, muscle weakness, muscle tenderness and myalgias.  Skin:  Positive for sensitivity to sunlight. Negative for color change, rash and hair loss.  Allergic/Immunologic: Negative for susceptible to infections.  Neurological:  Negative for dizziness and headaches.  Hematological:  Negative for swollen glands.  Psychiatric/Behavioral:  Negative for depressed mood and sleep disturbance. The patient is not nervous/anxious.     PMFS History:  Patient Active Problem List   Diagnosis Date Noted   Dyslipidemia 08/21/2023   Low vitamin D  level 08/21/2023   Urinary frequency 08/21/2023   Encounter for immunization 03/19/2023   Post-menopause 03/19/2023   Lupus glomerulonephritis (HCC) 03/19/2023   Encounter for Medicare annual examination with abnormal findings 11/14/2022   Murmur, cardiac 11/14/2022   Cervical spondylosis 06/12/2021   Chronic low back pain with left-sided sciatica 04/11/2021   Baker's cyst of knee, left 11/26/2020   Seborrheic dermatitis of scalp 07/19/2020   Essential hypertension 07/19/2020   Overweight (BMI 25.0-29.9) 07/19/2020   Systemic lupus erythematosus arthritis (HCC) 02/12/2020   Allergic sinusitis 11/04/2018   Hyperlipidemia 03/08/2017   Proteinuria 03/08/2017   SLE glomerulonephritis syndrome (HCC) 09/15/2016   High risk medication use 09/15/2016   Degeneration of lumbar intervertebral disc 09/15/2016   Personal history of other endocrine, nutritional and metabolic disease 93/77/7981   History of anemia 09/15/2016   History of proteinuria  09/15/2016  Tubular adenoma of colon 04/30/2016   Prediabetes 11/15/2014   Cervicalgia 05/11/2014   Osteopenia 07/21/2013   Anemia 12/22/2012   Systemic lupus erythematosus (HCC) 02/15/2012   H/O: gastrointestinal disease 10/01/2007    Past Medical History:  Diagnosis Date   Arthritis 02/13/2010   abnormal  MRI lumbar and c spine  with disc disease   Heart murmur    History of anemia    History of diverticulosis    History of prediabetes    Osteoporosis    SLE (systemic lupus erythematosus) (HCC) dx apprx 1990   reports immunotherapy, cytoxan and steroids at [facility], no f/u for over 9yrs    Family History  Problem Relation Age of Onset   Heart disease Father    Cancer Brother 81       breast   Breast cancer Brother    Cancer Sister 14       breast   Breast cancer Sister    Colon cancer Neg Hx    Past Surgical History:  Procedure Laterality Date   ABDOMINAL HYSTERECTOMY  unsure, over 10 yrs   partial, has had abnormal pap   BLADDER REPAIR     COLONOSCOPY N/A 01/05/2014   Procedure: COLONOSCOPY;  Surgeon: Lamar CHRISTELLA Hollingshead, MD;  Location: [facility];  Service: Endoscopy;  Laterality: N/A;  12:15 PM   COLONOSCOPY N/A 07/07/2020   Procedure: COLONOSCOPY;  Surgeon: Hollingshead Lamar CHRISTELLA, MD;  Location: [facility];  Service: Endoscopy;  Laterality: N/A;  ASA II / AM procedure   Social History   Social History Narrative   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   1 grandchild   Immunization History  Administered Date(s) Administered   Fluad Quad(high Dose 65+) 11/18/2018, 01/05/2020, 11/26/2020   Fluad Trivalent(High Dose 65+) 03/06/2023   Influenza Split 02/14/2012   Influenza Whole 12/28/2000   Influenza,inj,Quad PF,6+ Mos 12/16/2012, 01/06/2014, 01/19/2016, 12/10/2017   Influenza-Unspecified 12/27/2021   Moderna SARS-COV2 Booster Vaccination 04/20/2021   Moderna Sars-Covid-2 Vaccination 05/02/2019, 05/31/2019, 01/29/2020, 10/04/2020   Pneumococcal Conjugate-13 09/25/2017   Pneumococcal Polysaccharide-23 09/30/2018   Td 09/17/2003   Tdap 01/06/2014   Zoster, Live 12/17/2012     Objective: Vital Signs: BP 127/68 (BP Location: Left Arm, Patient Position: Sitting, Cuff Size: Normal)   Pulse 65   Resp 16   Ht 5' 3 (1.6 m)   Wt 130 lb 3.2 oz (59.1 kg)   BMI 23.06 kg/m     Physical Exam Vitals and nursing note reviewed.  Constitutional:      Appearance: She is well-developed.  HENT:     Head: Normocephalic and atraumatic.  Eyes:     Conjunctiva/sclera: Conjunctivae normal.  Cardiovascular:     Rate and Rhythm: Normal rate and regular rhythm.     Heart sounds: Normal heart sounds.  Pulmonary:     Effort: Pulmonary effort is normal.     Breath sounds: Normal breath sounds.  Abdominal:     General: Bowel sounds are normal.     Palpations: Abdomen is soft.  Musculoskeletal:     Cervical back: Normal range of motion.  Lymphadenopathy:     Cervical: No cervical adenopathy.  Skin:    General: Skin is warm and dry.     Capillary Refill: Capillary refill takes less than 2 seconds.  Neurological:     Mental Status: She is alert and oriented to person, place, and time.  Psychiatric:        Behavior: Behavior normal.  Musculoskeletal Exam: C-spine has limited range of motion.  Thoracic and lumbar spine good range of motion.  Shoulder joints, elbow joints, wrist joints, MCPs, PIPs, DIPs have good range of motion with no synovitis.  Complete fist formation bilaterally.  Hip joints have good range of motion with no groin pain.  Knee joints have good range of motion no warmth or effusion.  Mild crepitus noted with range of motion of the left knee.  Ankle joints have good range of motion no tenderness or joint swelling.  CDAI Exam: CDAI Score: -- Patient Global: --; Provider Global: -- Swollen: --; Tender: -- Joint Exam 10/31/2023   No joint exam has been documented for this visit   There is currently no information documented on the homunculus. Go to the Rheumatology activity and complete the homunculus joint exam.  Investigation: No additional findings.  Imaging: No results found.  Recent Labs: Lab Results  Component Value Date   WBC 2.9 (L) 04/11/2023   HGB 12.9 04/11/2023   PLT 156 04/11/2023   NA 141 09/19/2023   K 4.6 09/19/2023    CL 103 09/19/2023   CO2 22 09/19/2023   GLUCOSE 82 09/19/2023   BUN 16 09/19/2023   CREATININE 1.15 (H) 09/19/2023   BILITOT 0.4 09/19/2023   ALKPHOS 42 (L) 09/19/2023   AST 23 09/19/2023   ALT 19 09/19/2023   PROT 7.2 09/19/2023   ALBUMIN 4.1 09/19/2023   CALCIUM 9.3 09/19/2023   GFRAA 47 (L) 11/28/2019   QFTBGOLDPLUS NEGATIVE 08/03/2021    Speciality Comments: Plaquenil  eye exam 08/22/2023 WNL @ [facility] center F/U 1 year    Procedures:  No procedures performed Allergies: Sulfa antibiotics and Sulfonamide derivatives     Assessment / Plan:     Visit Diagnoses: Other systemic lupus erythematosus with other organ involvement (HCC) - History of dsDNA +64, Smith + 5.5, RNP+ 6.5, SSA(Ro) Negative, SSB (La) Negative, presented initially with nephritis, hair loss, arthralgias, dermatitis: She has not had any signs or symptoms of a systemic lupus flare.  She is clinically been doing well taking Imuran  50 mg 1 tablet daily and Plaquenil  200 mg 1 tablet by mouth twice daily Monday through Friday.  She is tolerating combination therapy without any side effects and has not had any recent gaps in therapy.  No recent or recurrent infections.  Her energy level has been stable.  She has not noticed any increased arthralgias and has no synovitis on examination.  She experiences some stiffness in the left knee after sitting for prolonged periods of time and rising from a seated position.  She has mild crepitus in the right knee but no effusion noted.  No recent rashes.  No malar rash noted.  No oral or nasal ulcerations.  No increased sicca symptoms.  She uses Systane eyedrops as needed for dry eyes.  She has not had any symptoms of Raynaud's phenomenon. She remains under the close follow-up of Dr. Rachele and had recent lab work updated-plan to try to obtain results.  If the recommended labs were not obtained she will return for walk-in labs.  Future orders were placed today.  She will remain on  Imuran  and Plaquenil  as prescribed.  Refills of both medications will be sent to the pharmacy today.  She is advised to notify us  if she develops any new or worsening symptoms.  She will follow-up in the office in 5 months or sooner if needed.  High risk medication use - Imuran  50 mg 1 tablet  daily and plaquenil  200 mg 1 tablet by mouth twice daily M-F. CMP updated on 09/19/23. Plan to obtain recent lab results.   Plaquenil  eye exam 08/22/2023 WNL @ [facility] center F/U 1 year.   Lupus nephritis (HCC) - Treated with Cytoxan IV and prednisone  for 1 year.  Followed closely by Dr. Rachele every 3 months-Recent lab work obtained-called to obtain records.   Chronic pain of left knee: She had a left knee joint cortisone injection performed on 01/03/2022.  Her left knee joint pain has been well-controlled.  On examination she has good range of motion with mild crepitus.  She experiences some stiffness and discomfort when rising from a seated position after sitting for prolonged period of time.  Overall her symptoms have been well-controlled. Discussed the importance of regular exercise and lower extremity strengthening.   Degeneration of intervertebral disc of lumbar region without discogenic back pain or lower extremity pain: Not currently symptomatic.   Age-related osteoporosis without current pathological fracture:  Previous DEXA 01/19/2020: The BMD measured at Forearm Radius 33% is 0.549 g/cm2 with a T-score of -2.3.   DEXA updated on 04/27/23: The BMD measured at Forearm Radius 33% is 0.541 g/cm2 with a T-score of -2.3.  She is taking fosamax  70 mg 1 tablet by mouth once weekly and a calcium and vitamin D  supplement daily for management of osteoporosis.   Other medical conditions are listed as follows:   History of hyperlipidemia  History of proteinuria syndrome  History of prediabetes  History of diverticulosis  History of anemia  History of renal insufficiency  History of adenomatous  polyp of colon  Murmur - Evaluated by Dr. Alvan on 05/02/2023--2/6 systolic murmur noted.  Echocardiogram 05/15/23: Left ventricular ejection fraction, by estimation, is 50 to 55%.  Orders: Orders Placed This Encounter  Procedures   Protein / creatinine ratio, urine   CBC with Differential/Platelet   ANA   Anti-DNA antibody, double-stranded   Sedimentation rate   C3 and C4   Comprehensive metabolic panel with GFR   Meds ordered this encounter  Medications   azaTHIOprine  (IMURAN ) 50 MG tablet    Sig: Take 1 tablet (50 mg total) by mouth daily.    Dispense:  90 tablet    Refill:  0   hydroxychloroquine  (PLAQUENIL ) 200 MG tablet    Sig: TAKE ONE TABLET BY MOUTH TWICE A DAY MONDAY THROUGH FRIDAY.    Dispense:  120 tablet    Refill:  0    Follow-Up Instructions: Return in about 5 months (around 04/01/2024) for Systemic lupus erythematosus, Osteoporosis.   Waddell CHRISTELLA Craze, PA-C  Note - This record has been created using Dragon software.  Chart creation errors have been sought, but may not always  have been located. Such creation errors do not reflect on  the standard of medical care.

## 2023-10-29 ENCOUNTER — Ambulatory Visit: Admitting: Physician Assistant

## 2023-10-31 ENCOUNTER — Encounter: Payer: Self-pay | Admitting: Physician Assistant

## 2023-10-31 ENCOUNTER — Ambulatory Visit: Attending: Physician Assistant | Admitting: Physician Assistant

## 2023-10-31 VITALS — BP 127/68 | HR 65 | Resp 16 | Ht 63.0 in | Wt 130.2 lb

## 2023-10-31 DIAGNOSIS — G8929 Other chronic pain: Secondary | ICD-10-CM

## 2023-10-31 DIAGNOSIS — Z79899 Other long term (current) drug therapy: Secondary | ICD-10-CM

## 2023-10-31 DIAGNOSIS — M810 Age-related osteoporosis without current pathological fracture: Secondary | ICD-10-CM | POA: Diagnosis not present

## 2023-10-31 DIAGNOSIS — M25512 Pain in left shoulder: Secondary | ICD-10-CM | POA: Diagnosis not present

## 2023-10-31 DIAGNOSIS — Z860101 Personal history of adenomatous and serrated colon polyps: Secondary | ICD-10-CM

## 2023-10-31 DIAGNOSIS — Z862 Personal history of diseases of the blood and blood-forming organs and certain disorders involving the immune mechanism: Secondary | ICD-10-CM

## 2023-10-31 DIAGNOSIS — Z87448 Personal history of other diseases of urinary system: Secondary | ICD-10-CM

## 2023-10-31 DIAGNOSIS — R011 Cardiac murmur, unspecified: Secondary | ICD-10-CM

## 2023-10-31 DIAGNOSIS — M3219 Other organ or system involvement in systemic lupus erythematosus: Secondary | ICD-10-CM

## 2023-10-31 DIAGNOSIS — M3214 Glomerular disease in systemic lupus erythematosus: Secondary | ICD-10-CM

## 2023-10-31 DIAGNOSIS — Z8639 Personal history of other endocrine, nutritional and metabolic disease: Secondary | ICD-10-CM

## 2023-10-31 DIAGNOSIS — Z8719 Personal history of other diseases of the digestive system: Secondary | ICD-10-CM

## 2023-10-31 DIAGNOSIS — M25562 Pain in left knee: Secondary | ICD-10-CM

## 2023-10-31 DIAGNOSIS — M51369 Other intervertebral disc degeneration, lumbar region without mention of lumbar back pain or lower extremity pain: Secondary | ICD-10-CM | POA: Diagnosis not present

## 2023-10-31 DIAGNOSIS — Z87898 Personal history of other specified conditions: Secondary | ICD-10-CM

## 2023-10-31 MED ORDER — HYDROXYCHLOROQUINE SULFATE 200 MG PO TABS
ORAL_TABLET | ORAL | 0 refills | Status: DC
Start: 2023-10-31 — End: 2024-02-13

## 2023-10-31 MED ORDER — AZATHIOPRINE 50 MG PO TABS: 50.0000 mg | ORAL_TABLET | Freq: Every day | ORAL | 0 refills | Status: DC

## 2023-11-21 ENCOUNTER — Ambulatory Visit (INDEPENDENT_AMBULATORY_CARE_PROVIDER_SITE_OTHER): Payer: Self-pay | Admitting: Family Medicine

## 2023-11-21 ENCOUNTER — Encounter: Payer: Self-pay | Admitting: Family Medicine

## 2023-11-21 VITALS — BP 116/74 | HR 61 | Resp 16 | Ht 63.0 in | Wt 128.0 lb

## 2023-11-21 DIAGNOSIS — R7303 Prediabetes: Secondary | ICD-10-CM

## 2023-11-21 DIAGNOSIS — E782 Mixed hyperlipidemia: Secondary | ICD-10-CM

## 2023-11-21 DIAGNOSIS — Z0001 Encounter for general adult medical examination with abnormal findings: Secondary | ICD-10-CM | POA: Diagnosis not present

## 2023-11-21 MED ORDER — ALENDRONATE SODIUM 70 MG PO TABS: ORAL_TABLET | ORAL | 1 refills | Status: AC

## 2023-11-21 MED ORDER — AMLODIPINE BESYLATE 2.5 MG PO TABS: 2.5000 mg | ORAL_TABLET | Freq: Every day | ORAL | 3 refills | Status: AC

## 2023-11-21 NOTE — Patient Instructions (Addendum)
 F? In January  Pls schedule for flu vaccine  Pals get covid vaccine at pharmacy   Fasting lipid, HBA1C first week in January  Keep up great habits, exercise and healthy food choice  Thanks for choosing [facility], we consider it a privelige to serve you.

## 2023-11-21 NOTE — Assessment & Plan Note (Signed)

## 2023-11-21 NOTE — Progress Notes (Signed)
    [name]     MRN: [id number]      DOB: 07-Jan-1951  Chief Complaint  Patient presents with   Annual Exam    HPI: Patient is in for annual physical exam. No other health concerns are expressed or addressed at the visit. Recent labs,  are reviewed. Immunization is reviewed , and  updated if needed.   PE: BP 116/74   Pulse 61   Resp 16   Ht 5' 3 (1.6 m)   Wt 128 lb (58.1 kg)   SpO2 99%   BMI 22.67 kg/m   Pleasant  female, alert and oriented x 3, in no cardio-pulmonary distress. Afebrile. HEENT No facial trauma or asymetry. Sinuses non tender.  Extra occullar muscles intact.. External ears normal, . Neck: supple, no adenopathy,JVD or thyromegaly.No bruits.  Chest: Clear to ascultation bilaterally.No crackles or wheezes. Non tender to palpation    Cardiovascular system; Heart sounds normal,  S1 and  S2 ,no S3.  No murmur, or thrill. Apical beat not displaced Peripheral pulses normal.  Abdomen: Soft, non tender, no organomegaly or masses. No bruits. Bowel sounds normal. No guarding, tenderness or rebound.  Musculoskeletal exam: Full ROM of spine, hips , shoulders and knees. No deformity ,swelling or crepitus noted. No muscle wasting or atrophy.   Neurologic: Cranial nerves 2 to 12 intact. Power, tone ,sensation and reflexes normal throughout. No disturbance in gait. No tremor.  Skin: Intact, no ulceration, erythema , scaling or rash noted. Pigmentation normal throughout  Psych; Normal mood and affect. Judgement and concentration normal   Assessment & Plan:  Encounter for Medicare annual examination with abnormal findings Annual exam as documented. Counseling done  re healthy lifestyle involving commitment to 150 minutes exercise per week, heart healthy diet, and attaining healthy weight.The importance of adequate sleep also discussed. Regular seat belt use and home safety, is also discussed. Changes in health habits are decided on by the  patient with goals and time frames  set for achieving them. Immunization and cancer screening needs are specifically addressed at this visit.

## 2023-12-10 ENCOUNTER — Ambulatory Visit

## 2024-01-02 DIAGNOSIS — Z23 Encounter for immunization: Secondary | ICD-10-CM | POA: Diagnosis not present

## 2024-01-31 DIAGNOSIS — L648 Other androgenic alopecia: Secondary | ICD-10-CM | POA: Diagnosis not present

## 2024-02-04 ENCOUNTER — Telehealth: Payer: Self-pay | Admitting: Family Medicine

## 2024-02-04 NOTE — Telephone Encounter (Signed)
 Copied from CRM [id number]. Topic: General - Other >> Feb 04, 2024 11:22 AM Selinda RAMAN wrote: Reason for CRM: The patient called in stating she got a call which showed up on her screen as [facility] but then it said [facility] a Home Health follow up. She states she thinks she remembers at her last visit discussing home health with her provider but she just wasn't sure. I tried to check her last visit notes from August but didn't see anything mentioned. She just wanted to know if this was legit or not as she said she just gets so many calls and therefore she doesn't know what to trust. Please assist patient further.

## 2024-02-04 NOTE — Telephone Encounter (Signed)
 Spoke to pt.

## 2024-02-05 ENCOUNTER — Other Ambulatory Visit (HOSPITAL_COMMUNITY)
Admission: RE | Admit: 2024-02-05 | Discharge: 2024-02-05 | Disposition: A | Discharge status: Home / Self Care | Source: Ambulatory Visit | Attending: Nephrology | Admitting: Nephrology

## 2024-02-05 DIAGNOSIS — N1831 Chronic kidney disease, stage 3a: Secondary | ICD-10-CM | POA: Insufficient documentation

## 2024-02-05 DIAGNOSIS — R809 Proteinuria, unspecified: Secondary | ICD-10-CM | POA: Diagnosis not present

## 2024-02-05 DIAGNOSIS — D631 Anemia in chronic kidney disease: Secondary | ICD-10-CM | POA: Diagnosis not present

## 2024-02-05 LAB — RENAL FUNCTION PANEL
Albumin: 4.3 g/dL (ref 3.5–5.0)
Anion gap: 10 (ref 5–15)
BUN: 23 mg/dL (ref 8–23)
CO2: 27 mmol/L (ref 22–32)
Calcium: 9.6 mg/dL (ref 8.9–10.3)
Chloride: 104 mmol/L (ref 98–111)
Creatinine, Ser: 1.13 mg/dL — ABNORMAL HIGH (ref 0.44–1.00)
GFR, Estimated: 52 mL/min — ABNORMAL LOW (ref >60)
Glucose, Bld: 90 mg/dL (ref 70–99)
Phosphorus: 4.3 mg/dL (ref 2.5–4.6)
Potassium: 4.9 mmol/L (ref 3.5–5.1)
Sodium: 140 mmol/L (ref 135–145)

## 2024-02-05 LAB — CBC
HCT: 38.6 % (ref 36.0–46.0)
Hemoglobin: 12.5 g/dL (ref 12.0–15.0)
MCH: 28.5 pg (ref 26.0–34.0)
MCHC: 32.4 g/dL (ref 30.0–36.0)
MCV: 87.9 fL (ref 80.0–100.0)
Platelets: 179 K/uL (ref 150–400)
RBC: 4.39 MIL/uL (ref 3.87–5.11)
RDW: 14.6 % (ref 11.5–15.5)
WBC: 3.1 K/uL — ABNORMAL LOW (ref 4.0–10.5)
nRBC: 0 % (ref 0.0–0.2)

## 2024-02-05 LAB — PROTEIN / CREATININE RATIO, URINE
Creatinine, Urine: 110 mg/dL
Protein Creatinine Ratio: 0.08 mg/mg{creat} (ref 0.00–0.15)
Total Protein, Urine: 8 mg/dL

## 2024-02-08 DIAGNOSIS — M329 Systemic lupus erythematosus, unspecified: Secondary | ICD-10-CM | POA: Diagnosis not present

## 2024-02-08 DIAGNOSIS — I129 Hypertensive chronic kidney disease with stage 1 through stage 4 chronic kidney disease, or unspecified chronic kidney disease: Secondary | ICD-10-CM | POA: Diagnosis not present

## 2024-02-08 DIAGNOSIS — Z7960 Long term (current) use of unspecified immunomodulators and immunosuppressants: Secondary | ICD-10-CM | POA: Diagnosis not present

## 2024-02-08 DIAGNOSIS — N1831 Chronic kidney disease, stage 3a: Secondary | ICD-10-CM | POA: Diagnosis not present

## 2024-02-13 ENCOUNTER — Other Ambulatory Visit: Payer: Self-pay | Admitting: Physician Assistant

## 2024-02-13 DIAGNOSIS — M3219 Other organ or system involvement in systemic lupus erythematosus: Secondary | ICD-10-CM

## 2024-02-13 NOTE — Telephone Encounter (Signed)
 Last Fill: 10/31/2023  Eye exam: 08/22/2023 WNL    Labs: 02/05/2024 WBC 3.1, Creat. 1.13, GFR 52  Next Visit: 04/02/2024  Last Visit: 10/31/2023  IK:Nuyzm systemic lupus erythematosus with other organ involvement   Current Dose per office note 10/31/2023: plaquenil  200 mg 1 tablet by mouth twice daily M-F.   Okay to refill Plaquenil ?

## 2024-02-25 ENCOUNTER — Ambulatory Visit

## 2024-02-25 VITALS — BP 132/78 | Ht 63.0 in | Wt 135.0 lb

## 2024-02-25 DIAGNOSIS — Z0000 Encounter for general adult medical examination without abnormal findings: Secondary | ICD-10-CM

## 2024-02-25 NOTE — Progress Notes (Signed)
 Chief Complaint  Patient presents with   Medicare Wellness     Subjective:   [name] is a 73 y.o. female who presents for a Medicare Annual Wellness Visit.  Visit info / Clinical Intake: Medicare Wellness Visit Type:: Subsequent Annual Wellness Visit Persons participating in visit and providing information:: patient Medicare Wellness Visit Mode:: Telephone If telephone:: video declined Since this visit was completed virtually, some vitals may be partially provided or unavailable. Missing vitals are due to the limitations of the virtual format.: Documented vitals are patient reported If Telephone or Video please confirm:: I connected with patient using audio/video enable telemedicine. I verified patient identity with two identifiers, discussed telehealth limitations, and patient agreed to proceed. Patient Location:: home Provider Location:: office Interpreter Needed?: No Pre-visit prep was completed: yes AWV questionnaire completed by patient prior to visit?: no Living arrangements:: (!) lives alone Patient's Overall Health Status Rating: very good Typical amount of pain: none Does pain affect daily life?: no Are you currently prescribed opioids?: no  Dietary Habits and Nutritional Risks How many meals a day?: 2 Eats fruit and vegetables daily?: yes Most meals are obtained by: preparing own meals In the last 2 weeks, have you had any of the following?: none Diabetic:: no  Functional Status Activities of Daily Living (to include ambulation/medication): Independent Ambulation: Independent Medication Administration: Independent Home Management (perform basic housework or laundry): Independent Manage your own finances?: yes Primary transportation is: driving Concerns about vision?: no *vision screening is required for WTM* Concerns about hearing?: no  Fall Screening Falls in the past year?: 0 Number of falls in past year: 0 Was there an injury with Fall?: 0 Fall  Risk Category Calculator: 0 Patient Fall Risk Level: Low Fall Risk  Fall Risk Patient at Risk for Falls Due to: No Fall Risks Fall risk Follow up: Falls evaluation completed; Education provided; Falls prevention discussed  Home and Transportation Safety: All rugs have non-skid backing?: yes All stairs or steps have railings?: yes Grab bars in the bathtub or shower?: yes Have non-skid surface in bathtub or shower?: yes Good home lighting?: yes Regular seat belt use?: yes Hospital stays in the last year:: no  Cognitive Assessment Difficulty concentrating, remembering, or making decisions? : no Will 6CIT or Mini Cog be Completed: no 6CIT or Mini Cog Declined: patient alert, oriented, able to answer questions appropriately and recall recent events  Advance Directives (For Healthcare) Does Patient Have a Medical Advance Directive?: No Would patient like information on creating a medical advance directive?: No - Patient declined  Reviewed/Updated  Reviewed/Updated: Reviewed All (Medical, Surgical, Family, Medications, Allergies, Care Teams, Patient Goals)    Allergies (verified) Sulfa antibiotics and Sulfonamide derivatives   Current Medications (verified) Outpatient Encounter Medications as of 02/25/2024  Medication Sig   alendronate  (FOSAMAX ) 70 MG tablet TAKE 1 TABLET BY MOUTH WITH A FULL GLASSOF WATER  ON AN EMPTY STOMACH ONCE A WEEK   amLODipine  (NORVASC ) 2.5 MG tablet Take 1 tablet (2.5 mg total) by mouth daily.   azaTHIOprine  (IMURAN ) 50 MG tablet Take 1 tablet (50 mg total) by mouth daily.   Calcium Carb-Cholecalciferol (CALCIUM 600+D) 600-800 MG-UNIT TABS Take 2 tablets by mouth daily.   cholecalciferol (VITAMIN D3) 25 MCG (1000 UNIT) tablet Take 2,000 Units by mouth daily.   Cyanocobalamin (B-12 PO) Take 1 tablet by mouth daily.   ELDERBERRY PO Take by mouth. (Patient taking differently: Take by mouth as needed.)   hydroxychloroquine  (PLAQUENIL ) 200 MG tablet TAKE ONE  TABLET BY MOUTH TWICE A DAY MONDAY THROUGH FRIDAY   Multiple Vitamins-Minerals (SYSTANE ICAPS AREDS2) CAPS Take 2 capsules by mouth daily.   UNABLE TO FIND Med Name: Blood pressure monitor and cuff to check blood pressure daily. DX : I10   vitamin C (ASCORBIC ACID) 500 MG tablet Take 1,000 mg by mouth daily.   No facility-administered encounter medications on file as of 02/25/2024.    History: Past Medical History:  Diagnosis Date   Arthritis 02/13/2010   abnormal MRI lumbar and c spine  with disc disease   Heart murmur    History of anemia    History of diverticulosis    History of prediabetes    Osteoporosis    SLE (systemic lupus erythematosus) (HCC) dx apprx 1990   reports immunotherapy, cytoxan and steroids at [facility], no f/u for over 47yrs   Past Surgical History:  Procedure Laterality Date   ABDOMINAL HYSTERECTOMY  unsure, over 10 yrs   partial, has had abnormal pap   BLADDER REPAIR     COLONOSCOPY N/A 01/05/2014   Procedure: COLONOSCOPY;  Surgeon: Lamar CHRISTELLA Hollingshead, MD;  Location: [facility];  Service: Endoscopy;  Laterality: N/A;  12:15 PM   COLONOSCOPY N/A 07/07/2020   Procedure: COLONOSCOPY;  Surgeon: Hollingshead Lamar CHRISTELLA, MD;  Location: [facility];  Service: Endoscopy;  Laterality: N/A;  ASA II / AM procedure   Family History  Problem Relation Age of Onset   Heart disease Father    Cancer Brother 65       breast   Breast cancer Brother    Cancer Sister 63       breast   Breast cancer Sister    Colon cancer Neg Hx    Social History   Occupational History   Not on file  Tobacco Use   Smoking status: Never    Passive exposure: Past   Smokeless tobacco: Never  Vaping Use   Vaping status: Never Used  Substance and Sexual Activity   Alcohol use: No   Drug use: No   Sexual activity: Not Currently   Tobacco Counseling Counseling given: Yes  SDOH Screenings   Food Insecurity: No Food Insecurity (02/25/2024)  Housing: Low Risk  (02/25/2024)  Transportation  Needs: No Transportation Needs (02/25/2024)  Utilities: Not At Risk (02/25/2024)  Alcohol Screen: Low Risk  (01/05/2021)  Depression (PHQ2-9): Low Risk  (02/25/2024)  Financial Resource Strain: Low Risk  (01/05/2021)  Physical Activity: Sufficiently Active (02/25/2024)  Social Connections: Moderately Integrated (02/25/2024)  Stress: No Stress Concern Present (02/25/2024)  Tobacco Use: Low Risk  (02/25/2024)  Health Literacy: Adequate Health Literacy (02/25/2024)   See flowsheets for full screening details  Depression Screen PHQ 2 & 9 Depression Scale- Over the past 2 weeks, how often have you been bothered by any of the following problems? Little interest or pleasure in doing things: 0 Feeling down, depressed, or hopeless (PHQ Adolescent also includes...irritable): 0 PHQ-2 Total Score: 0     Goals Addressed             This Visit's Progress    Increase physical activity   On track    Start exercising more, several times a week.              Objective:    Today's Vitals   02/25/24 1539  BP: 132/78  Weight: 135 lb (61.2 kg)  Height: 5' 3 (1.6 m)   Body mass index is 23.91 kg/m.  Hearing/Vision screen Hearing Screening -  Comments:: Patient denies any hearing difficulties.   Vision Screening - Comments:: Wears rx glasses - up to date with routine eye exams with  [facility] Immunizations and Health Maintenance Health Maintenance  Topic Date Due   Medicare Annual Wellness (AWV)  01/18/2023   COVID-19 Vaccine (6 - 2025-26 season) 11/26/2023   DTaP/Tdap/Td (3 - Td or Tdap) 01/07/2024   Mammogram  04/26/2024   Zoster Vaccines- Shingrix (1 of 2) 10/08/2024 (Originally 06/13/1969)   Bone Density Scan  04/26/2025   Pneumococcal Vaccine: 50+ Years  Completed   Influenza Vaccine  Completed   Hepatitis C Screening  Completed   Meningococcal B Vaccine  Aged Out   Colonoscopy  Discontinued        Assessment/Plan:  This is a routine wellness examination for  [place].  Patient Care Team: Antonetta Rollene BRAVO, MD as PCP - General Rourk, Lamar HERO, MD as Consulting Physician (Gastroenterology) Rachele Gaynell RAMAN, MD as Referring Physician (Nephrology) Alvan Dorn FALCON, MD as Consulting Physician (Cardiology) Cheryl Waddell HERO, PA-C as Physician Assistant (Rheumatology) Harrie Agent, MD as Consulting Physician (Ophthalmology) Dolphus Reiter, MD as Consulting Physician (Rheumatology)  I have personally reviewed and noted the following in the patient's chart:   Medical and social history Use of alcohol, tobacco or illicit drugs  Current medications and supplements including opioid prescriptions. Functional ability and status Nutritional status Physical activity Advanced directives List of other physicians Hospitalizations, surgeries, and ER visits in previous 12 months Vitals Screenings to include cognitive, depression, and falls Referrals and appointments  No orders of the defined types were placed in this encounter.  In addition, I have reviewed and discussed with patient certain preventive protocols, quality metrics, and best practice recommendations. A written personalized care plan for preventive services as well as general preventive health recommendations were provided to patient.   Zakyria Metzinger, CMA   02/25/2024   No follow-ups on file.  After Visit Summary: (MyChart) Due to this being a telephonic visit, the after visit summary with patients personalized plan was offered to patient via MyChart   Nurse Notes: mammogram ordered. Patient provided with phone number to call and schedule.

## 2024-02-25 NOTE — Patient Instructions (Signed)
 [name],  Thank you for taking the time for your Medicare Wellness Visit. I appreciate your continued commitment to your health goals. Please review the care plan we discussed, and feel free to reach out if I can assist you further.  Please note that Annual Wellness Visits do not include a physical exam. Some assessments may be limited, especially if the visit was conducted virtually. If needed, we may recommend an in-person follow-up with your provider.  Ongoing Care Seeing your primary care provider every 3 to 6 months helps us  monitor your health and provide consistent, personalized care.   Medicare Wellness 1 year follow up with Wellness Nurse: February 25, 2025 at 3:10 pm in office  Referrals If a referral was made during today's visit and you haven't received any updates within two weeks, please contact the referred provider directly to check on the status.  Mammogram at [facility] Call [phone] to schedule your screening No perfumes, lotions, or deodorants the day of your screening. You can schedule your mammogram through mychart!   Recommended Screenings:  Health Maintenance  Topic Date Due   COVID-19 Vaccine (6 - 2025-26 season) 11/26/2023   DTaP/Tdap/Td vaccine (3 - Td or Tdap) 01/07/2024   Breast Cancer Screening  04/26/2024   Zoster (Shingles) Vaccine (1 of 2) 10/08/2024*   Medicare Annual Wellness Visit  02/24/2025   Osteoporosis screening with Bone Density Scan  04/26/2025   Pneumococcal Vaccine for age over 23  Completed   Flu Shot  Completed   Hepatitis C Screening  Completed   Meningitis B Vaccine  Aged Out   Colon Cancer Screening  Discontinued  *Topic was postponed. The date shown is not the original due date.       02/25/2024    3:41 PM  Advanced Directives  Does Patient Have a Medical Advance Directive? No  Would patient like information on creating a medical advance directive? No - Patient declined    Vision: Annual vision screenings are  recommended for early detection of glaucoma, cataracts, and diabetic retinopathy. These exams can also reveal signs of chronic conditions such as diabetes and high blood pressure.  Dental: Annual dental screenings help detect early signs of oral cancer, gum disease, and other conditions linked to overall health, including heart disease and diabetes.  Please see the attached documents for additional preventive care recommendations.

## 2024-02-28 ENCOUNTER — Encounter

## 2024-03-20 NOTE — Progress Notes (Unsigned)
 "  Office Visit Note  Patient: [name]             Date of Birth: 1951/03/08           MRN: [id number]             PCP: Antonetta Rollene BRAVO, MD Referring: Antonetta Rollene BRAVO, MD Visit Date: 04/02/2024 Occupation: Data Unavailable  Subjective:  Left knee pain   History of Present Illness: [name] is a 73 y.o. female with history of systemic lupus and lupus nephritis.  Patient remains on Imuran  50 mg 1 tablet daily and plaquenil  200 mg 1 tablet by mouth twice daily M-F.  She is tolerating combination therapy without any side effects and has not had any gaps in therapy.  She denies any signs or symptoms of a systemic lupus flare.  Patient reports that she had lab work performed under the care of Dr. Rachele on 02/05/2024. She has not had any increased fatigue, rashes, oral or nasal ulcerations, or Raynaud's phenomenon.  She continues to have chronic sicca symptoms which have been stable.  She is been avoiding direct sun exposure as encouraged.  Patient experiences intermittent pain affecting the left knee joint.  At times she has difficulty rising from a seated position due to discomfort and stiffness.  At times she also has pain in the left piriformis region and soreness on the anterior aspect of the left ankle.  She has gone to physical therapy in the past which was helpful.  She has tried to remain active exercising on a regular basis.  She denies any joint swelling at this time.  She uses topical ginger for symptomatic relief. She denies any new medical conditions.  She denies any recent or recurrent infections.   Activities of Daily Living:  Patient reports morning stiffness for 5-10 minutes.   Patient Reports nocturnal pain.  Difficulty dressing/grooming: Denies Difficulty climbing stairs: Denies Difficulty getting out of chair: Denies Difficulty using hands for taps, buttons, cutlery, and/or writing: Denies  Review of Systems  Constitutional:  Negative for fatigue.   HENT:  Positive for mouth dryness. Negative for mouth sores.   Eyes:  Positive for dryness.  Respiratory:  Negative for shortness of breath.   Cardiovascular:  Negative for chest pain and palpitations.  Gastrointestinal:  Negative for blood in stool, constipation and diarrhea.  Endocrine: Negative for increased urination.  Genitourinary:  Negative for involuntary urination.  Musculoskeletal:  Positive for joint pain, joint pain, myalgias, morning stiffness and myalgias. Negative for gait problem, joint swelling, muscle weakness and muscle tenderness.  Skin:  Positive for hair loss and sensitivity to sunlight. Negative for color change and rash.  Allergic/Immunologic: Negative for susceptible to infections.  Neurological:  Negative for dizziness and headaches.  Hematological:  Negative for swollen glands.  Psychiatric/Behavioral:  Negative for depressed mood and sleep disturbance. The patient is not nervous/anxious.     PMFS History:  Patient Active Problem List   Diagnosis Date Noted   Annual visit for general adult medical examination with abnormal findings 11/21/2023   Dyslipidemia 08/21/2023   Low vitamin D  level 08/21/2023   Urinary frequency 08/21/2023   Encounter for immunization 03/19/2023   Post-menopause 03/19/2023   Lupus glomerulonephritis (HCC) 03/19/2023   Encounter for Medicare annual examination with abnormal findings 11/14/2022   Murmur, cardiac 11/14/2022   Cervical spondylosis 06/12/2021   Chronic low back pain with left-sided sciatica 04/11/2021   Baker's cyst of knee, left 11/26/2020   Seborrheic  dermatitis of scalp 07/19/2020   Essential hypertension 07/19/2020   Overweight (BMI 25.0-29.9) 07/19/2020   Systemic lupus erythematosus arthritis (HCC) 02/12/2020   Allergic sinusitis 11/04/2018   Hyperlipidemia 03/08/2017   Proteinuria 03/08/2017   SLE glomerulonephritis syndrome (HCC) 09/15/2016   High risk medication use 09/15/2016   Degeneration of lumbar  intervertebral disc 09/15/2016   Personal history of other endocrine, nutritional and metabolic disease 93/77/7981   History of anemia 09/15/2016   History of proteinuria  09/15/2016   Tubular adenoma of colon 04/30/2016   Prediabetes 11/15/2014   Cervicalgia 05/11/2014   Osteopenia 07/21/2013   Anemia 12/22/2012   Systemic lupus erythematosus (HCC) 02/15/2012   H/O: gastrointestinal disease 10/01/2007    Past Medical History:  Diagnosis Date   Arthritis 02/13/2010   abnormal MRI lumbar and c spine  with disc disease   Heart murmur    History of anemia    History of diverticulosis    History of prediabetes    Osteoporosis    SLE (systemic lupus erythematosus) (HCC) dx apprx 1990   reports immunotherapy, cytoxan and steroids [facility], no f/u for over 18yrs    Family History  Problem Relation Age of Onset   Heart disease Father    Cancer Brother 63       breast   Breast cancer Brother    Cancer Sister 103       breast   Breast cancer Sister    Colon cancer Neg Hx    Past Surgical History:  Procedure Laterality Date   ABDOMINAL HYSTERECTOMY  unsure, over 10 yrs   partial, has had abnormal pap   BLADDER REPAIR     COLONOSCOPY N/A 01/05/2014   Procedure: COLONOSCOPY;  Surgeon: Lamar CHRISTELLA Hollingshead, MD;  Location: [facility];  Service: Endoscopy;  Laterality: N/A;  12:15 PM   COLONOSCOPY N/A 07/07/2020   Procedure: COLONOSCOPY;  Surgeon: Hollingshead Lamar CHRISTELLA, MD;  Location: [facility];  Service: Endoscopy;  Laterality: N/A;  ASA II / AM procedure   Social History[1] Social History   Social History Narrative   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   1 grandchild     Immunization History  Administered Date(s) Administered   Fluad Quad(high Dose 65+) 11/18/2018, 01/05/2020, 11/26/2020   Fluad Trivalent(High Dose 65+) 03/06/2023   Influenza Split 02/14/2012   Influenza Whole 12/28/2000   Influenza,inj,Quad PF,6+ Mos 12/16/2012, 01/06/2014, 01/19/2016, 12/10/2017    Influenza-Unspecified 12/27/2021   Moderna Covid-19 Fall Seasonal Vaccine 14yrs & older 01/06/2022   Moderna Covid-19 Vaccine Bivalent Booster 29yrs & up 04/20/2021   Moderna SARS-COV2 Booster Vaccination 04/20/2021   Moderna Sars-Covid-2 Vaccination 05/02/2019, 05/31/2019, 01/29/2020, 10/04/2020   Pneumococcal Conjugate-13 09/25/2017   Pneumococcal Polysaccharide-23 09/30/2018   Td 09/17/2003   Tdap 01/06/2014   Zoster, Live 12/17/2012     Objective: Vital Signs: BP 131/78   Pulse 66   Temp 98.4 F (36.9 C)   Resp 14   Ht 5' 3 (1.6 m)   Wt 131 lb (59.4 kg)   BMI 23.21 kg/m    Physical Exam Vitals and nursing note reviewed.  Constitutional:      Appearance: She is well-developed.  HENT:     Head: Normocephalic and atraumatic.  Eyes:     Conjunctiva/sclera: Conjunctivae normal.  Cardiovascular:     Rate and Rhythm: Normal rate and regular rhythm.     Heart sounds: Normal heart sounds.  Pulmonary:     Effort: Pulmonary effort is normal.  Breath sounds: Normal breath sounds.  Abdominal:     General: Bowel sounds are normal.     Palpations: Abdomen is soft.  Musculoskeletal:     Cervical back: Normal range of motion.  Lymphadenopathy:     Cervical: No cervical adenopathy.  Skin:    General: Skin is warm and dry.     Capillary Refill: Capillary refill takes less than 2 seconds.  Neurological:     Mental Status: She is alert and oriented to person, place, and time.  Psychiatric:        Behavior: Behavior normal.      Musculoskeletal Exam: C-spine, thoracic spine, lumbar spine have good range of motion.  No midline spinal tenderness.  No SI joint tenderness.  Shoulder joints, elbow joints, wrist joints, MCPs, PIPs, DIPs have good range of motion with no synovitis.  Complete fist formation bilaterally.  Hip joints have good range of motion with no groin pain.  Knee joints have good range of motion no warmth or effusion.  Ankle joints have good range of motion no  tenderness or joint swelling.  No evidence of Achilles tendinitis or plantar fasciitis.   CDAI Exam: CDAI Score: -- Patient Global: --; Provider Global: -- Swollen: --; Tender: -- Joint Exam 04/02/2024   No joint exam has been documented for this visit   There is currently no information documented on the homunculus. Go to the Rheumatology activity and complete the homunculus joint exam.  Investigation: No additional findings.  Imaging: No results found.  Recent Labs: Lab Results  Component Value Date   WBC 3.1 (L) 02/05/2024   HGB 12.5 02/05/2024   PLT 179 02/05/2024   NA 140 02/05/2024   K 4.9 02/05/2024   CL 104 02/05/2024   CO2 27 02/05/2024   GLUCOSE 90 02/05/2024   BUN 23 02/05/2024   CREATININE 1.13 (H) 02/05/2024   BILITOT 0.4 09/19/2023   ALKPHOS 42 (L) 09/19/2023   AST 23 09/19/2023   ALT 19 09/19/2023   PROT 7.2 09/19/2023   ALBUMIN 4.3 02/05/2024   CALCIUM 9.6 02/05/2024   GFRAA 47 (L) 11/28/2019   QFTBGOLDPLUS NEGATIVE 08/03/2021    Speciality Comments: Plaquenil  eye exam 08/22/2023 WNL @ [facility] center F/U 1 year    Procedures:  No procedures performed Allergies: Sulfa antibiotics and Sulfonamide derivatives    Assessment / Plan:     Visit Diagnoses: Other systemic lupus erythematosus with other organ involvement (HCC) - History of dsDNA +64, Smith + 5.5, RNP+ 6.5, SSA([place]) Negative, SSB ([place]) Negative, presented initially with nephritis, hair loss, arthralgias, dermatitis: She has not had any signs or symptoms of a systemic lupus flare.  She is clinically been doing well taking Imuran  50 mg 1 tablet by mouth daily and Plaquenil  200 mg 1 tablet by mouth twice daily Monday through Friday.  She is tolerating combination therapy without any side effects and has not had any gaps in therapy.  She has not noticed any increased fatigue, symptoms of Raynaud's phenomenon, oral or nasal ulcerations, recent rashes, or increased photosensitivity.  She continues  to have chronic sicca symptoms which have been stable.  She uses Systane eyedrops for dry eyes. No synovitis was noted on examination today.  Plan to obtain the following lab work.  No medication changes will be made at this time.  She was advised to notify us  if she develops signs or symptoms of a flare.  She will follow up in 5 months or sooner if needed.  -  Plan: Protein / creatinine ratio, urine, C3 and C4, Sedimentation rate, Comprehensive metabolic panel with GFR, CBC with Differential/Platelet, Anti-DNA antibody, double-stranded  High risk medication use - Imuran  50 mg 1 tablet daily and plaquenil  200 mg 1 tablet by mouth twice daily M-F.  CBC and renal function panel updated on 02/05/24  Plaquenil  eye exam 08/22/2023 WNL @ [place] F/U 1 year.   Plan: Comprehensive metabolic panel with GFR, CBC with Differential/Platelet  Lupus nephritis (HCC) - Treated with Cytoxan IV and prednisone  for 1 year.  Followed closely by Dr. Rachele every 3 months.  Urine protein creatinine ratio WNL on 02/05/24. Plan to recheck urine protein creatinine ratio today.  - Plan: Protein / creatinine ratio, urine  Chronic left shoulder pain: Not currently symptomatic.  Good ROM with no discomfort or tenderness at this time.   Chronic pain of left knee: Evaluated by Dr. Onesimo and Dr. Margrette in the past.  Patient experiences intermittent discomfort in the left knee especially when rising from a seated position.  She has been using a knee sleeve for support and has tried to continue to exercise to prevent deconditioning.  She has been using topical ginger which has been alleviating pain and stiffness.  Offered a referral to physical therapy but she would like to hold off at this time.  Discussed the option of cortisone injection or viscosupplementation in the future if needed.  Degeneration of intervertebral disc of lumbar region without discogenic back pain or lower extremity pain: Not currently symptomatic.   No midline spinal tenderness.  No symptoms of radiculopathy at this time.  Age-related osteoporosis without current pathological fracture - DEXA 01/19/2020:BMD Radius 33% is 0.549 T-score of -2.3.   DEXA 04/27/23: The BMD Radius 0.541 g/cm2  T-score of -2.3. She is taking calcium and vitamin D  supplement daily. She takes fosamax  70 mg 1 tablet by mouth once weekly. Due to update DEXA in January 2027.  Other medical conditions are listed as follows:  History of hyperlipidemia  History of proteinuria syndrome: Urine protein creatinine ratio within normal limits on 02/05/2024.  Plan to recheck urine protein creatinine ratio today.  History of prediabetes  History of diverticulosis  History of anemia  History of renal insufficiency: CMP with GFR updated today.  History of adenomatous polyp of colon  Murmur - Evaluated by Dr. Alvan on 05/02/2023--2/6 systolic murmur noted.  Echocardiogram 05/15/23: Left ventricular ejection fraction, by estimation, is 50 to 55%.  Orders: Orders Placed This Encounter  Procedures   Protein / creatinine ratio, urine   C3 and C4   Sedimentation rate   Comprehensive metabolic panel with GFR   CBC with Differential/Platelet   Anti-DNA antibody, double-stranded   No orders of the defined types were placed in this encounter.    Follow-Up Instructions: Return in about 5 months (around 08/31/2024) for Systemic lupus erythematosus.   Waddell CHRISTELLA Craze, PA-C  Note - This record has been created using Dragon software.  Chart creation errors have been sought, but may not always  have been located. Such creation errors do not reflect on  the standard of medical care.     [1]  Social History Tobacco Use   Smoking status: Never    Passive exposure: Past   Smokeless tobacco: Never  Vaping Use   Vaping status: Never Used  Substance Use Topics   Alcohol use: No   Drug use: No   "

## 2024-04-02 ENCOUNTER — Encounter: Payer: Self-pay | Admitting: Physician Assistant

## 2024-04-02 ENCOUNTER — Ambulatory Visit: Attending: Physician Assistant | Admitting: Physician Assistant

## 2024-04-02 VITALS — BP 131/78 | HR 66 | Temp 98.4°F | Resp 14 | Ht 63.0 in | Wt 131.0 lb

## 2024-04-02 DIAGNOSIS — Z87898 Personal history of other specified conditions: Secondary | ICD-10-CM

## 2024-04-02 DIAGNOSIS — R011 Cardiac murmur, unspecified: Secondary | ICD-10-CM

## 2024-04-02 DIAGNOSIS — Z860101 Personal history of adenomatous and serrated colon polyps: Secondary | ICD-10-CM

## 2024-04-02 DIAGNOSIS — G8929 Other chronic pain: Secondary | ICD-10-CM

## 2024-04-02 DIAGNOSIS — Z8639 Personal history of other endocrine, nutritional and metabolic disease: Secondary | ICD-10-CM

## 2024-04-02 DIAGNOSIS — Z862 Personal history of diseases of the blood and blood-forming organs and certain disorders involving the immune mechanism: Secondary | ICD-10-CM | POA: Diagnosis not present

## 2024-04-02 DIAGNOSIS — M3219 Other organ or system involvement in systemic lupus erythematosus: Secondary | ICD-10-CM | POA: Diagnosis not present

## 2024-04-02 DIAGNOSIS — Z79899 Other long term (current) drug therapy: Secondary | ICD-10-CM

## 2024-04-02 DIAGNOSIS — M810 Age-related osteoporosis without current pathological fracture: Secondary | ICD-10-CM

## 2024-04-02 DIAGNOSIS — M3214 Glomerular disease in systemic lupus erythematosus: Secondary | ICD-10-CM

## 2024-04-02 DIAGNOSIS — M25512 Pain in left shoulder: Secondary | ICD-10-CM

## 2024-04-02 DIAGNOSIS — M25562 Pain in left knee: Secondary | ICD-10-CM | POA: Diagnosis not present

## 2024-04-02 DIAGNOSIS — M51369 Other intervertebral disc degeneration, lumbar region without mention of lumbar back pain or lower extremity pain: Secondary | ICD-10-CM | POA: Diagnosis not present

## 2024-04-02 DIAGNOSIS — Z87448 Personal history of other diseases of urinary system: Secondary | ICD-10-CM

## 2024-04-02 DIAGNOSIS — Z8719 Personal history of other diseases of the digestive system: Secondary | ICD-10-CM | POA: Diagnosis not present

## 2024-04-03 ENCOUNTER — Ambulatory Visit: Payer: Self-pay | Admitting: Physician Assistant

## 2024-04-03 LAB — COMPREHENSIVE METABOLIC PANEL WITH GFR
AG Ratio: 1.4 (calc) (ref 1.0–2.5)
ALT: 16 U/L (ref 6–29)
AST: 22 U/L (ref 10–35)
Albumin: 4.2 g/dL (ref 3.6–5.1)
Alkaline phosphatase (APISO): 37 U/L (ref 37–153)
BUN: 20 mg/dL (ref 7–25)
CO2: 30 mmol/L (ref 20–32)
Calcium: 9.3 mg/dL (ref 8.6–10.4)
Chloride: 105 mmol/L (ref 98–110)
Creat: 0.98 mg/dL (ref 0.60–1.00)
Globulin: 2.9 g/dL (ref 1.9–3.7)
Glucose, Bld: 89 mg/dL (ref 65–99)
Potassium: 4.3 mmol/L (ref 3.5–5.3)
Sodium: 140 mmol/L (ref 135–146)
Total Bilirubin: 0.4 mg/dL (ref 0.2–1.2)
Total Protein: 7.1 g/dL (ref 6.1–8.1)
eGFR: 62 mL/min/1.73m2

## 2024-04-03 LAB — PROTEIN / CREATININE RATIO, URINE
Creatinine, Urine: 37 mg/dL (ref 20–275)
Total Protein, Urine: <4 mg/dL — ABNORMAL LOW (ref 5–24)

## 2024-04-03 LAB — C3 AND C4
C3 Complement: 111 mg/dL (ref 83–193)
C4 Complement: 21 mg/dL (ref 15–57)

## 2024-04-03 LAB — CBC WITH DIFFERENTIAL/PLATELET
Absolute Lymphocytes: 756 {cells}/uL — ABNORMAL LOW (ref 850–3900)
Absolute Monocytes: 468 {cells}/uL (ref 200–950)
Basophils Absolute: 51 {cells}/uL (ref 0–200)
Basophils Relative: 1.7 %
Eosinophils Absolute: 60 {cells}/uL (ref 15–500)
Eosinophils Relative: 2 %
HCT: 38.6 % (ref 35.9–46.0)
Hemoglobin: 12.1 g/dL (ref 11.7–15.5)
MCH: 27.4 pg (ref 27.0–33.0)
MCHC: 31.3 g/dL — ABNORMAL LOW (ref 31.6–35.4)
MCV: 87.3 fL (ref 81.4–101.7)
MPV: 12.6 fL — ABNORMAL HIGH (ref 7.5–12.5)
Monocytes Relative: 15.6 %
Neutro Abs: 1665 {cells}/uL (ref 1500–7800)
Neutrophils Relative %: 55.5 %
Platelets: 181 Thousand/uL (ref 140–400)
RBC: 4.42 Million/uL (ref 3.80–5.10)
RDW: 13.5 % (ref 11.0–15.0)
Total Lymphocyte: 25.2 %
WBC: 3 Thousand/uL — ABNORMAL LOW (ref 3.8–10.8)

## 2024-04-03 LAB — SEDIMENTATION RATE: Sed Rate: 9 mm/h (ref 0–30)

## 2024-04-03 LAB — ANTI-DNA ANTIBODY, DOUBLE-STRANDED: ds DNA Ab: 4 [IU]/mL

## 2024-04-03 NOTE — Progress Notes (Signed)
 No proteinuria  CMP WNL ESR WNL  WBC count is low-3.0-stable. Absolute lymphocytes are low.   Complements WNL

## 2024-04-04 NOTE — Progress Notes (Signed)
 dsDNA is negative-great news.  No signs of a flare

## 2024-04-11 ENCOUNTER — Ambulatory Visit: Admitting: Family Medicine

## 2024-04-30 ENCOUNTER — Other Ambulatory Visit: Payer: Self-pay | Admitting: Physician Assistant

## 2024-04-30 DIAGNOSIS — M3219 Other organ or system involvement in systemic lupus erythematosus: Secondary | ICD-10-CM

## 2024-04-30 NOTE — Telephone Encounter (Signed)
 Last Fill: 10/31/2023  Labs: 04/02/2024 No proteinuria  CMP WNL ESR WNL  WBC count is low-3.0-stable. Absolute lymphocytes are low.   Complements WNL dsDNA is negative-great news. No signs of a flare   Next Visit: 09/02/2024  Last Visit: 04/02/2024  DX: Other systemic lupus erythematosus with other organ involvement   Current Dose per office note on 04/02/2024: Imuran  50 mg 1 tablet daily   Okay to refill Imuran ?

## 2024-05-16 ENCOUNTER — Ambulatory Visit: Payer: Self-pay | Admitting: Family Medicine

## 2024-09-02 ENCOUNTER — Ambulatory Visit: Admitting: Physician Assistant

## 2025-02-25 ENCOUNTER — Ambulatory Visit
